# Patient Record
Sex: Female | Born: 1960 | Race: White | Hispanic: No | Smoking: Never smoker
Health system: Southern US, Community
[De-identification: ages and names within clinical notes are randomized; demographics above are authoritative.]

## PROBLEM LIST (undated history)

## (undated) DIAGNOSIS — C801 Malignant (primary) neoplasm, unspecified: Secondary | ICD-10-CM

## (undated) DIAGNOSIS — K5790 Diverticulosis of intestine, part unspecified, without perforation or abscess without bleeding: Secondary | ICD-10-CM

## (undated) DIAGNOSIS — K219 Gastro-esophageal reflux disease without esophagitis: Secondary | ICD-10-CM

## (undated) DIAGNOSIS — E7800 Pure hypercholesterolemia, unspecified: Secondary | ICD-10-CM

## (undated) DIAGNOSIS — I10 Essential (primary) hypertension: Secondary | ICD-10-CM

## (undated) DIAGNOSIS — R011 Cardiac murmur, unspecified: Secondary | ICD-10-CM

## (undated) DIAGNOSIS — T7840XA Allergy, unspecified, initial encounter: Secondary | ICD-10-CM

## (undated) HISTORY — DX: Cardiac murmur, unspecified: R01.1

## (undated) HISTORY — DX: Allergy, unspecified, initial encounter: T78.40XA

---

## 1989-06-21 HISTORY — PX: TUBAL LIGATION: SHX77

## 2004-07-20 ENCOUNTER — Ambulatory Visit: Payer: Self-pay | Admitting: Obstetrics and Gynecology

## 2005-10-25 ENCOUNTER — Ambulatory Visit: Payer: Self-pay | Admitting: Obstetrics and Gynecology

## 2006-11-17 ENCOUNTER — Ambulatory Visit: Payer: Self-pay | Admitting: Obstetrics and Gynecology

## 2007-12-07 ENCOUNTER — Ambulatory Visit: Payer: Self-pay | Admitting: Obstetrics and Gynecology

## 2008-04-14 ENCOUNTER — Ambulatory Visit: Payer: Self-pay | Admitting: Family Medicine

## 2008-11-12 ENCOUNTER — Ambulatory Visit: Payer: Self-pay | Admitting: Internal Medicine

## 2008-12-09 ENCOUNTER — Ambulatory Visit: Payer: Self-pay | Admitting: Obstetrics and Gynecology

## 2009-03-23 ENCOUNTER — Ambulatory Visit: Payer: Self-pay | Admitting: Family Medicine

## 2009-06-12 ENCOUNTER — Ambulatory Visit: Payer: Self-pay | Admitting: Internal Medicine

## 2009-06-21 HISTORY — PX: THROAT SURGERY: SHX803

## 2009-06-29 ENCOUNTER — Emergency Department: Payer: Self-pay | Admitting: Emergency Medicine

## 2009-07-22 HISTORY — PX: ESOPHAGEAL DILATION: SHX303

## 2009-07-28 ENCOUNTER — Ambulatory Visit: Payer: Self-pay | Admitting: Gastroenterology

## 2009-12-11 ENCOUNTER — Ambulatory Visit: Payer: Self-pay | Admitting: Obstetrics and Gynecology

## 2009-12-29 ENCOUNTER — Ambulatory Visit: Payer: Self-pay | Admitting: Obstetrics and Gynecology

## 2010-02-04 ENCOUNTER — Ambulatory Visit: Payer: Self-pay | Admitting: Family Medicine

## 2010-04-12 ENCOUNTER — Ambulatory Visit: Payer: Self-pay | Admitting: Internal Medicine

## 2010-05-18 ENCOUNTER — Ambulatory Visit: Payer: Self-pay | Admitting: Gastroenterology

## 2010-05-27 ENCOUNTER — Ambulatory Visit: Payer: Self-pay | Admitting: Gastroenterology

## 2010-06-21 HISTORY — PX: CHOLECYSTECTOMY: SHX55

## 2010-08-31 ENCOUNTER — Ambulatory Visit: Payer: Self-pay | Admitting: Internal Medicine

## 2010-09-03 ENCOUNTER — Ambulatory Visit: Payer: Self-pay | Admitting: Internal Medicine

## 2010-09-08 ENCOUNTER — Ambulatory Visit: Payer: Self-pay | Admitting: Internal Medicine

## 2010-09-21 ENCOUNTER — Ambulatory Visit: Payer: Self-pay | Admitting: Internal Medicine

## 2010-12-16 ENCOUNTER — Ambulatory Visit: Payer: Self-pay | Admitting: Obstetrics and Gynecology

## 2010-12-28 ENCOUNTER — Ambulatory Visit: Payer: Self-pay | Admitting: Surgery

## 2010-12-29 LAB — PATHOLOGY REPORT

## 2011-03-21 ENCOUNTER — Ambulatory Visit: Payer: Self-pay

## 2011-12-17 ENCOUNTER — Ambulatory Visit: Payer: Self-pay | Admitting: Obstetrics and Gynecology

## 2012-12-27 ENCOUNTER — Ambulatory Visit: Payer: Self-pay | Admitting: Obstetrics and Gynecology

## 2013-03-08 ENCOUNTER — Ambulatory Visit: Payer: Self-pay | Admitting: Medical

## 2013-03-12 ENCOUNTER — Ambulatory Visit: Payer: Self-pay | Admitting: Medical

## 2013-12-06 LAB — TSH: TSH: 2.98 u[IU]/mL (ref <=5.90)

## 2013-12-06 LAB — LIPID PANEL
Cholesterol: 198 mg/dL (ref 0–200)
HDL: 68 mg/dL (ref 35–70)
LDL Cholesterol: 89 mg/dL
Triglycerides: 203 mg/dL — AB (ref 40–160)

## 2013-12-06 LAB — BASIC METABOLIC PANEL
BUN: 13 mg/dL (ref 4–21)
Creatinine: 0.8 mg/dL (ref <=1.1)

## 2014-01-02 ENCOUNTER — Ambulatory Visit: Payer: Self-pay | Admitting: Obstetrics and Gynecology

## 2014-01-18 ENCOUNTER — Ambulatory Visit: Payer: Self-pay | Admitting: Medical

## 2014-08-25 ENCOUNTER — Ambulatory Visit: Payer: Self-pay | Admitting: Registered Nurse

## 2014-11-28 ENCOUNTER — Other Ambulatory Visit: Payer: Self-pay

## 2014-11-28 ENCOUNTER — Encounter: Payer: Self-pay | Admitting: Internal Medicine

## 2014-11-28 DIAGNOSIS — K5730 Diverticulosis of large intestine without perforation or abscess without bleeding: Secondary | ICD-10-CM | POA: Insufficient documentation

## 2014-11-28 DIAGNOSIS — I7300 Raynaud's syndrome without gangrene: Secondary | ICD-10-CM | POA: Insufficient documentation

## 2014-11-28 DIAGNOSIS — J3089 Other allergic rhinitis: Secondary | ICD-10-CM | POA: Insufficient documentation

## 2014-11-28 DIAGNOSIS — M19049 Primary osteoarthritis, unspecified hand: Secondary | ICD-10-CM | POA: Insufficient documentation

## 2014-11-28 DIAGNOSIS — G5600 Carpal tunnel syndrome, unspecified upper limb: Secondary | ICD-10-CM | POA: Insufficient documentation

## 2014-11-28 DIAGNOSIS — I10 Essential (primary) hypertension: Secondary | ICD-10-CM | POA: Insufficient documentation

## 2014-11-28 DIAGNOSIS — I8390 Asymptomatic varicose veins of unspecified lower extremity: Secondary | ICD-10-CM

## 2014-11-28 DIAGNOSIS — I83813 Varicose veins of bilateral lower extremities with pain: Secondary | ICD-10-CM | POA: Insufficient documentation

## 2014-11-28 DIAGNOSIS — E785 Hyperlipidemia, unspecified: Secondary | ICD-10-CM | POA: Insufficient documentation

## 2014-11-28 DIAGNOSIS — K635 Polyp of colon: Secondary | ICD-10-CM | POA: Insufficient documentation

## 2014-11-29 ENCOUNTER — Encounter: Payer: Self-pay | Admitting: Internal Medicine

## 2014-11-29 ENCOUNTER — Other Ambulatory Visit: Payer: Self-pay

## 2014-11-29 ENCOUNTER — Ambulatory Visit (INDEPENDENT_AMBULATORY_CARE_PROVIDER_SITE_OTHER): Payer: BC Managed Care – PPO | Admitting: Internal Medicine

## 2014-11-29 VITALS — BP 96/60 | HR 64 | Ht 65.0 in | Wt 170.4 lb

## 2014-11-29 DIAGNOSIS — E785 Hyperlipidemia, unspecified: Secondary | ICD-10-CM

## 2014-11-29 DIAGNOSIS — I10 Essential (primary) hypertension: Secondary | ICD-10-CM

## 2014-11-29 DIAGNOSIS — M722 Plantar fascial fibromatosis: Secondary | ICD-10-CM

## 2014-11-29 DIAGNOSIS — E559 Vitamin D deficiency, unspecified: Secondary | ICD-10-CM | POA: Diagnosis not present

## 2014-11-29 DIAGNOSIS — N289 Disorder of kidney and ureter, unspecified: Secondary | ICD-10-CM | POA: Diagnosis not present

## 2014-11-29 HISTORY — DX: Plantar fascial fibromatosis: M72.2

## 2014-11-29 NOTE — Progress Notes (Signed)
Date:  11/29/2014   Name:  Holly Villegas   DOB:  [birth year]   MRN:  [id number]   Chief Complaint: Medication Refill   History of Present Illness:  This is a 54 y.o. female who is presenting for lab monitoring due to medication prescribed by podiatry. Leg Pain  There was no injury mechanism (chronic plantar fasciitis). The pain is present in the left leg and right leg. The quality of the pain is described as shooting and stabbing. The pain is at a severity of 3/10. The pain is moderate. The pain has been improving since onset. She has tried NSAIDs (Podiatry does not want to prescribe etodolac ongoing without lab monitoring.  Her appt is next week.) for the symptoms. The treatment provided moderate relief.  Hypertension This is a chronic problem. The current episode started more than 1 year ago. The problem is unchanged. The problem is controlled. Pertinent negatives include no chest pain, headaches or shortness of breath. There are no known risk factors for coronary artery disease. Past treatments include ACE inhibitors and diuretics. There are no compliance problems.  There is no history of chronic renal disease.  Hyperlipidemia This is a chronic problem. The problem is controlled. Recent lipid tests were reviewed and are normal. She has no history of chronic renal disease, diabetes or hypothyroidism. There are no known factors aggravating her hyperlipidemia. Associated symptoms include leg pain. Pertinent negatives include no chest pain, focal weakness, myalgias or shortness of breath. Current antihyperlipidemic treatment includes statins. The current treatment provides significant improvement of lipids. There are no compliance problems.    Review of Systems:  Review of Systems  Constitutional: Negative for fever, appetite change and unexpected weight change.  Respiratory: Negative for cough and shortness of breath.   Cardiovascular: Negative for chest pain and leg swelling.   Gastrointestinal: Negative for nausea, abdominal pain and blood in stool.  Musculoskeletal: Positive for arthralgias (bilateral feet). Negative for myalgias.  Neurological: Negative for dizziness, focal weakness and headaches.  Hematological: Does not bruise/bleed easily.  Psychiatric/Behavioral: Negative for dysphoric mood.    Patient Active Problem List   Diagnosis Date Noted  . Plantar fasciitis, bilateral 11/29/2014  . Vitamin D deficiency 11/29/2014  . Dyslipidemia 11/28/2014  . Varicose veins 11/28/2014  . Environmental and seasonal allergies 11/28/2014  . Raynaud's syndrome without gangrene 11/28/2014  . Carpal tunnel syndrome 11/28/2014  . Benign colonic polyp 11/28/2014  . Diverticulosis of colon without diverticulitis 11/28/2014  . Essential hypertension 11/28/2014  . Impaired renal function 11/28/2014    Prior to Admission medications   Medication Sig Start Date End Date Taking? Authorizing Provider  atorvastatin (LIPITOR) 20 MG tablet Take 1 tablet by mouth daily. 04/12/14   Historical Provider, MD  azelastine (ASTELIN) 0.1 % nasal spray Place 1 spray into the nose daily. 08/12/14   Historical Provider, MD  esomeprazole (NEXIUM) 40 MG capsule Take 1 tablet by mouth daily. 06/27/13   Historical Provider, MD  etodolac (LODINE XL) 400 MG 24 hr tablet Take 1 tablet by mouth daily.    Historical Provider, MD  fluticasone (FLONASE) 50 MCG/ACT nasal spray Place 2 sprays into the nose daily. 12/06/13   Historical Provider, MD  lisinopril-hydrochlorothiazide (PRINZIDE,ZESTORETIC) 20-25 MG per tablet Take 1 tablet by mouth daily. 12/31/13   Historical Provider, MD  Norethindrone-Ethinyl Estradiol-Fe Biphas (LO LOESTRIN FE) 1 MG-10 MCG / 10 MCG tablet Take 1 tablet by mouth daily.    Historical Provider, MD  Vitamin D, Ergocalciferol, (DRISDOL) 50000 UNITS  CAPS capsule Take by mouth.    Historical Provider, MD    Allergies  Allergen Reactions  . Codeine Sulfate Nausea And Vomiting   . Shellfish-Derived Products   . Sulfa Antibiotics Nausea And Vomiting    Past Surgical History  Procedure Laterality Date  . Throat surgery  2011    polyp removed from vocal cord  . Esophageal dilation  07/2009  . Cholecystectomy  2012  . Tubal ligation  1991    History  Substance Use Topics  . Smoking status: Never Smoker   . Smokeless tobacco: Not on file  . Alcohol Use: 0.0 oz/week    0 Standard drinks or equivalent per week     Comment: social   Lab Results  Component Value Date   CREATININE 0.8 12/06/2013     Medication list has been reviewed and updated.  Physical Examination:  Physical Exam  Constitutional: She appears well-developed and well-nourished.  Eyes: Conjunctivae are normal.  Neck: Normal range of motion. Neck supple. No thyromegaly present.  Cardiovascular: Normal rate, regular rhythm and normal heart sounds.   Pulmonary/Chest: Breath sounds normal. She has no wheezes.  Abdominal: Soft. Bowel sounds are normal. She exhibits no mass. There is no tenderness. There is no guarding.  Musculoskeletal: She exhibits no edema.  Lymphadenopathy:    She has no cervical adenopathy.  Skin: Skin is warm.  Psychiatric: She has a normal mood and affect. Her behavior is normal. Thought content normal.  Nursing note and vitals reviewed.   BP 96/60 mmHg  Pulse 64  Ht 5\' 5"  (1.651 m)  Wt 170 lb 6.4 oz (77.293 kg)  BMI 28.36 kg/m2  Assessment and Plan:  1. Plantar fasciitis, bilateral Can continue current medication  2. Dyslipidemia On statin therapy - Lipid panel  3. Impaired renal function Noted in the past - will recheck labs before continuing nsaids - Comprehensive metabolic panel  4. Essential hypertension The current medical regimen is effective;  continue present plan and medications.  - CBC with Differential/Platelet  5. Vitamin D deficiency Hx of very low reading - will advise on supplements when resulted - Vitamin D 1,25  dihydroxy   Halina Maidens, MD [facility]  11/29/2014

## 2014-11-30 LAB — COMPREHENSIVE METABOLIC PANEL
ALT: 13 IU/L (ref 0–32)
AST: 16 IU/L (ref 0–40)
Albumin/Globulin Ratio: 1.9 (ref 1.1–2.5)
Albumin: 4.5 g/dL (ref 3.5–5.5)
Alkaline Phosphatase: 73 IU/L (ref 39–117)
BUN/Creatinine Ratio: 23 (ref 9–23)
BUN: 18 mg/dL (ref 6–24)
Bilirubin Total: 0.3 mg/dL (ref 0.0–1.2)
CHLORIDE: 98 mmol/L (ref 97–108)
CO2: 24 mmol/L (ref 18–29)
Calcium: 9.5 mg/dL (ref 8.7–10.2)
Creatinine, Ser: 0.8 mg/dL (ref 0.57–1.00)
GFR, EST AFRICAN AMERICAN: 95 mL/min/{1.73_m2} (ref >59)
GFR, EST NON AFRICAN AMERICAN: 83 mL/min/{1.73_m2} (ref >59)
GLOBULIN, TOTAL: 2.4 g/dL (ref 1.5–4.5)
Glucose: 97 mg/dL (ref 65–99)
POTASSIUM: 4.3 mmol/L (ref 3.5–5.2)
Sodium: 139 mmol/L (ref 134–144)
TOTAL PROTEIN: 6.9 g/dL (ref 6.0–8.5)

## 2014-11-30 LAB — LIPID PANEL
CHOL/HDL RATIO: 2.3 ratio (ref 0.0–4.4)
CHOLESTEROL TOTAL: 183 mg/dL (ref 100–199)
HDL: 79 mg/dL (ref >39)
LDL Calculated: 81 mg/dL (ref 0–99)
TRIGLYCERIDES: 114 mg/dL (ref 0–149)
VLDL Cholesterol Cal: 23 mg/dL (ref 5–40)

## 2014-11-30 LAB — CBC WITH DIFFERENTIAL/PLATELET
BASOS ABS: 0.1 10*3/uL (ref 0.0–0.2)
Basos: 1 %
EOS (ABSOLUTE): 0.1 10*3/uL (ref 0.0–0.4)
EOS: 1 %
Hematocrit: 41.1 % (ref 34.0–46.6)
Hemoglobin: 13.7 g/dL (ref 11.1–15.9)
IMMATURE GRANULOCYTES: 0 %
Immature Grans (Abs): 0 10*3/uL (ref 0.0–0.1)
LYMPHS: 39 %
Lymphocytes Absolute: 2.4 10*3/uL (ref 0.7–3.1)
MCH: 29.5 pg (ref 26.6–33.0)
MCHC: 33.3 g/dL (ref 31.5–35.7)
MCV: 88 fL (ref 79–97)
Monocytes Absolute: 0.5 10*3/uL (ref 0.1–0.9)
Monocytes: 8 %
Neutrophils Absolute: 3 10*3/uL (ref 1.4–7.0)
Neutrophils: 51 %
Platelets: 311 10*3/uL (ref 150–379)
RBC: 4.65 x10E6/uL (ref 3.77–5.28)
RDW: 13.2 % (ref 12.3–15.4)
WBC: 6 10*3/uL (ref 3.4–10.8)

## 2014-12-06 LAB — VITAMIN D 1,25 DIHYDROXY
Vitamin D 1, 25 (OH)2 Total: 42 pg/mL
Vitamin D2 1, 25 (OH)2: <10 pg/mL
Vitamin D3 1, 25 (OH)2: 42 pg/mL

## 2014-12-11 ENCOUNTER — Other Ambulatory Visit: Payer: Self-pay | Admitting: Obstetrics and Gynecology

## 2014-12-11 DIAGNOSIS — Z1231 Encounter for screening mammogram for malignant neoplasm of breast: Secondary | ICD-10-CM

## 2014-12-16 ENCOUNTER — Encounter: Payer: BC Managed Care – PPO | Admitting: Internal Medicine

## 2015-01-14 ENCOUNTER — Ambulatory Visit: Payer: Self-pay

## 2015-01-16 ENCOUNTER — Ambulatory Visit
Admission: RE | Admit: 2015-01-16 | Discharge: 2015-01-16 | Disposition: A | Discharge status: Home / Self Care | Payer: BC Managed Care – PPO | Source: Ambulatory Visit | Attending: Obstetrics and Gynecology | Admitting: Obstetrics and Gynecology

## 2015-01-16 DIAGNOSIS — Z1231 Encounter for screening mammogram for malignant neoplasm of breast: Secondary | ICD-10-CM | POA: Diagnosis not present

## 2015-01-31 ENCOUNTER — Other Ambulatory Visit: Payer: Self-pay | Admitting: Internal Medicine

## 2015-03-14 ENCOUNTER — Ambulatory Visit (INDEPENDENT_AMBULATORY_CARE_PROVIDER_SITE_OTHER): Payer: BC Managed Care – PPO | Admitting: Internal Medicine

## 2015-03-14 ENCOUNTER — Encounter: Payer: Self-pay | Admitting: Internal Medicine

## 2015-03-14 VITALS — BP 104/62 | HR 60 | Ht 65.0 in | Wt 183.2 lb

## 2015-03-14 DIAGNOSIS — J0100 Acute maxillary sinusitis, unspecified: Secondary | ICD-10-CM | POA: Diagnosis not present

## 2015-03-14 MED ORDER — CETIRIZINE HCL 10 MG PO TABS: 10.0000 mg | ORAL_TABLET | Freq: Every day | ORAL | Status: DC

## 2015-03-14 MED ORDER — AMOXICILLIN-POT CLAVULANATE 875-125 MG PO TABS: 1.0000 | ORAL_TABLET | Freq: Two times a day (BID) | ORAL | Status: DC

## 2015-03-14 NOTE — Progress Notes (Signed)
Date:  03/14/2015   Name:  Holly Villegas   DOB:  Feb 03, 1961   MRN:  [id number]   Chief Complaint: Sinusitis  Patient has onset of sinus pressure and drainage about 1 week ago. She frequently gets sinus infections and this feels similar. She's been using nasal spray and Mucinex. 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 She complaining of sinus pressure and ear pressure sore throat and postnasal drip and nonproductive cough. Nasal discharge is thick and green. Her sleep is disturbed by cough and postnasal drainage.  Review of Systems:  Review of Systems  Constitutional: Positive for fatigue. Negative for fever.  HENT: Positive for congestion, postnasal drip, sinus pressure and sore throat.   Respiratory: Positive for cough. Negative for chest tightness and wheezing.   Cardiovascular: Negative for chest pain and palpitations.  Neurological: Positive for headaches. Negative for dizziness.    Patient Active Problem List   Diagnosis Date Noted  . Plantar fasciitis, bilateral 11/29/2014  . Vitamin D deficiency 11/29/2014  . Dyslipidemia 11/28/2014  . Varicose veins 11/28/2014  . Environmental and seasonal allergies 11/28/2014  . Raynaud's syndrome without gangrene 11/28/2014  . Carpal tunnel syndrome 11/28/2014  . Benign colonic polyp 11/28/2014  . Diverticulosis of colon without diverticulitis 11/28/2014  . Essential hypertension 11/28/2014  . Impaired renal function 11/28/2014    Prior to Admission medications   Medication Sig Start Date End Date Taking? Authorizing Provider  acetaminophen (TYLENOL) 500 MG tablet Take 1 tablet by mouth once as needed.   Yes Historical Provider, MD  atorvastatin (LIPITOR) 20 MG tablet Take 1 tablet by mouth daily. 04/12/14  Yes Historical Provider, MD  azelastine (ASTELIN) 0.1 % nasal spray Place 1 spray into the nose daily. 08/12/14  Yes Historical Provider, MD  esomeprazole (NEXIUM) 40 MG capsule Take 1  tablet by mouth daily. 06/27/13  Yes Historical Provider, MD  etodolac (LODINE XL) 400 MG 24 hr tablet Take 1 tablet by mouth daily.   Yes Historical Provider, MD  fluticasone (FLONASE) 50 MCG/ACT nasal spray Place 2 sprays into the nose daily. 12/06/13  Yes Historical Provider, MD  lisinopril-hydrochlorothiazide (PRINZIDE,ZESTORETIC) 20-25 MG per tablet TAKE (1) TABLET BY MOUTH EVERY DAY 02/01/15  Yes Glean Hess, MD  Multiple Vitamin (MULTI-VITAMINS) TABS Take 1 tablet by mouth daily.   Yes Historical Provider, MD  Norethindrone-Ethinyl Estradiol-Fe Biphas (LO LOESTRIN FE) 1 MG-10 MCG / 10 MCG tablet Take 1 tablet by mouth daily.    Historical Provider, MD    Allergies  Allergen Reactions  . Codeine Sulfate Nausea And Vomiting  . Shellfish-Derived Products   . Sulfa Antibiotics Nausea And Vomiting    Past Surgical History  Procedure Laterality Date  . Throat surgery  2011    polyp removed from vocal cord  . Esophageal dilation  07/2009  . Cholecystectomy  2012  . Tubal ligation  1991    Social History  Substance Use Topics  . Smoking status: Never Smoker   . Smokeless tobacco: None  . Alcohol Use: 0.0 oz/week    0 Standard drinks or equivalent per week     Comment: social     Medication list has been reviewed and updated.  Physical Examination:  Physical Exam  Constitutional: She is oriented to person, place, and time. Vital signs are normal. She appears well-developed and well-nourished. She has a sickly appearance.  HENT:  Right Ear: External ear normal.  Left Ear: External ear and ear canal  normal. Tympanic membrane is retracted. Tympanic membrane is not erythematous.  Nose: Right sinus exhibits maxillary sinus tenderness and frontal sinus tenderness. Left sinus exhibits maxillary sinus tenderness and frontal sinus tenderness.  Mouth/Throat: Uvula is midline and mucous membranes are normal. No oral lesions. Posterior oropharyngeal erythema present. No oropharyngeal  exudate.  Cardiovascular: Normal rate, regular rhythm and normal heart sounds.   Pulmonary/Chest: Effort normal. She has decreased breath sounds in the right upper field and the left upper field. She has no wheezes. She has no rales.  Lymphadenopathy:    She has no cervical adenopathy.  Neurological: She is alert and oriented to person, place, and time.    BP 104/62 mmHg  Pulse 60  Ht 5\' 5"  (1.651 m)  Wt 183 lb 3.2 oz (83.099 kg)  BMI 30.49 kg/m2  Assessment and Plan: 1. Acute maxillary sinusitis, recurrence not specified Continue nasal spray, fluids, and over-the-counter cough syrup resume Zyrtec 1 tab daily for 5-7 days - amoxicillin-clavulanate (AUGMENTIN) 875-125 MG per tablet; Take 1 tablet by mouth 2 (two) times daily.  Dispense: 20 tablet; Refill: 0 - cetirizine (ZYRTEC) 10 MG tablet; Take 1 tablet (10 mg total) by mouth daily.  Dispense: 30 tablet; Refill: [place], MD [facility]  03/14/2015

## 2015-05-02 ENCOUNTER — Other Ambulatory Visit: Payer: Self-pay | Admitting: Internal Medicine

## 2015-05-14 ENCOUNTER — Ambulatory Visit (INDEPENDENT_AMBULATORY_CARE_PROVIDER_SITE_OTHER): Payer: BC Managed Care – PPO | Admitting: Internal Medicine

## 2015-05-14 ENCOUNTER — Encounter: Payer: Self-pay | Admitting: Internal Medicine

## 2015-05-14 VITALS — BP 104/76 | HR 72 | Ht 65.0 in | Wt 189.6 lb

## 2015-05-14 DIAGNOSIS — N289 Disorder of kidney and ureter, unspecified: Secondary | ICD-10-CM

## 2015-05-14 DIAGNOSIS — E559 Vitamin D deficiency, unspecified: Secondary | ICD-10-CM | POA: Diagnosis not present

## 2015-05-14 DIAGNOSIS — E785 Hyperlipidemia, unspecified: Secondary | ICD-10-CM

## 2015-05-14 DIAGNOSIS — I10 Essential (primary) hypertension: Secondary | ICD-10-CM

## 2015-05-14 NOTE — Progress Notes (Signed)
Date:  05/14/2015   Name:  Holly Villegas   DOB:  May 17, 1961   MRN:  [id number]   Chief Complaint: Hyperlipidemia and Hypertension Hyperlipidemia This is a chronic (has not has labs in 18 months) problem. The current episode started more than 1 year ago. The problem is controlled. Recent lipid tests were reviewed and are normal. Pertinent negatives include no chest pain, focal weakness, leg pain, myalgias or shortness of breath. Current antihyperlipidemic treatment includes statins. The current treatment provides significant improvement of lipids. There are no compliance problems.   Hypertension This is a chronic problem. The current episode started more than 1 year ago. The problem is unchanged. The problem is controlled. Pertinent negatives include no chest pain, headaches, palpitations or shortness of breath. The current treatment provides significant improvement. There are no compliance problems.   Gastroesophageal Reflux She complains of heartburn. She reports no abdominal pain, no chest pain, no hoarse voice, no nausea, no sore throat or no wheezing. This is a chronic problem. The problem occurs rarely. The problem has been unchanged. The heartburn is located in the substernum. The heartburn does not wake her from sleep. The heartburn does not limit her activity. The heartburn doesn't change with position. Pertinent negatives include no fatigue. She has tried a PPI for the symptoms. The treatment provided significant relief.   low vitamin D - patient history of very low vitamin D levels. She took high-dose supplements weekly for a number of months. Recheck level showed that these were normal but she did not continue a daily maintenance dose.   Review of Systems  Constitutional: Negative for fever, chills and fatigue.  HENT: Negative for ear pain, hoarse voice, sore throat, tinnitus and trouble swallowing.   Eyes: Negative for visual disturbance.  Respiratory: Negative for chest  tightness, shortness of breath and wheezing.   Cardiovascular: Negative for chest pain and palpitations.  Gastrointestinal: Positive for heartburn. Negative for nausea and abdominal pain.  Genitourinary: Negative for dysuria.  Musculoskeletal: Negative for myalgias.  Skin: Negative for rash.  Allergic/Immunologic: Negative for food allergies.  Neurological: Negative for dizziness, focal weakness, numbness and headaches.  Psychiatric/Behavioral: Negative for sleep disturbance and dysphoric mood.    Patient Active Problem List   Diagnosis Date Noted  . Plantar fasciitis, bilateral 11/29/2014  . Vitamin D deficiency 11/29/2014  . Dupuytren's contracture of foot 11/29/2014  . Dyslipidemia 11/28/2014  . Varicose veins 11/28/2014  . Environmental and seasonal allergies 11/28/2014  . Raynaud's syndrome without gangrene 11/28/2014  . Carpal tunnel syndrome 11/28/2014  . Benign colonic polyp 11/28/2014  . Diverticulosis of colon without diverticulitis 11/28/2014  . Essential hypertension 11/28/2014  . Impaired renal function 11/28/2014  . Paroxysmal digital cyanosis 11/28/2014  . Other allergic rhinitis 11/28/2014  . Colon, diverticulosis 11/28/2014  . Asymptomatic varicose veins 11/28/2014    Prior to Admission medications   Medication Sig Start Date End Date Taking? Authorizing Provider  acetaminophen (TYLENOL) 500 MG tablet Take 1 tablet by mouth once as needed.   Yes Historical Provider, MD  atorvastatin (LIPITOR) 20 MG tablet TAKE (1) TABLET BY MOUTH EVERY DAY 05/02/15  Yes Glean Hess, MD  cetirizine (ZYRTEC) 10 MG tablet Take 1 tablet (10 mg total) by mouth daily. 03/14/15  Yes Glean Hess, MD  esomeprazole (NEXIUM) 40 MG capsule Take 1 tablet by mouth daily. 06/27/13  Yes Historical Provider, MD  fluticasone (FLONASE) 50 MCG/ACT nasal spray Place 2 sprays into the nose daily. 12/06/13  Yes Historical Provider,  MD  lisinopril-hydrochlorothiazide (PRINZIDE,ZESTORETIC) 20-25  MG tablet TAKE ONE (1) TABLET BY MOUTH ONCE DAILY 05/02/15  Yes Glean Hess, MD  Multiple Vitamin (MULTI-VITAMINS) TABS Take 1 tablet by mouth daily.   Yes Historical Provider, MD  azelastine (ASTELIN) 0.1 % nasal spray Place 1 spray into the nose daily. 08/12/14   Historical Provider, MD  Norethindrone-Ethinyl Estradiol-Fe Biphas (LO LOESTRIN FE) 1 MG-10 MCG / 10 MCG tablet Take 1 tablet by mouth daily.    Historical Provider, MD    Allergies  Allergen Reactions  . Codeine Sulfate Nausea And Vomiting  . Shellfish-Derived Products   . Sulfa Antibiotics Nausea And Vomiting    Past Surgical History  Procedure Laterality Date  . Throat surgery  2011    polyp removed from vocal cord  . Esophageal dilation  07/2009  . Cholecystectomy  2012  . Tubal ligation  1991    Social History  Substance Use Topics  . Smoking status: Never Smoker   . Smokeless tobacco: None  . Alcohol Use: 0.0 oz/week    0 Standard drinks or equivalent per week     Comment: social    Medication list has been reviewed and updated.   Physical Exam  Constitutional: She is oriented to person, place, and time. She appears well-developed and well-nourished. No distress.  HENT:  Head: Normocephalic and atraumatic.  Eyes: Conjunctivae are normal. Right eye exhibits no discharge. Left eye exhibits no discharge. No scleral icterus.  Cardiovascular: Normal rate, regular rhythm and normal heart sounds.   Pulmonary/Chest: Effort normal and breath sounds normal. No respiratory distress.  Abdominal: Soft. Normal appearance and bowel sounds are normal. There is no hepatosplenomegaly. There is no tenderness.  Musculoskeletal: Normal range of motion. She exhibits no edema.  Neurological: She is alert and oriented to person, place, and time.  Skin: Skin is warm and dry. No rash noted.  Psychiatric: She has a normal mood and affect. Her behavior is normal. Thought content normal.    BP 104/76 mmHg  Pulse 72  Ht 5\' 5"   (Q000111Q m)  Wt 189 lb 9.6 oz (86.002 kg)  BMI 31.55 kg/m2  Assessment and Plan: 1. Essential hypertension Well-controlled - CBC with Differential/Platelet - Comprehensive metabolic panel  2. Dyslipidemia Continue statin - Lipid panel - TSH  3. Impaired renal function Will monitor regularly - Comprehensive metabolic panel  4. Vitamin D deficiency Check level and advise on resumption of supplements - Vitamin D 1,25 dihydroxy   Halina Maidens, MD [facility]  05/14/2015

## 2015-05-15 LAB — CBC WITH DIFFERENTIAL/PLATELET
BASOS ABS: 0.1 10*3/uL (ref 0.0–0.2)
Basos: 1 %
EOS (ABSOLUTE): 0.2 10*3/uL (ref 0.0–0.4)
Eos: 3 %
HEMOGLOBIN: 13 g/dL (ref 11.1–15.9)
Hematocrit: 38.3 % (ref 34.0–46.6)
IMMATURE GRANS (ABS): 0 10*3/uL (ref 0.0–0.1)
IMMATURE GRANULOCYTES: 0 %
Lymphocytes Absolute: 2.7 10*3/uL (ref 0.7–3.1)
Lymphs: 33 %
MCH: 30.4 pg (ref 26.6–33.0)
MCHC: 33.9 g/dL (ref 31.5–35.7)
MCV: 90 fL (ref 79–97)
MONOCYTES: 7 %
Monocytes Absolute: 0.6 10*3/uL (ref 0.1–0.9)
NEUTROS PCT: 56 %
Neutrophils Absolute: 4.7 10*3/uL (ref 1.4–7.0)
PLATELETS: 282 10*3/uL (ref 150–379)
RBC: 4.28 x10E6/uL (ref 3.77–5.28)
RDW: 13.4 % (ref 12.3–15.4)
WBC: 8.3 10*3/uL (ref 3.4–10.8)

## 2015-05-15 LAB — COMPREHENSIVE METABOLIC PANEL
ALBUMIN: 4 g/dL (ref 3.5–5.5)
ALT: 12 IU/L (ref 0–32)
AST: 17 IU/L (ref 0–40)
Albumin/Globulin Ratio: 1.5 (ref 1.1–2.5)
Alkaline Phosphatase: 74 IU/L (ref 39–117)
BUN/Creatinine Ratio: 16 (ref 9–23)
BUN: 14 mg/dL (ref 6–24)
Bilirubin Total: 0.2 mg/dL (ref 0.0–1.2)
CO2: 22 mmol/L (ref 18–29)
CREATININE: 0.9 mg/dL (ref 0.57–1.00)
Calcium: 8.7 mg/dL (ref 8.7–10.2)
Chloride: 100 mmol/L (ref 97–106)
GFR calc non Af Amer: 72 mL/min/{1.73_m2} (ref >59)
GFR, EST AFRICAN AMERICAN: 83 mL/min/{1.73_m2} (ref >59)
GLUCOSE: 86 mg/dL (ref 65–99)
Globulin, Total: 2.6 g/dL (ref 1.5–4.5)
Potassium: 4.6 mmol/L (ref 3.5–5.2)
Sodium: 141 mmol/L (ref 136–144)
Total Protein: 6.6 g/dL (ref 6.0–8.5)

## 2015-05-15 LAB — LIPID PANEL
Chol/HDL Ratio: 2.2 ratio units (ref 0.0–4.4)
Cholesterol, Total: 181 mg/dL (ref 100–199)
HDL: 84 mg/dL (ref >39)
LDL CALC: 72 mg/dL (ref 0–99)
Triglycerides: 125 mg/dL (ref 0–149)
VLDL CHOLESTEROL CAL: 25 mg/dL (ref 5–40)

## 2015-05-15 LAB — TSH: TSH: 3.04 u[IU]/mL (ref 0.450–4.500)

## 2015-05-22 LAB — VITAMIN D 1,25 DIHYDROXY
VITAMIN D 1, 25 (OH) TOTAL: 46 pg/mL
Vitamin D2 1, 25 (OH)2: <10 pg/mL
Vitamin D3 1, 25 (OH)2: 41 pg/mL

## 2015-05-24 ENCOUNTER — Ambulatory Visit
Admission: EM | Admit: 2015-05-24 | Discharge: 2015-05-24 | Disposition: A | Discharge status: Home / Self Care | Payer: BC Managed Care – PPO | Attending: Family Medicine | Admitting: Family Medicine

## 2015-05-24 DIAGNOSIS — J0101 Acute recurrent maxillary sinusitis: Secondary | ICD-10-CM | POA: Diagnosis not present

## 2015-05-24 MED ORDER — AMOXICILLIN-POT CLAVULANATE 875-125 MG PO TABS: 1.0000 | ORAL_TABLET | Freq: Two times a day (BID) | ORAL | Status: DC

## 2015-05-24 MED ORDER — BUDESONIDE 32 MCG/ACT NA SUSP: 2.0000 | Freq: Every day | NASAL | Status: DC

## 2015-05-24 NOTE — ED Provider Notes (Signed)
CSN: [id number]     Arrival date & time 05/24/15  [id number] History   First MD Initiated Contact with Patient 05/24/15 [phone]     Chief Complaint  Patient presents with  . Sinus Congestion    HPI  Holly Villegas is a pleasant 54 y.o. female who presents with acute sinus pain and pressure. She states one week ago she developed severe maxillary sinus pain. She has a history of recurrent sinusitis gets infection several times per year. She's not been seen by anterior recently. She has been using an Nettie pot twice daily, Mucinex at least once a day, and NyQuil at night. She complains of sinus congestion and rhinorrhea. She has noticed erythema on her face or in her maxillary sinus area. She is a Radio producer. She also tried ibuprofen which did not help. Pain is 9/10 at worst today. Rest seems to make the pain better. Nothing aggravates the pain.  She denies fever.  Last treatment for sinusitis was in September.  No past medical history on file. Past Surgical History  Procedure Laterality Date  . Throat surgery  2011    polyp removed from vocal cord  . Esophageal dilation  07/2009  . Cholecystectomy  2012  . Tubal ligation  1991   Family History  Problem Relation Age of Onset  . Hypertension Mother   . Migraines Mother   . Peptic Ulcer Disease Father   . Breast cancer Maternal Aunt 46   Social History  Substance Use Topics  . Smoking status: Never Smoker   . Smokeless tobacco: None  . Alcohol Use: 0.0 oz/week    0 Standard drinks or equivalent per week     Comment: social   OB History    No data available     Review of Systems  Constitutional: Positive for diaphoresis, activity change, appetite change and fatigue. Negative for fever and chills.  HENT: Positive for congestion, postnasal drip, rhinorrhea, sinus pressure and sneezing. Negative for ear discharge, ear pain, facial swelling, mouth sores, sore throat and tinnitus.   Eyes: Negative.   Respiratory: Negative.    Cardiovascular: Negative.   Gastrointestinal: Negative.   Endocrine: Negative.   Genitourinary: Negative.   Musculoskeletal: Negative.   Skin: Negative.   Allergic/Immunologic: Negative.   Neurological: Negative.   Hematological: Negative.   Psychiatric/Behavioral: Negative.     Allergies  Codeine sulfate; Shellfish-derived products; and Sulfa antibiotics  Home Medications   Prior to Admission medications   Medication Sig Start Date End Date Taking? Authorizing Provider  acetaminophen (TYLENOL) 500 MG tablet Take 1 tablet by mouth once as needed.   Yes Historical Provider, MD  atorvastatin (LIPITOR) 20 MG tablet TAKE (1) TABLET BY MOUTH EVERY DAY 05/02/15  Yes Glean Hess, MD  cetirizine (ZYRTEC) 10 MG tablet Take 1 tablet (10 mg total) by mouth daily. 03/14/15  Yes Glean Hess, MD  esomeprazole (NEXIUM) 40 MG capsule Take 1 tablet by mouth daily. 06/27/13  Yes Historical Provider, MD  lisinopril-hydrochlorothiazide (PRINZIDE,ZESTORETIC) 20-25 MG tablet TAKE ONE (1) TABLET BY MOUTH ONCE DAILY 05/02/15  Yes Glean Hess, MD  Multiple Vitamin (MULTI-VITAMINS) TABS Take 1 tablet by mouth daily.   Yes Historical Provider, MD  Norethindrone-Ethinyl Estradiol-Fe Biphas (LO LOESTRIN FE) 1 MG-10 MCG / 10 MCG tablet Take 1 tablet by mouth daily.   Yes Historical Provider, MD  amoxicillin-clavulanate (AUGMENTIN) 875-125 MG tablet Take 1 tablet by mouth 2 (two) times daily. 05/24/15   Andria Meuse, NP  budesonide (RHINOCORT AQUA) 32 MCG/ACT nasal spray Place 2 sprays into both nostrils daily. 05/24/15   Andria Meuse, NP   Meds Ordered and Administered this Visit  Medications - No data to display  BP 124/82 mmHg  Pulse 63  Temp(Src) 98 F (36.7 C) (Tympanic)  Resp 17  Ht 5\' 5"  (1.651 m)  Wt 185 lb (83.915 kg)  BMI 30.79 kg/m2  SpO2 100% No data found.   Physical Exam  HENT:  Head: Normocephalic.  Right Ear: Hearing and ear canal normal. Tympanic membrane is  injected, erythematous and bulging.  Left Ear: Hearing and ear canal normal. Tympanic membrane is injected, erythematous and bulging.  Nose: Mucosal edema and rhinorrhea present. Right sinus exhibits maxillary sinus tenderness and frontal sinus tenderness. Left sinus exhibits maxillary sinus tenderness and frontal sinus tenderness.  Mouth/Throat: Uvula is midline, oropharynx is clear and moist and mucous membranes are normal.  Nursing note and vitals reviewed.   ED Course  Procedures n/a  MDM   1. Acute recurrent maxillary sinusitis    Plan: Diagnosis reviewed with patient.  She is advised to follow-up with her PCP in 1 week given recurrent sinusitis and may need referral to otolaryngologist. Rx as per orders;  benefits, risks, potential side effects reviewed  Recommend supportive treatment with Use ibuprofen 600-800mg  with food 3 times daily Use rhinocort spray, mucinex twice daily Rest, plenty fluids      Andria Meuse, NP 05/24/15 [phone]

## 2015-05-24 NOTE — Discharge Instructions (Signed)
Use ibuprofen 600-800mg  with food 3 times daily Use rhinocort spray, mucinex twice daily Rest, plenty fluids  Sinusitis, Adult Sinusitis is redness, soreness, and inflammation of the paranasal sinuses. Paranasal sinuses are air pockets within the bones of your face. They are located beneath your eyes, in the middle of your forehead, and above your eyes. In healthy paranasal sinuses, mucus is able to drain out, and air is able to circulate through them by way of your nose. However, when your paranasal sinuses are inflamed, mucus and air can become trapped. This can allow bacteria and other germs to grow and cause infection. Sinusitis can develop quickly and last only a short time (acute) or continue over a long period (chronic). Sinusitis that lasts for more than 12 weeks is considered chronic. CAUSES Causes of sinusitis include:  Allergies.  Structural abnormalities, such as displacement of the cartilage that separates your nostrils (deviated septum), which can decrease the air flow through your nose and sinuses and affect sinus drainage.  Functional abnormalities, such as when the small hairs (cilia) that line your sinuses and help remove mucus do not work properly or are not present. SIGNS AND SYMPTOMS Symptoms of acute and chronic sinusitis are the same. The primary symptoms are pain and pressure around the affected sinuses. Other symptoms include:  Upper toothache.  Earache.  Headache.  Bad breath.  Decreased sense of smell and taste.  A cough, which worsens when you are lying flat.  Fatigue.  Fever.  Thick drainage from your nose, which often is green and may contain pus (purulent).  Swelling and warmth over the affected sinuses. DIAGNOSIS Your health care provider will perform a physical exam. During your exam, your health care provider may perform any of the following to help determine if you have acute sinusitis or chronic sinusitis:  Look in your nose for signs of  abnormal growths in your nostrils (nasal polyps).  Tap over the affected sinus to check for signs of infection.  View the inside of your sinuses using an imaging device that has a light attached (endoscope). If your health care provider suspects that you have chronic sinusitis, one or more of the following tests may be recommended:  Allergy tests.  Nasal culture. A sample of mucus is taken from your nose, sent to a lab, and screened for bacteria.  Nasal cytology. A sample of mucus is taken from your nose and examined by your health care provider to determine if your sinusitis is related to an allergy. TREATMENT Most cases of acute sinusitis are related to a viral infection and will resolve on their own within 10 days. Sometimes, medicines are prescribed to help relieve symptoms of both acute and chronic sinusitis. These may include pain medicines, decongestants, nasal steroid sprays, or saline sprays. However, for sinusitis related to a bacterial infection, your health care provider will prescribe antibiotic medicines. These are medicines that will help kill the bacteria causing the infection. Rarely, sinusitis is caused by a fungal infection. In these cases, your health care provider will prescribe antifungal medicine. For some cases of chronic sinusitis, surgery is needed. Generally, these are cases in which sinusitis recurs more than 3 times per year, despite other treatments. HOME CARE INSTRUCTIONS  Drink plenty of water. Water helps thin the mucus so your sinuses can drain more easily.  Use a humidifier.  Inhale steam 3-4 times a day (for example, sit in the bathroom with the shower running).  Apply a warm, moist washcloth to your face 3-4  times a day, or as directed by your health care provider.  Use saline nasal sprays to help moisten and clean your sinuses.  Take medicines only as directed by your health care provider.  If you were prescribed either an antibiotic or antifungal  medicine, finish it all even if you start to feel better. SEEK IMMEDIATE MEDICAL CARE IF:  You have increasing pain or severe headaches.  You have nausea, vomiting, or drowsiness.  You have swelling around your face.  You have vision problems.  You have a stiff neck.  You have difficulty breathing.   This information is not intended to replace advice given to you by your health care provider. Make sure you discuss any questions you have with your health care provider.   Document Released: 06/07/2005 Document Revised: 06/28/2014 Document Reviewed: 06/22/2011 Elsevier Interactive Patient Education [employer].

## 2015-05-24 NOTE — ED Notes (Signed)
Patient complains of sinus pain and pressure. Patient states her symptoms started on Saturday(1 week ago) and she has been trying to fight it all week with Muicnex DM and Neti Pot twice daily. Patient states that she has been fatigued, cough from drainage, post nasal drip.

## 2015-06-09 ENCOUNTER — Encounter: Payer: Self-pay | Admitting: Internal Medicine

## 2015-06-09 ENCOUNTER — Other Ambulatory Visit: Payer: Self-pay | Admitting: Internal Medicine

## 2015-06-09 ENCOUNTER — Ambulatory Visit (INDEPENDENT_AMBULATORY_CARE_PROVIDER_SITE_OTHER): Payer: BC Managed Care – PPO | Admitting: Internal Medicine

## 2015-06-09 VITALS — BP 120/80 | HR 60 | Temp 97.8°F | Ht 65.0 in | Wt 190.0 lb

## 2015-06-09 DIAGNOSIS — J0101 Acute recurrent maxillary sinusitis: Secondary | ICD-10-CM | POA: Diagnosis not present

## 2015-06-09 DIAGNOSIS — M778 Other enthesopathies, not elsewhere classified: Secondary | ICD-10-CM | POA: Insufficient documentation

## 2015-06-09 HISTORY — DX: Other enthesopathies, not elsewhere classified: M77.8

## 2015-06-09 MED ORDER — METHYLPREDNISOLONE 4 MG PO TBPK
ORAL_TABLET | ORAL | Status: DC
Start: 2015-06-09 — End: 2015-07-07

## 2015-06-09 MED ORDER — LEVOFLOXACIN 500 MG PO TABS: 500.0000 mg | ORAL_TABLET | Freq: Every day | ORAL | Status: DC

## 2015-06-09 NOTE — Progress Notes (Signed)
Date:  06/09/2015   Name:  Holly Villegas   DOB:  24-Sep-1960   MRN:  [id number]   Chief Complaint: Sinusitis Sinusitis This is a recurrent problem. The current episode started 1 to 4 weeks ago. The problem has been waxing and waning since onset. There has been no fever. Associated symptoms include congestion, coughing and sinus pressure. Pertinent negatives include no chills, diaphoresis, ear pain, shortness of breath or sore throat.   She received Augmentin for 10 days which she finished about 5 days ago. Her ears feel better but she still has lots of sinus drainage cough and sputum production. Wrist discomfort - for the past several weeks she's noted discomfort in the snuffbox region of her left wrist. She does play piano has been doing more with a Christmas cantata.  Review of Systems  Constitutional: Positive for fatigue. Negative for fever, chills and diaphoresis.  HENT: Positive for congestion, rhinorrhea and sinus pressure. Negative for ear pain and sore throat.   Respiratory: Positive for cough. Negative for chest tightness, shortness of breath and wheezing.   Cardiovascular: Negative for chest pain and palpitations.    Patient Active Problem List   Diagnosis Date Noted  . Plantar fasciitis, bilateral 11/29/2014  . Vitamin D deficiency 11/29/2014  . Dupuytren's contracture of foot 11/29/2014  . Dyslipidemia 11/28/2014  . Varicose veins 11/28/2014  . Environmental and seasonal allergies 11/28/2014  . Raynaud's syndrome without gangrene 11/28/2014  . Carpal tunnel syndrome 11/28/2014  . Benign colonic polyp 11/28/2014  . Diverticulosis of colon without diverticulitis 11/28/2014  . Essential hypertension 11/28/2014  . Impaired renal function 11/28/2014  . Paroxysmal digital cyanosis 11/28/2014  . Other allergic rhinitis 11/28/2014  . Colon, diverticulosis 11/28/2014  . Asymptomatic varicose veins 11/28/2014    Prior to Admission medications   Medication Sig  Start Date End Date Taking? Authorizing Provider  acetaminophen (TYLENOL) 500 MG tablet Take 1 tablet by mouth once as needed.   Yes Historical Provider, MD  atorvastatin (LIPITOR) 20 MG tablet TAKE (1) TABLET BY MOUTH EVERY DAY 05/02/15  Yes Glean Hess, MD  cetirizine (ZYRTEC) 10 MG tablet Take 1 tablet (10 mg total) by mouth daily. 03/14/15  Yes Glean Hess, MD  esomeprazole (NEXIUM) 40 MG capsule Take 1 tablet by mouth daily. 06/27/13  Yes Historical Provider, MD  etodolac (LODINE) 500 MG tablet Take 1 tablet by mouth 2 (two) times daily as needed. 04/21/15  Yes Historical Provider, MD  lisinopril-hydrochlorothiazide (PRINZIDE,ZESTORETIC) 20-25 MG tablet TAKE ONE (1) TABLET BY MOUTH ONCE DAILY 05/02/15  Yes Glean Hess, MD  Multiple Vitamin (MULTI-VITAMINS) TABS Take 1 tablet by mouth daily.   Yes Historical Provider, MD  budesonide (RHINOCORT AQUA) 32 MCG/ACT nasal spray Place 2 sprays into both nostrils daily. Patient not taking: Reported on 06/09/2015 05/24/15   Andria Meuse, NP  Norethindrone-Ethinyl Estradiol-Fe Biphas (LO LOESTRIN FE) 1 MG-10 MCG / 10 MCG tablet Take 1 tablet by mouth daily. Reported on 06/09/2015    Historical Provider, MD    Allergies  Allergen Reactions  . Codeine Sulfate Nausea And Vomiting  . Shellfish-Derived Products   . Sulfa Antibiotics Nausea And Vomiting    Past Surgical History  Procedure Laterality Date  . Throat surgery  2011    polyp removed from vocal cord  . Esophageal dilation  07/2009  . Cholecystectomy  2012  . Tubal ligation  1991    Social History  Substance Use Topics  . Smoking status:  Never Smoker   . Smokeless tobacco: None  . Alcohol Use: 0.0 oz/week    0 Standard drinks or equivalent per week     Comment: social     Medication list has been reviewed and updated.   Physical Exam  Constitutional: She is oriented to person, place, and time. She appears well-developed and well-nourished.  HENT:  Right Ear:  External ear and ear canal normal. Tympanic membrane is retracted. Tympanic membrane is not erythematous.  Left Ear: External ear and ear canal normal. Tympanic membrane is retracted. Tympanic membrane is not erythematous.  Nose: Right sinus exhibits maxillary sinus tenderness and frontal sinus tenderness. Left sinus exhibits maxillary sinus tenderness and frontal sinus tenderness.  Mouth/Throat: Uvula is midline and mucous membranes are normal. No oral lesions. No oropharyngeal exudate or posterior oropharyngeal erythema.  Neck: Normal range of motion. Neck supple. No thyromegaly present.  Cardiovascular: Normal rate, regular rhythm and normal heart sounds.   Pulmonary/Chest: Breath sounds normal. She has no wheezes. She has no rales.  Musculoskeletal:  Tender over left snuffbox  Lymphadenopathy:    She has no cervical adenopathy.  Neurological: She is alert and oriented to person, place, and time.  Nursing note and vitals reviewed.   BP 120/80 mmHg  Pulse 60  Temp(Src) 97.8 F (36.6 C)  Ht 5\' 5"  (1.651 m)  Wt 190 lb (86.183 kg)  BMI 31.62 kg/m2  SpO2 99%  Assessment and Plan: 1. Acute recurrent maxillary sinusitis Continue Mucinex and nasal flushes - levofloxacin (LEVAQUIN) 500 MG tablet; Take 1 tablet (500 mg total) by mouth daily.  Dispense: 10 tablet; Refill: 0 - methylPREDNISolone (MEDROL DOSEPAK) 4 MG TBPK tablet; Take 6 pills on day 1 the 5 pills day 2 then 4 pills day 3 then 3 pills day 4 then 2 pills day 5 then one pills day 6 then stop  Dispense: 21 tablet; Refill: 0  2. Left wrist tendonitis Should respond to prednisone taper Otherwise, topical rubs or oral nsaids as needed  Halina Maidens, MD [facility]  06/09/2015

## 2015-06-17 ENCOUNTER — Other Ambulatory Visit: Payer: Self-pay | Admitting: Internal Medicine

## 2015-07-01 ENCOUNTER — Ambulatory Visit
Admission: EM | Admit: 2015-07-01 | Discharge: 2015-07-01 | Disposition: A | Discharge status: Home / Self Care | Payer: BC Managed Care – PPO | Attending: Family Medicine | Admitting: Family Medicine

## 2015-07-01 DIAGNOSIS — J069 Acute upper respiratory infection, unspecified: Secondary | ICD-10-CM

## 2015-07-01 DIAGNOSIS — J029 Acute pharyngitis, unspecified: Secondary | ICD-10-CM

## 2015-07-01 LAB — RAPID STREP SCREEN (MED CTR MEBANE ONLY): Streptococcus, Group A Screen (Direct): NEGATIVE

## 2015-07-01 MED ORDER — AZITHROMYCIN 250 MG PO TABS: ORAL_TABLET | ORAL | Status: DC

## 2015-07-01 MED ORDER — FEXOFENADINE-PSEUDOEPHED ER 180-240 MG PO TB24: 1.0000 | ORAL_TABLET | Freq: Every day | ORAL | Status: DC

## 2015-07-01 MED ORDER — FLUTICASONE PROPIONATE 50 MCG/ACT NA SUSP: 2.0000 | Freq: Every day | NASAL | Status: DC

## 2015-07-01 NOTE — ED Provider Notes (Signed)
CSN: [id number]     Arrival date & time 07/01/15  [id number] History   First MD Initiated Contact with Patient 07/01/15 [phone]    Nurses notes were reviewed. Chief Complaint  Patient presents with  . Sore Throat   Patient reports sore throat that started on Sunday. So sore she can barely swallow or eat anything. She had a bad sinus infection in December requiring 2 rounds of antibiotics and prednisone. She states that after the second on anabiotic some prednisone she felt better over the Christmas holidays but then this recurred on Sunday. She is a Pharmacist, hospital. History of hypertension migraines but she does not smoke     (Consider location/radiation/quality/duration/timing/severity/associated sxs/prior Treatment) Patient is a 55 y.o. female presenting with pharyngitis. The history is provided by the patient. No language interpreter was used.  Sore Throat This is a new problem. The current episode started more than 2 days ago. The problem occurs constantly. The problem has been gradually worsening. Pertinent negatives include no chest pain, no abdominal pain, no headaches and no shortness of breath. Nothing aggravates the symptoms. Nothing relieves the symptoms. She has tried nothing for the symptoms.    History reviewed. No pertinent past medical history. Past Surgical History  Procedure Laterality Date  . Throat surgery  2011    polyp removed from vocal cord  . Esophageal dilation  07/2009  . Cholecystectomy  2012  . Tubal ligation  1991   Family History  Problem Relation Age of Onset  . Hypertension Mother   . Migraines Mother   . Peptic Ulcer Disease Father   . Breast cancer Maternal Aunt 42   Social History  Substance Use Topics  . Smoking status: Never Smoker   . Smokeless tobacco: None  . Alcohol Use: 0.0 oz/week    0 Standard drinks or equivalent per week     Comment: social   OB History    No data available     Review of Systems  Constitutional: Negative for fever.  HENT:  Positive for ear pain, sinus pressure, trouble swallowing and voice change.   Respiratory: Negative for shortness of breath.   Cardiovascular: Negative for chest pain.  Gastrointestinal: Negative for abdominal pain.  Neurological: Negative for headaches.  All other systems reviewed and are negative.   Allergies  Codeine sulfate; Shellfish-derived products; and Sulfa antibiotics  Home Medications   Prior to Admission medications   Medication Sig Start Date End Date Taking? Authorizing Provider  acetaminophen (TYLENOL) 500 MG tablet Take 1 tablet by mouth once as needed.   Yes Historical Provider, MD  atorvastatin (LIPITOR) 20 MG tablet TAKE (1) TABLET BY MOUTH EVERY DAY 05/02/15  Yes Glean Hess, MD  budesonide (RHINOCORT AQUA) 32 MCG/ACT nasal spray Place 2 sprays into both nostrils daily. 05/24/15  Yes Andria Meuse, NP  cetirizine (ZYRTEC) 10 MG tablet TAKE (1) TABLET BY MOUTH EVERY DAY 06/17/15  Yes Glean Hess, MD  esomeprazole (NEXIUM) 40 MG capsule Take 1 tablet by mouth daily. 06/27/13  Yes Historical Provider, MD  etodolac (LODINE) 500 MG tablet Take 1 tablet by mouth 2 (two) times daily as needed. 04/21/15  Yes Historical Provider, MD  lisinopril-hydrochlorothiazide (PRINZIDE,ZESTORETIC) 20-25 MG tablet TAKE (1) TABLET BY MOUTH EVERY DAY 06/17/15  Yes Glean Hess, MD  Multiple Vitamin (MULTI-VITAMINS) TABS Take 1 tablet by mouth daily.   Yes Historical Provider, MD  norgestrel-ethinyl estradiol (LO/OVRAL,CRYSELLE) 0.3-30 MG-MCG tablet Take 1 tablet by mouth daily.   Yes Historical  Provider, MD  azithromycin (ZITHROMAX Z-PAK) 250 MG tablet Take 2 tablets first day and then 1 po a day for 4 days 07/01/15   Frederich Cha, MD  fexofenadine-pseudoephedrine (ALLEGRA-D ALLERGY & CONGESTION) 180-240 MG 24 hr tablet Take 1 tablet by mouth daily. 07/01/15   Frederich Cha, MD  fluticasone (FLONASE) 50 MCG/ACT nasal spray Place 2 sprays into both nostrils daily. 07/01/15   Frederich Cha,  MD  levofloxacin (LEVAQUIN) 500 MG tablet Take 1 tablet (500 mg total) by mouth daily. 06/09/15   Glean Hess, MD  methylPREDNISolone (MEDROL DOSEPAK) 4 MG TBPK tablet Take 6 pills on day 1 the 5 pills day 2 then 4 pills day 3 then 3 pills day 4 then 2 pills day 5 then one pills day 6 then stop 06/09/15   Glean Hess, MD  Norethindrone-Ethinyl Estradiol-Fe Biphas (LO LOESTRIN FE) 1 MG-10 MCG / 10 MCG tablet Take 1 tablet by mouth daily. Reported on 06/09/2015    Historical Provider, MD   Meds Ordered and Administered this Visit  Medications - No data to display  BP 94/46 mmHg  Pulse 78  Temp(Src) 98.1 F (36.7 C) (Tympanic)  Resp 16  Ht 5\' 5"  (1.651 m)  Wt 190 lb (86.183 kg)  BMI 31.62 kg/m2  SpO2 99% No data found.   Physical Exam  Constitutional: She is oriented to person, place, and time. She appears well-developed and well-nourished.  HENT:  Head: Normocephalic and atraumatic.  Nose: Mucosal edema and rhinorrhea present. Right sinus exhibits maxillary sinus tenderness. Left sinus exhibits maxillary sinus tenderness.  Mouth/Throat: Uvula is midline. Dental abscesses present. Posterior oropharyngeal edema and posterior oropharyngeal erythema present.    Exudates present left tonsil  Eyes: Conjunctivae are normal. Pupils are equal, round, and reactive to light.  Neck: Normal range of motion.  Cardiovascular: Normal rate.   Musculoskeletal: Normal range of motion.  Lymphadenopathy:    She has cervical adenopathy.  Neurological: She is alert and oriented to person, place, and time.  Skin: Skin is warm and dry.  Psychiatric: She has a normal mood and affect.  Vitals reviewed.   ED Course  Procedures (including critical care time)  Labs Review Labs Reviewed  RAPID STREP SCREEN (NOT AT [facility])  CULTURE, GROUP A STREP [facility])    Imaging Review No results found.   Visual Acuity Review  Right Eye Distance:   Left Eye Distance:   Bilateral Distance:    Right  Eye Near:   Left Eye Near:    Bilateral Near:     Results for orders placed or performed during the hospital encounter of 07/01/15  Rapid strep screen  Result Value Ref Range   Streptococcus, Group A Screen (Direct) NEGATIVE NEGATIVE      MDM   1. Acute pharyngitis, unspecified etiology   2. URI, acute    Patient with pharyngitis lesions on left tonsillar bed. We'll place on Zithromax for follow-up. Allegra-D and Flonase nasal spray.    Frederich Cha, MD 07/01/15 [id number]

## 2015-07-01 NOTE — Discharge Instructions (Signed)
Strep Throat Strep throat is an infection of the throat. It is caused by germs. Strep throat spreads from person to person because of coughing, sneezing, or close contact. HOME CARE Medicines  Take over-the-counter and prescription medicines only as told by your doctor.  Take your antibiotic medicine as told by your doctor. Do not stop taking the medicine even if you feel better.  Have family members who also have a sore throat or fever go to a doctor. Eating and Drinking  Do not share food, drinking cups, or personal items.  Try eating soft foods until your sore throat feels better.  Drink enough fluid to keep your pee (urine) clear or pale yellow. General Instructions  Rinse your mouth (gargle) with a salt-water mixture 3-4 times per day or as needed. To make a salt-water mixture, stir -1 tsp of salt into 1 cup of warm water.  Make sure that all people in your house wash their hands well.  Rest.  Stay home from school or work until you have been taking antibiotics for 24 hours.  Keep all follow-up visits as told by your doctor. This is important. GET HELP IF:  Your neck keeps getting bigger.  You get a rash, cough, or earache.  You cough up thick liquid that is green, yellow-brown, or bloody.  You have pain that does not get better with medicine.  Your problems get worse instead of getting better.  You have a fever. GET HELP RIGHT AWAY IF:  You throw up (vomit).  You get a very bad headache.  You neck hurts or it feels stiff.  You have chest pain or you are short of breath.  You have drooling, very bad throat pain, or changes in your voice.  Your neck is swollen or the skin gets red and tender.  Your mouth is dry or you are peeing less than normal.  You keep feeling more tired or it is hard to wake up.  Your joints are red or they hurt.   This information is not intended to replace advice given to you by your health care provider. Make sure you  discuss any questions you have with your health care provider.   Document Released: 11/24/2007 Document Revised: 02/26/2015 Document Reviewed: 09/30/2014 Elsevier Interactive Patient Education 2016 [employer]  Pharyngitis Pharyngitis is a sore throat (pharynx). There is redness, pain, and swelling of your throat. HOME CARE   Drink enough fluids to keep your pee (urine) clear or pale yellow.  Only take medicine as told by your doctor.  You may get sick again if you do not take medicine as told. Finish your medicines, even if you start to feel better.  Do not take aspirin.  Rest.  Rinse your mouth (gargle) with salt water ( tsp of salt per 1 qt of water) every 1-2 hours. This will help the pain.  If you are not at risk for choking, you can suck on hard candy or sore throat lozenges. GET HELP IF:  You have large, tender lumps on your neck.  You have a rash.  You cough up green, yellow-brown, or bloody spit. GET HELP RIGHT AWAY IF:   You have a stiff neck.  You drool or cannot swallow liquids.  You throw up (vomit) or are not able to keep medicine or liquids down.  You have very bad pain that does not go away with medicine.  You have problems breathing (not from a stuffy nose). MAKE SURE YOU:   Understand  these instructions.  Will watch your condition.  Will get help right away if you are not doing well or get worse.   This information is not intended to replace advice given to you by your health care provider. Make sure you discuss any questions you have with your health care provider.   Document Released: 11/24/2007 Document Revised: 03/28/2013 Document Reviewed: 02/12/2013 Elsevier Interactive Patient Education 2016 [employer]  Sore Throat A sore throat is a painful, burning, sore, or scratchy feeling of the throat. There may be pain or tenderness when swallowing or talking. You may have other symptoms with a sore throat. These include coughing, sneezing,  fever, or a swollen neck. A sore throat is often the first sign of another sickness. These sicknesses may include a cold, flu, strep throat, or an infection called mono. Most sore throats go away without medical treatment.  HOME CARE   Only take medicine as told by your doctor.  Drink enough fluids to keep your pee (urine) clear or pale yellow.  Rest as needed.  Try using throat sprays, lozenges, or suck on hard candy (if older than 4 years or as told).  Sip warm liquids, such as broth, herbal tea, or warm water with honey. Try sucking on frozen ice pops or drinking cold liquids.  Rinse the mouth (gargle) with salt water. Mix 1 teaspoon salt with 8 ounces of water.  Do not smoke. Avoid being around others when they are smoking.  Put a humidifier in your bedroom at night to moisten the air. You can also turn on a hot shower and sit in the bathroom for 5-10 minutes. Be sure the bathroom door is closed. GET HELP RIGHT AWAY IF:   You have trouble breathing.  You cannot swallow fluids, soft foods, or your spit (saliva).  You have more puffiness (swelling) in the throat.  Your sore throat does not get better in 7 days.  You feel sick to your stomach (nauseous) and throw up (vomit).  You have a fever or lasting symptoms for more than 2-3 days.  You have a fever and your symptoms suddenly get worse. MAKE SURE YOU:   Understand these instructions.  Will watch your condition.  Will get help right away if you are not doing well or get worse.   This information is not intended to replace advice given to you by your health care provider. Make sure you discuss any questions you have with your health care provider.   Document Released: 03/16/2008 Document Revised: 03/01/2012 Document Reviewed: 02/13/2012 Elsevier Interactive Patient Education [employer].

## 2015-07-01 NOTE — ED Notes (Signed)
Started Sunday night with sore throat. Bilateral ear pain, left worse than right. Not drinking well.

## 2015-07-02 ENCOUNTER — Other Ambulatory Visit: Payer: Self-pay | Admitting: Internal Medicine

## 2015-07-03 LAB — CULTURE, GROUP A STREP (THRC)

## 2015-07-07 ENCOUNTER — Ambulatory Visit (INDEPENDENT_AMBULATORY_CARE_PROVIDER_SITE_OTHER): Payer: BC Managed Care – PPO | Admitting: Internal Medicine

## 2015-07-07 ENCOUNTER — Encounter: Payer: Self-pay | Admitting: Internal Medicine

## 2015-07-07 VITALS — BP 100/60 | HR 62 | Ht 65.0 in | Wt 188.6 lb

## 2015-07-07 DIAGNOSIS — R002 Palpitations: Secondary | ICD-10-CM

## 2015-07-07 DIAGNOSIS — I10 Essential (primary) hypertension: Secondary | ICD-10-CM

## 2015-07-07 NOTE — Progress Notes (Signed)
Date:  07/07/2015   Name:  Holly Villegas   DOB:  05/16/1961   MRN:  [id number]   Chief Complaint: Follow-up and Hypertension Hypertension This is a chronic problem. The current episode started more than 1 year ago. The problem has been waxing and waning since onset. Associated symptoms include palpitations. Pertinent negatives include no chest pain, headaches or shortness of breath.   she has been taking her blood pressure at home for some time normally runs around [id number] systolic. A week ago she was in urgent care with severe bacterial throat infection and her blood pressure was 90 systolic. His finished her antibiotics and feels recovered from that infection but blood pressures are still running in the 90-100 range. In addition her monitor has detected an irregular heartbeat on several occasions. She is not aware of an irregular heartbeat but has felt some irregularity when taking her pulse. The rate has not been over 100. In addition to a Z-Pak, she was also given Allegra-D which she has been taking along with zyrtec.    Review of Systems  Constitutional: Positive for fatigue. Negative for fever, chills and diaphoresis.  HENT: Positive for congestion. Negative for hearing loss, sore throat and tinnitus.   Eyes: Negative for visual disturbance.  Respiratory: Positive for chest tightness. Negative for shortness of breath and wheezing.   Cardiovascular: Positive for palpitations. Negative for chest pain and leg swelling.  Gastrointestinal: Negative for nausea, abdominal pain and diarrhea.  Neurological: Negative for dizziness, syncope, light-headedness and headaches.    Patient Active Problem List   Diagnosis Date Noted  . Heart palpitations 07/07/2015  . Left wrist tendonitis 06/09/2015  . Plantar fasciitis, bilateral 11/29/2014  . Vitamin D deficiency 11/29/2014  . Dupuytren's contracture of foot 11/29/2014  . Dyslipidemia 11/28/2014  . Varicose veins 11/28/2014  .  Environmental and seasonal allergies 11/28/2014  . Raynaud's syndrome without gangrene 11/28/2014  . Carpal tunnel syndrome 11/28/2014  . Benign colonic polyp 11/28/2014  . Diverticulosis of colon without diverticulitis 11/28/2014  . Essential hypertension 11/28/2014  . Impaired renal function 11/28/2014  . Paroxysmal digital cyanosis 11/28/2014  . Other allergic rhinitis 11/28/2014  . Colon, diverticulosis 11/28/2014  . Asymptomatic varicose veins 11/28/2014    Prior to Admission medications   Medication Sig Start Date End Date Taking? Authorizing Provider  acetaminophen (TYLENOL) 500 MG tablet Take 1 tablet by mouth once as needed.   Yes Historical Provider, MD  atorvastatin (LIPITOR) 20 MG tablet TAKE (1) TABLET BY MOUTH EVERY DAY 07/02/15  Yes Glean Hess, MD  cetirizine (ZYRTEC) 10 MG tablet TAKE (1) TABLET BY MOUTH EVERY DAY 06/17/15  Yes Glean Hess, MD  esomeprazole (NEXIUM) 40 MG capsule Take 1 tablet by mouth daily. 06/27/13  Yes Historical Provider, MD  etodolac (LODINE) 500 MG tablet Take 1 tablet by mouth 2 (two) times daily as needed. 04/21/15  Yes Historical Provider, MD  fexofenadine-pseudoephedrine (ALLEGRA-D ALLERGY & CONGESTION) 180-240 MG 24 hr tablet Take 1 tablet by mouth daily. 07/01/15  Yes Frederich Cha, MD  fluticasone (FLONASE) 50 MCG/ACT nasal spray Place 2 sprays into both nostrils daily. 07/01/15  Yes Frederich Cha, MD  lisinopril-hydrochlorothiazide (PRINZIDE,ZESTORETIC) 20-25 MG tablet TAKE (1) TABLET BY MOUTH EVERY DAY 06/17/15  Yes Glean Hess, MD  Multiple Vitamin (MULTI-VITAMINS) TABS Take 1 tablet by mouth daily.   Yes Historical Provider, MD  norgestrel-ethinyl estradiol (LO/OVRAL,CRYSELLE) 0.3-30 MG-MCG tablet Take 1 tablet by mouth daily.   Yes Historical Provider, MD  Allergies  Allergen Reactions  . Codeine Sulfate Nausea And Vomiting  . Shellfish-Derived Products   . Sulfa Antibiotics Nausea And Vomiting    Past Surgical History    Procedure Laterality Date  . Throat surgery  2011    polyp removed from vocal cord  . Esophageal dilation  07/2009  . Cholecystectomy  2012  . Tubal ligation  1991    Social History  Substance Use Topics  . Smoking status: Never Smoker   . Smokeless tobacco: None  . Alcohol Use: 0.0 oz/week    0 Standard drinks or equivalent per week     Comment: social     Medication list has been reviewed and updated.   Physical Exam  Constitutional: She is oriented to person, place, and time. She appears well-developed and well-nourished.  HENT:  Right Ear: Tympanic membrane and ear canal normal.  Left Ear: Tympanic membrane and ear canal normal.  Nose: Right sinus exhibits no maxillary sinus tenderness. Left sinus exhibits no maxillary sinus tenderness.  Mouth/Throat: Uvula is midline and oropharynx is clear and moist.  Neck: Normal range of motion. Neck supple. No thyromegaly present.  Cardiovascular: Normal rate, regular rhythm, normal heart sounds and normal pulses.   Occasional extrasystoles are present.  Pulmonary/Chest: Effort normal and breath sounds normal.  Abdominal: Soft. Normal appearance.  Musculoskeletal: She exhibits no edema or tenderness.  Neurological: She is alert and oriented to person, place, and time.  Skin: Skin is warm and dry.  Psychiatric: She has a normal mood and affect.    BP 100/60 mmHg  Pulse 62  Ht 5\' 5"  (1.651 m)  Wt 188 lb 9.6 oz (85.548 kg)  BMI 31.38 kg/m2  Assessment and Plan: 1. Essential hypertension Blood pressure running low so we will discontinue lisinopril Monitor blood pressures at home and call if systolic greater than 0000000  2. Heart palpitations Due to PVCs seen on EKG Discontinue Allegra-D Patient is reassured but can follow-up if symptoms worsen - EKG 12-Lead   Halina Maidens, MD [facility]  07/07/2015

## 2015-07-07 NOTE — Patient Instructions (Signed)
Premature Ventricular Contraction A premature ventricular contraction is an irregularity in the normal heart rhythm. These contractions are extra heartbeats that occur too early in the normal sequence. In most cases, these contractions are harmless and do not require treatment. CAUSES Premature ventricular contractions may occur without a known cause. In healthy people, the extra contractions may be caused by:  Smoking.  Drinking alcohol.  Caffeine.  Certain medicines.  Some illegal drugs.  Stress. Sometimes, changes in chemicals in the blood (electrolytes) can also cause premature ventricular contractions. They can also occur in people with heart diseases that cause a decrease in blood flow to the heart. SIGNS AND SYMPTOMS Premature ventricular contractions often do not cause any symptoms. In some cases, you may have a feeling of your heart beating fast or skipping a beat (palpitations). DIAGNOSIS Your health care provider will take your medical history and do a physical exam. During the exam, the health care provider will check for irregular heartbeats. Various tests may be done to help diagnose premature ventricular contractions. These tests may include:  An ECG (electrocardiogram) to monitor the electrical activity of your heart.  Holter monitor testing. A Holter monitor is a portable device that can monitor the electrical activity of your heart over longer periods of time.  Stress tests to see how exercise affects your heart rhythm.  Echocardiogram. This test uses sound waves (ultrasound) to produce an image of your heart.  Electrophysiology study. This is used to evaluate the electrical conduction system of your heart. TREATMENT Usually, no treatment is needed. You may be advised to avoid things that can trigger the premature contractions, such as caffeine or alcohol. Medicines are sometimes given if symptoms are severe or if the extra heartbeats are very frequent. Treatment may  also be needed for an underlying cause of the contractions if one is found. HOME CARE INSTRUCTIONS  Take medicines only as directed by your health care provider.  Make any lifestyle changes recommended by your health care provider. These may include:  Quitting smoking.  Avoiding or limiting caffeine or alcohol.  Exercising. Talk to your health care provider about what type of exercise is safe for you.  Trying to reduce stress.  Keep all follow-up visits with your health care provider. This is important. SEEK IMMEDIATE MEDICAL CARE IF:  You feel palpitations that are frequent or continual.  You have chest pain.  You have shortness of breath.  You have sweating for no reason.  You have nausea and vomiting.  You become light-headed or faint.   This information is not intended to replace advice given to you by your health care provider. Make sure you discuss any questions you have with your health care provider.   Document Released: 01/23/2004 Document Revised: 06/28/2014 Document Reviewed: 11/08/2013 Elsevier Interactive Patient Education 2016 [employer]  

## 2015-07-29 ENCOUNTER — Ambulatory Visit (INDEPENDENT_AMBULATORY_CARE_PROVIDER_SITE_OTHER): Payer: BC Managed Care – PPO | Admitting: Internal Medicine

## 2015-07-29 ENCOUNTER — Encounter: Payer: Self-pay | Admitting: Internal Medicine

## 2015-07-29 VITALS — BP 138/84 | HR 58 | Ht 65.0 in | Wt 191.4 lb

## 2015-07-29 DIAGNOSIS — R000 Tachycardia, unspecified: Secondary | ICD-10-CM | POA: Diagnosis not present

## 2015-07-29 DIAGNOSIS — I8392 Asymptomatic varicose veins of left lower extremity: Secondary | ICD-10-CM | POA: Diagnosis not present

## 2015-07-29 NOTE — Progress Notes (Signed)
Date:  07/29/2015   Name:  Holly Villegas   DOB:  15-Sep-1960   MRN:  [id number]   Chief Complaint: Palpitations Woke this am with the feeling of heart beating "funny" and having trouble catching her breath.  She went on to work but then returned home after several hours.  She a brief spell of feeling like her heart was racing. Heart rate was 119 on her FitBit. BP at home was 130/80.  After resting she felt better and now has no symptoms of fast heart beat or shortness of breath. She is on no blood pressure medication. She takes no herbal supplements, stimulants, sinus medication or cold medication. She does not consume excessive amounts of caffeine. She's been getting sufficient sleep, exercising regularly and avoiding high amounts of sodium. She is also noticed some swelling in her left ankle from her varicose veins. Skin is slightly warm and tender. She's been wearing compression stockings to the knee with some benefit.   Review of Systems  Constitutional: Negative for fever, chills, appetite change and fatigue.  Respiratory: Positive for shortness of breath. Negative for chest tightness and wheezing.   Cardiovascular: Positive for palpitations and leg swelling. Negative for chest pain.  Neurological: Negative for dizziness, tremors, syncope, numbness and headaches.    Patient Active Problem List   Diagnosis Date Noted  . Heart palpitations 07/07/2015  . Left wrist tendonitis 06/09/2015  . Plantar fasciitis, bilateral 11/29/2014  . Vitamin D deficiency 11/29/2014  . Dupuytren's contracture of foot 11/29/2014  . Dyslipidemia 11/28/2014  . Environmental and seasonal allergies 11/28/2014  . Raynaud's syndrome without gangrene 11/28/2014  . Carpal tunnel syndrome 11/28/2014  . Benign colonic polyp 11/28/2014  . Diverticulosis of colon without diverticulitis 11/28/2014  . Essential hypertension 11/28/2014  . Impaired renal function 11/28/2014  . Asymptomatic varicose veins  11/28/2014    Prior to Admission medications   Medication Sig Start Date End Date Taking? Authorizing Provider  acetaminophen (TYLENOL) 500 MG tablet Take 1 tablet by mouth once as needed.   Yes Historical Provider, MD  atorvastatin (LIPITOR) 20 MG tablet TAKE (1) TABLET BY MOUTH EVERY DAY 07/02/15  Yes Glean Hess, MD  cetirizine (ZYRTEC) 10 MG tablet TAKE (1) TABLET BY MOUTH EVERY DAY 06/17/15  Yes Glean Hess, MD  esomeprazole (NEXIUM) 40 MG capsule Take 1 tablet by mouth daily. 06/27/13  Yes Historical Provider, MD  etodolac (LODINE) 500 MG tablet Take 1 tablet by mouth 2 (two) times daily as needed. 04/21/15  Yes Historical Provider, MD  fluticasone (FLONASE) 50 MCG/ACT nasal spray Place 2 sprays into both nostrils daily. 07/01/15  Yes Frederich Cha, MD  Multiple Vitamin (MULTI-VITAMINS) TABS Take 1 tablet by mouth daily.   Yes Historical Provider, MD  norgestrel-ethinyl estradiol (LO/OVRAL,CRYSELLE) 0.3-30 MG-MCG tablet Take 1 tablet by mouth daily.   Yes Historical Provider, MD    Allergies  Allergen Reactions  . Codeine Sulfate Nausea And Vomiting  . Shellfish-Derived Products   . Sulfa Antibiotics Nausea And Vomiting    Past Surgical History  Procedure Laterality Date  . Throat surgery  2011    polyp removed from vocal cord  . Esophageal dilation  07/2009  . Cholecystectomy  2012  . Tubal ligation  1991    Social History  Substance Use Topics  . Smoking status: Never Smoker   . Smokeless tobacco: None  . Alcohol Use: 0.0 oz/week    0 Standard drinks or equivalent per week  Comment: social     Medication list has been reviewed and updated.   Physical Exam  Constitutional: She is oriented to person, place, and time. She appears well-developed. No distress.  HENT:  Head: Normocephalic and atraumatic.  Cardiovascular: Normal rate, regular rhythm and normal heart sounds.   No extrasystoles are present.  Pulmonary/Chest: Effort normal and breath sounds  normal. No accessory muscle usage. No respiratory distress.  Musculoskeletal: Normal range of motion. She exhibits edema (1+ at left ankle with some mild skin warmth).  Neurological: She is alert and oriented to person, place, and time.  Skin: Skin is warm and dry. No rash noted.  Psychiatric: Her behavior is normal. Thought content normal. Her mood appears anxious.    BP 138/84 mmHg  Pulse 58  Ht 5\' 5"  (1.651 m)  Wt 191 lb 6.4 oz (86.818 kg)  BMI 31.85 kg/m2  Assessment and Plan: 1. Tachycardia Up to 119 on her FitBit Normal now - will refer - Ambulatory referral to Cardiology  2. Asymptomatic varicose veins, left Wear compression stockings and elevate   Halina Maidens, MD [facility]  07/29/2015

## 2015-08-04 ENCOUNTER — Ambulatory Visit (INDEPENDENT_AMBULATORY_CARE_PROVIDER_SITE_OTHER): Payer: BC Managed Care – PPO

## 2015-08-04 ENCOUNTER — Ambulatory Visit
Admission: EM | Admit: 2015-08-04 | Discharge: 2015-08-04 | Disposition: A | Discharge status: Home / Self Care | Payer: BC Managed Care – PPO | Attending: Family Medicine | Admitting: Family Medicine

## 2015-08-04 ENCOUNTER — Encounter: Payer: Self-pay | Admitting: *Deleted

## 2015-08-04 DIAGNOSIS — R109 Unspecified abdominal pain: Secondary | ICD-10-CM

## 2015-08-04 HISTORY — DX: Pure hypercholesterolemia, unspecified: E78.00

## 2015-08-04 HISTORY — DX: Gastro-esophageal reflux disease without esophagitis: K21.9

## 2015-08-04 HISTORY — DX: Essential (primary) hypertension: I10

## 2015-08-04 HISTORY — DX: Diverticulosis of intestine, part unspecified, without perforation or abscess without bleeding: K57.90

## 2015-08-04 LAB — CBC WITH DIFFERENTIAL/PLATELET
Basophils Absolute: 0 10*3/uL (ref 0–0.1)
Basophils Relative: 0 %
EOS ABS: 0.2 10*3/uL (ref 0–0.7)
EOS PCT: 2 %
HCT: 34 % — ABNORMAL LOW (ref 35.0–47.0)
Hemoglobin: 11.6 g/dL — ABNORMAL LOW (ref 12.0–16.0)
LYMPHS ABS: 3.2 10*3/uL (ref 1.0–3.6)
Lymphocytes Relative: 38 %
MCH: 30 pg (ref 26.0–34.0)
MCHC: 34.3 g/dL (ref 32.0–36.0)
MCV: 87.6 fL (ref 80.0–100.0)
MONOS PCT: 8 %
Monocytes Absolute: 0.7 10*3/uL (ref 0.2–0.9)
Neutro Abs: 4.4 10*3/uL (ref 1.4–6.5)
Neutrophils Relative %: 52 %
PLATELETS: 252 10*3/uL (ref 150–440)
RBC: 3.88 MIL/uL (ref 3.80–5.20)
RDW: 13.4 % (ref 11.5–14.5)
WBC: 8.5 10*3/uL (ref 3.6–11.0)

## 2015-08-04 LAB — URINALYSIS COMPLETE WITH MICROSCOPIC (ARMC ONLY)
Glucose, UA: NEGATIVE mg/dL
Hgb urine dipstick: NEGATIVE
Ketones, ur: NEGATIVE mg/dL
Leukocytes, UA: NEGATIVE
Nitrite: NEGATIVE
Protein, ur: NEGATIVE mg/dL
RBC / HPF: NONE SEEN RBC/hpf (ref 0–5)
Specific Gravity, Urine: 1.01 (ref 1.005–1.030)
pH: 6.5 (ref 5.0–8.0)

## 2015-08-04 LAB — BASIC METABOLIC PANEL
Anion gap: 8 (ref 5–15)
BUN: 11 mg/dL (ref 6–20)
CO2: 24 mmol/L (ref 22–32)
Calcium: 8.9 mg/dL (ref 8.9–10.3)
Chloride: 105 mmol/L (ref 101–111)
Creatinine, Ser: 0.75 mg/dL (ref 0.44–1.00)
GFR calc Af Amer: >60 mL/min (ref >60)
GFR calc non Af Amer: >60 mL/min (ref >60)
Glucose, Bld: 95 mg/dL (ref 65–99)
Potassium: 3.7 mmol/L (ref 3.5–5.1)
Sodium: 137 mmol/L (ref 135–145)

## 2015-08-04 LAB — LIPASE, BLOOD: Lipase: 25 U/L (ref 11–51)

## 2015-08-04 MED ORDER — CIPROFLOXACIN HCL 250 MG PO TABS: 250.0000 mg | ORAL_TABLET | Freq: Two times a day (BID) | ORAL | Status: AC

## 2015-08-04 NOTE — ED Provider Notes (Signed)
[facility]  ____________________________________________  Time seen: Approximately 8:48 PM  I have reviewed the triage vital signs and the nursing notes.   HISTORY  Chief Complaint Abdominal Pain  HPI Holly Villegas is a 55 y.o. female presents with a complaint of intermittent left flank pain and left lower quadrant abdominal pain 2 days. Patient reports that pain is not constant. Patient states that pain is intermittent and comes without known trigger. Patient states that when pain is present is 7 out of 10 and sharp stabbing and catching pain. Patient denies movement triggers or direct palpation triggers. Denies history of similar. Patient reports that she does have a history of diverticulitis about 20 years ago but states that this feels different. Denies bowel changes. Denies urinary frequency, urinary urgency, burning with urination, pelvic pain, vaginal complaints. Denies blood in urine or stool. Reports continues to have normal bowel movements daily as well as continues to pass gas normally. States she is currently not in any pain.   Denies fevers. Denies pain radiation. Denies trigger for pain. Denies chest pain or shortness of breath. Patient reports that she has had history of chest pain and shortness of breath over the last several months that she is currently following cardiology for. Denies current chest pain or shortness of breath, denies recent chest pain or shortness of breath or recent chest pain or shortness of breath changes.  PCP Army Melia  Last menstrual: 2 weeks ago. Denies chance of pregnancy. States not sexually active.   Past Medical History  Diagnosis Date  . Hypertension   . Diverticulosis   . Hypercholesteremia   . GERD (gastroesophageal reflux disease)     Patient Active Problem List   Diagnosis Date Noted  . Heart palpitations 07/07/2015  . Left wrist tendonitis 06/09/2015  . Plantar fasciitis, bilateral 11/29/2014  . Vitamin D  deficiency 11/29/2014  . Dupuytren's contracture of foot 11/29/2014  . Dyslipidemia 11/28/2014  . Environmental and seasonal allergies 11/28/2014  . Raynaud's syndrome without gangrene 11/28/2014  . Carpal tunnel syndrome 11/28/2014  . Benign colonic polyp 11/28/2014  . Diverticulosis of colon without diverticulitis 11/28/2014  . Essential hypertension 11/28/2014  . Impaired renal function 11/28/2014  . Asymptomatic varicose veins 11/28/2014    Past Surgical History  Procedure Laterality Date  . Throat surgery  2011    polyp removed from vocal cord  . Esophageal dilation  07/2009  . Cholecystectomy  2012  . Tubal ligation  1991    Current Outpatient Rx  Name  Route  Sig  Dispense  Refill  . acetaminophen (TYLENOL) 500 MG tablet   Oral   Take 1 tablet by mouth once as needed.         Marland Kitchen atorvastatin (LIPITOR) 20 MG tablet      TAKE (1) TABLET BY MOUTH EVERY DAY   30 tablet   5   . cetirizine (ZYRTEC) 10 MG tablet      TAKE (1) TABLET BY MOUTH EVERY DAY   30 tablet   5   . esomeprazole (NEXIUM) 40 MG capsule   Oral   Take 1 tablet by mouth daily.         Marland Kitchen etodolac (LODINE) 500 MG tablet   Oral   Take 1 tablet by mouth 2 (two) times daily as needed.         . fluticasone (FLONASE) 50 MCG/ACT nasal spray   Each Nare   Place 2 sprays into both nostrils daily.   16 g  0   . Multiple Vitamin (MULTI-VITAMINS) TABS   Oral   Take 1 tablet by mouth daily.         . norgestrel-ethinyl estradiol (LO/OVRAL,CRYSELLE) 0.3-30 MG-MCG tablet   Oral   Take 1 tablet by mouth daily.           Allergies Codeine sulfate; Shellfish-derived products; and Sulfa antibiotics  Family History  Problem Relation Age of Onset  . Hypertension Mother   . Migraines Mother   . Peptic Ulcer Disease Father   . Breast cancer Maternal Aunt 57    Social History Social History  Substance Use Topics  . Smoking status: Never Smoker   . Smokeless tobacco: None  . Alcohol  Use: 0.0 oz/week    0 Standard drinks or equivalent per week     Comment: social    Review of Systems Constitutional: No fever/chills Eyes: No visual changes. ENT: No sore throat. Cardiovascular: Denies chest pain. Respiratory: Denies shortness of breath. Denies pain with deep breath.  Gastrointestinal: No abdominal pain.  No nausea, no vomiting.  No diarrhea.  No constipation.Left flank pain.  Genitourinary: Negative for dysuria. Musculoskeletal: Negative for back pain. Skin: Negative for rash. Neurological: Negative for headaches, focal weakness or numbness.  10-point ROS otherwise negative.  ____________________________________________   PHYSICAL EXAM:  VITAL SIGNS: ED Triage Vitals  Enc Vitals Group     BP 08/04/15 1822 169/82 mmHg     Pulse Rate 08/04/15 1822 62     Resp 08/04/15 1822 18     Temp 08/04/15 1822 98.1 F (36.7 C)     Temp Source 08/04/15 1822 Oral     SpO2 08/04/15 1822 100 %     Weight 08/04/15 1822 187 lb (84.823 kg)     Height 08/04/15 1822 5\' 5"  (1.651 m)     Head Cir --      Peak Flow --      Pain Score 08/04/15 1832 8     Pain Loc --      Pain Edu? --      Excl. in [place]   08/04/15 1832 08/04/15 2035 08/04/15 2053 08/04/15 2131  BP:  162/72 144/86   Pulse:  57 63   Temp:   98.6 F (37 C)   TempSrc:   Tympanic   Resp:   16   Height:      Weight:      SpO2:   100%   PainSc: 8   0-No pain 0-No pain     Constitutional: Alert and oriented. Well appearing and in no acute distress. Eyes: Conjunctivae are normal. PERRL. EOMI. Head: Atraumatic.No sinus tenderness to palpation.  Ears: no erythema, normal TMs bilaterally.   Nose: No congestion/rhinnorhea.  Mouth/Throat: Mucous membranes are moist.  Oropharynx non-erythematous. Neck: No stridor.  No cervical spine tenderness to palpation. Hematological/Lymphatic/Immunilogical: No cervical lymphadenopathy. Cardiovascular: Normal rate, regular rhythm. Grossly normal heart  sounds.  Good peripheral circulation. Respiratory: Normal respiratory effort.  No retractions. Lungs CTAB.No wheezes, rales or rhonchi. Good air movement.  Gastrointestinal: Soft and nontender. No distention. Normal Bowel sounds.  No abdominal bruits. No right CVA tenderness, minimal left CVA tenderness.  Musculoskeletal: No lower or upper extremity tenderness nor edema.  Bilateral pedal pulses equal and easily palpated.No calf tenderness bilaterally. No midline cervical, thoracic or lumbar tenderness to palpation. No flank pain with position changes or overhead stretching or twisting.  Neurologic:  Normal speech and language. No gross focal  neurologic deficits are appreciated. No gait instability. Skin:  Skin is warm, dry and intact. No rash noted. Psychiatric: Mood and affect are normal. Speech and behavior are normal.  ____________________________________________   LABS (all labs ordered are listed, but only abnormal results are displayed)  Labs Reviewed  URINALYSIS COMPLETEWITH MICROSCOPIC ([place]) - Abnormal; Notable for the following:    Bilirubin Urine 2+ (*)    Bacteria, UA FEW (*)    Squamous Epithelial / LPF 6-30 (*)    All other components within normal limits  CBC WITH DIFFERENTIAL/PLATELET - Abnormal; Notable for the following:    Hemoglobin 11.6 (*)    HCT 34.0 (*)    All other components within normal limits  URINE CULTURE  BASIC METABOLIC PANEL  LIPASE, BLOOD    RADIOLOGY  EXAM: ABDOMEN - 1 VIEW  COMPARISON: None.  FINDINGS: The bowel gas pattern is normal. No radio-opaque calculi or other significant radiographic abnormality are seen. Transitional anatomy with lumbarization of S1. Mild L5-S1 degenerative disc disease. Surgical clips in the right upper quadrant consistent with prior cholecystectomy.  IMPRESSION: 1. Normal bowel gas pattern. 2. Transitional anatomy with lumbarization of S1 and L5-S1 degenerative disc disease.   Electronically  Signed By: Jacqulynn Cadet M.D. On: 08/04/2015 20:47  I, Marylene Land, personally viewed and evaluated these images (plain radiographs) as part of my medical decision making, as well as reviewing the written report by the radiologist.  ____________________________________________   INITIAL IMPRESSION / [place] / ED COURSE  Pertinent labs & imaging results that were available during my care of the patient were reviewed by me and considered in my medical decision making (see chart for details).  Well-appearing patient. No acute distress. Presents for complaint of intermittent left flank and left lower quadrant pain 2-3 days. Denies current pain. Reports pain is coming in waves. Abdomen soft and nontender at this time. Minimal left CVA tenderness by palpation and  nonreproducible by movements per patient. Denies fall or trauma. Denies fevers or dysuria. Subjective report concerning for nephrolithiasis. Will evaluate labs as well as KUB and urinalysis.  Urinalysis few bacteria, 2+ bilirubin, 6-30 squamous epithelial cells, concern for contamination, will culture. Labs reviewed. WBC 8.5. Hemoglobin noted at 11.6. Labs otherwise unremarkable. KUB per radiologist's normal bowel gas pattern, transitional anatomy with lumbarization of S1 and L5-S1 degenerative disc disease.  2100: Patient continues to deny pain at this time. Very well-appearing patient. Reports continues to eat and drink well. Abdomen and flank reexamined, nontender at this time. Afebrile. Patient states she has not had any pain reoccurrence since waiting in exam room. Patient denies pain at this time. Patient subjective reports concerning for nephrolithiasis, though no stone on KUB, will give urine strainer. Also with few bacteria present and urinalysis with epithelial cells present concern for contamination, will culture, consider waiting for culture report will treat patient with oral 3 day course of Cipro, as sulfa  allergic. Patient states that she does not tolerate pain medications well and states that she does not want pain medication prescription.  Discussed in detail with patient patient may need further evaluation including imaging such as CT scan of abdomen. As patient stable at this time and denies complaints, recommend close PCP follow-up and patient states she does not want to go to ER at this time.  Patient states that she will follow-up with her PCP Dr. Army Melia tomorrow. Directed patient that if pain returns or has any changes in abdominal pain, abdominal pain radiation, chest pain,  shortness of breath, fever, or worsening concerns then to proceed directly to the emergency room for further evaluation and treatment.  Discussed follow up with Primary care physician this week. Discussed follow up and return parameters including no resolution or any worsening concerns. Patient verbalized understanding and agreed to plan.   ____________________________________________   FINAL CLINICAL IMPRESSION(S) / ED DIAGNOSES  Final diagnoses:  Left flank pain  Left sided abdominal pain      Note: This dictation was prepared with Dragon dictation along with smaller phrase technology. Any transcriptional errors that result from this process are unintentional.    Marylene Land, NP 08/04/15 2233

## 2015-08-04 NOTE — Discharge Instructions (Signed)
Rest. Drink plenty of fluids. Use urine strainer. Take medication as prescribed.   Follow up with your primary care physician tomorrow. Return to Urgent care or go to ER for abdominal pain, fever, vomiting, chest pain, shortness of breath, new or worsening concerns.    Flank Pain Flank pain refers to pain that is located on the side of the body between the upper abdomen and the back. The pain may occur over a short period of time (acute) or may be long-term or reoccurring (chronic). It may be mild or severe. Flank pain can be caused by many things. CAUSES  Some of the more common causes of flank pain include:  Muscle strains.   Muscle spasms.   A disease of your spine (vertebral disk disease).   A lung infection (pneumonia).   Fluid around your lungs (pulmonary edema).   A kidney infection.   Kidney stones.   A very painful skin rash caused by the chickenpox virus (shingles).   Gallbladder disease.  [place] care will depend on the cause of your pain. In general,  Rest as directed by your caregiver.  Drink enough fluids to keep your urine clear or pale yellow.  Only take over-the-counter or prescription medicines as directed by your caregiver. Some medicines may help relieve the pain.  Tell your caregiver about any changes in your pain.  Follow up with your caregiver as directed. SEEK IMMEDIATE MEDICAL CARE IF:   Your pain is not controlled with medicine.   You have new or worsening symptoms.  Your pain increases.   You have abdominal pain.   You have shortness of breath.   You have persistent nausea or vomiting.   You have swelling in your abdomen.   You feel faint or pass out.   You have blood in your urine.  You have a fever or persistent symptoms for more than 2-3 days.  You have a fever and your symptoms suddenly get worse. MAKE SURE YOU:   Understand these instructions.  Will watch your condition.  Will get  help right away if you are not doing well or get worse.   This information is not intended to replace advice given to you by your health care provider. Make sure you discuss any questions you have with your health care provider.   Document Released: 07/29/2005 Document Revised: 03/01/2012 Document Reviewed: 01/20/2012 Elsevier Interactive Patient Education [employer].

## 2015-08-04 NOTE — ED Notes (Signed)
Patient started having back pain this past Saturday that localized LLQ Sunday and today. Patient has a history of diverticulitis. Patient is still having BM's, but with less regularity.

## 2015-08-06 LAB — URINE CULTURE

## 2015-08-07 DIAGNOSIS — I471 Supraventricular tachycardia: Secondary | ICD-10-CM | POA: Insufficient documentation

## 2015-09-15 DIAGNOSIS — I350 Nonrheumatic aortic (valve) stenosis: Secondary | ICD-10-CM | POA: Insufficient documentation

## 2015-10-10 ENCOUNTER — Encounter: Payer: Self-pay | Admitting: *Deleted

## 2015-10-13 ENCOUNTER — Encounter: Payer: Self-pay | Admitting: *Deleted

## 2015-10-13 ENCOUNTER — Encounter
Admission: RE | Disposition: A | Discharge status: Home / Self Care | Payer: Self-pay | Source: Ambulatory Visit | Attending: Gastroenterology

## 2015-10-13 ENCOUNTER — Ambulatory Visit: Payer: BC Managed Care – PPO | Admitting: Anesthesiology

## 2015-10-13 ENCOUNTER — Ambulatory Visit
Admission: RE | Admit: 2015-10-13 | Discharge: 2015-10-13 | Disposition: A | Discharge status: Home / Self Care | Payer: BC Managed Care – PPO | Source: Ambulatory Visit | Attending: Gastroenterology | Admitting: Gastroenterology

## 2015-10-13 DIAGNOSIS — E7800 Pure hypercholesterolemia, unspecified: Secondary | ICD-10-CM | POA: Diagnosis not present

## 2015-10-13 DIAGNOSIS — G4730 Sleep apnea, unspecified: Secondary | ICD-10-CM | POA: Diagnosis not present

## 2015-10-13 DIAGNOSIS — Z8371 Family history of colonic polyps: Secondary | ICD-10-CM | POA: Insufficient documentation

## 2015-10-13 DIAGNOSIS — K219 Gastro-esophageal reflux disease without esophagitis: Secondary | ICD-10-CM | POA: Insufficient documentation

## 2015-10-13 DIAGNOSIS — K5730 Diverticulosis of large intestine without perforation or abscess without bleeding: Secondary | ICD-10-CM | POA: Insufficient documentation

## 2015-10-13 DIAGNOSIS — Z882 Allergy status to sulfonamides status: Secondary | ICD-10-CM | POA: Insufficient documentation

## 2015-10-13 DIAGNOSIS — Z79899 Other long term (current) drug therapy: Secondary | ICD-10-CM | POA: Insufficient documentation

## 2015-10-13 DIAGNOSIS — I10 Essential (primary) hypertension: Secondary | ICD-10-CM | POA: Diagnosis not present

## 2015-10-13 DIAGNOSIS — Z885 Allergy status to narcotic agent status: Secondary | ICD-10-CM | POA: Insufficient documentation

## 2015-10-13 DIAGNOSIS — Z9851 Tubal ligation status: Secondary | ICD-10-CM | POA: Diagnosis not present

## 2015-10-13 DIAGNOSIS — Z91013 Allergy to seafood: Secondary | ICD-10-CM | POA: Diagnosis not present

## 2015-10-13 DIAGNOSIS — Z1211 Encounter for screening for malignant neoplasm of colon: Secondary | ICD-10-CM | POA: Insufficient documentation

## 2015-10-13 DIAGNOSIS — Z9049 Acquired absence of other specified parts of digestive tract: Secondary | ICD-10-CM | POA: Insufficient documentation

## 2015-10-13 DIAGNOSIS — I739 Peripheral vascular disease, unspecified: Secondary | ICD-10-CM | POA: Diagnosis not present

## 2015-10-13 HISTORY — PX: COLONOSCOPY WITH PROPOFOL: SHX5780

## 2015-10-13 SURGERY — COLONOSCOPY WITH PROPOFOL
Anesthesia: General

## 2015-10-13 MED ORDER — FENTANYL CITRATE (PF) 100 MCG/2ML IJ SOLN
INTRAMUSCULAR | Status: DC | PRN
  Administered 2015-10-13: 50 ug via INTRAVENOUS

## 2015-10-13 MED ORDER — SODIUM CHLORIDE 0.9 % IV SOLN
INTRAVENOUS | Status: DC
  Administered 2015-10-13: 11:00:00 via INTRAVENOUS

## 2015-10-13 MED ORDER — SODIUM CHLORIDE 0.9 % IV SOLN
INTRAVENOUS | Status: DC
  Administered 2015-10-13: 13:00:00 via INTRAVENOUS

## 2015-10-13 MED ORDER — PROPOFOL 10 MG/ML IV BOLUS
INTRAVENOUS | Status: DC | PRN
  Administered 2015-10-13: 80 mg via INTRAVENOUS

## 2015-10-13 MED ORDER — SODIUM CHLORIDE 0.9 % IV SOLN: INTRAVENOUS | Status: DC

## 2015-10-13 MED ORDER — MIDAZOLAM HCL 2 MG/2ML IJ SOLN
INTRAMUSCULAR | Status: DC | PRN
  Administered 2015-10-13: 1 mg via INTRAVENOUS

## 2015-10-13 MED ORDER — PROPOFOL 500 MG/50ML IV EMUL
INTRAVENOUS | Status: DC | PRN
  Administered 2015-10-13: 120 ug/kg/min via INTRAVENOUS

## 2015-10-13 NOTE — H&P (Signed)
Primary Care Physician:  Halina Maidens, MD Primary Gastroenterologist:  Dr. Candace Cruise  Pre-Procedure History & Physical: HPI:  Holly Villegas is a 55 y.o. female is here for an colonoscopy.   Past Medical History  Diagnosis Date  . Hypertension   . Diverticulosis   . Hypercholesteremia   . GERD (gastroesophageal reflux disease)     Past Surgical History  Procedure Laterality Date  . Throat surgery  2011    polyp removed from vocal cord  . Esophageal dilation  07/2009  . Cholecystectomy  2012  . Tubal ligation  1991    Prior to Admission medications   Medication Sig Start Date End Date Taking? Authorizing Provider  acetaminophen (TYLENOL) 500 MG tablet Take 1 tablet by mouth once as needed.   Yes Historical Provider, MD  atorvastatin (LIPITOR) 20 MG tablet TAKE (1) TABLET BY MOUTH EVERY DAY 07/02/15  Yes Glean Hess, MD  azelastine (ASTELIN) 0.1 % nasal spray Place into both nostrils 2 (two) times daily. Use in each nostril as directed   Yes Historical Provider, MD  cetirizine (ZYRTEC) 10 MG tablet TAKE (1) TABLET BY MOUTH EVERY DAY 06/17/15  Yes Glean Hess, MD  esomeprazole (NEXIUM) 40 MG capsule Take 1 tablet by mouth daily. 06/27/13  Yes Historical Provider, MD  etodolac (LODINE) 500 MG tablet Take 1 tablet by mouth 2 (two) times daily as needed. 04/21/15  Yes Historical Provider, MD  fluticasone (FLONASE) 50 MCG/ACT nasal spray Place 2 sprays into both nostrils daily. 07/01/15  Yes Frederich Cha, MD  lisinopril-hydrochlorothiazide (PRINZIDE,ZESTORETIC) 20-12.5 MG tablet Take 1 tablet by mouth daily.   Yes Historical Provider, MD  Multiple Vitamin (MULTI-VITAMINS) TABS Take 1 tablet by mouth daily.   Yes Historical Provider, MD  norgestrel-ethinyl estradiol (LO/OVRAL,CRYSELLE) 0.3-30 MG-MCG tablet Take 1 tablet by mouth daily.   Yes Historical Provider, MD  Vitamin D, Ergocalciferol, (DRISDOL) 50000 units CAPS capsule Take 50,000 Units by mouth every 7 (seven) days.    Yes Historical Provider, MD    Allergies as of 10/06/2015 - Review Complete 08/04/2015  Allergen Reaction Noted  . Codeine sulfate Nausea And Vomiting 11/29/2014  . Shellfish-derived products  11/29/2014  . Sulfa antibiotics Nausea And Vomiting 11/28/2014    Family History  Problem Relation Age of Onset  . Hypertension Mother   . Migraines Mother   . Peptic Ulcer Disease Father   . Breast cancer Maternal Aunt 4    Social History   Social History  . Marital Status: Married    Spouse Name: N/A  . Number of Children: N/A  . Years of Education: N/A   Occupational History  . Not on file.   Social History Main Topics  . Smoking status: Never Smoker   . Smokeless tobacco: Never Used  . Alcohol Use: 0.0 oz/week    0 Standard drinks or equivalent per week     Comment: social  . Drug Use: No  . Sexual Activity: Not on file   Other Topics Concern  . Not on file   Social History Narrative    Review of Systems: See HPI, otherwise negative ROS  Physical Exam: BP 153/74 mmHg  Pulse 56  Temp(Src) 97.7 F (36.5 C) (Tympanic)  Resp 20  Ht 5\' 5"  (1.651 m)  Wt 180 lb (81.647 kg)  BMI 29.95 kg/m2  SpO2 100% General:   Alert,  pleasant and cooperative in NAD Head:  Normocephalic and atraumatic. Neck:  Supple; no masses or thyromegaly. Lungs:  Clear throughout to auscultation.    Heart:  Regular rate and rhythm. Abdomen:  Soft, nontender and nondistended. Normal bowel sounds, without guarding, and without rebound.   Neurologic:  Alert and  oriented x4;  grossly normal neurologically.  Impression/Plan: Holly Villegas is here for an EGD/colonoscopy to be performed for family hx of colon polyps  Risks, benefits, limitations, and alternatives regarding  colonoscopy have been reviewed with the patient.  Questions have been answered.  All parties agreeable.   Holly Villegas, Holly Dawn, MD  10/13/2015, 11:52 AM

## 2015-10-13 NOTE — Anesthesia Preprocedure Evaluation (Signed)
Anesthesia Evaluation  Patient identified by MRN, date of birth, ID band Patient awake    Reviewed: Allergy & Precautions, H&P , NPO status , Patient's Chart, lab work & pertinent test results  History of Anesthesia Complications Negative for: history of anesthetic complications  Airway Mallampati: III  TM Distance: >3 FB Neck ROM: full    Dental  (+) Poor Dentition   Pulmonary sleep apnea ,    Pulmonary exam normal breath sounds clear to auscultation       Cardiovascular Exercise Tolerance: Good hypertension, (-) angina+ Peripheral Vascular Disease  (-) Past MI and (-) DOE Normal cardiovascular exam Rhythm:regular Rate:Normal     Neuro/Psych  Neuromuscular disease negative psych ROS   GI/Hepatic Neg liver ROS, GERD  Controlled,  Endo/Other  negative endocrine ROS  Renal/GU Renal disease  negative genitourinary   Musculoskeletal   Abdominal   Peds  Hematology negative hematology ROS (+)   Anesthesia Other Findings Past Medical History:   Hypertension                                                 Diverticulosis                                               Hypercholesteremia                                           GERD (gastroesophageal reflux disease)                      Past Surgical History:   THROAT SURGERY                                   2011           Comment:polyp removed from vocal cord   ESOPHAGEAL DILATION                              07/2009      CHOLECYSTECTOMY                                  2012         TUBAL LIGATION                                   1991        BMI    Body Mass Index   29.95 kg/m 2      Reproductive/Obstetrics negative OB ROS                             Anesthesia Physical Anesthesia Plan  ASA: III  Anesthesia Plan: General   Post-op Pain Management:    Induction:   Airway Management Planned:   Additional Equipment:    Intra-op Plan:  Post-operative Plan:   Informed Consent: I have reviewed the patients History and Physical, chart, labs and discussed the procedure including the risks, benefits and alternatives for the proposed anesthesia with the patient or authorized representative who has indicated his/her understanding and acceptance.   Dental Advisory Given  Plan Discussed with: Anesthesiologist, CRNA and Surgeon  Anesthesia Plan Comments:         Anesthesia Quick Evaluation

## 2015-10-13 NOTE — Op Note (Signed)
[facility]enter[facility] Patient Name: Holly Villegas Procedure Date: 10/13/2015 12:29 PM MRN: [id number] Account #: [id number] Date of Birth: Oct 30, 1960 Admit Type: Outpatient Age: 55 Room: [facility] ENDO ROOM 4 Gender: Female Note Status: Finalized Procedure:            Colonoscopy Indications:          Family history of colonic polyps in a first-degree                        relative, No prior hx of colon polyps Providers:            Lupita Dawn. Candace Cruise, MD Referring MD:         Halina Maidens, MD (Referring MD) Medicines:            Monitored Anesthesia Care Complications:        No immediate complications. Procedure:            Pre-Anesthesia Assessment:                       - Prior to the procedure, a History and Physical was                        performed, and patient medications, allergies and                        sensitivities were reviewed. The patient's tolerance of                        previous anesthesia was reviewed.                       - The risks and benefits of the procedure and the                        sedation options and risks were discussed with the                        patient. All questions were answered and informed                        consent was obtained.                       - The risks and benefits of the procedure and the                        sedation options and risks were discussed with the                        patient. All questions were answered and informed                        consent was obtained.                       - After reviewing the risks and benefits, the patient                        was deemed in satisfactory condition to undergo the  procedure.                       After obtaining informed consent, the colonoscope was                        passed under direct vision. Throughout the procedure,                        the patient's blood pressure, pulse, and oxygen          saturations were monitored continuously. The                        Colonoscope was introduced through the anus and                        advanced to the the cecum, identified by appendiceal                        orifice and ileocecal valve. The colonoscopy was                        performed without difficulty. The patient tolerated the                        procedure well. Findings:      Multiple small and large-mouthed diverticula were found in the sigmoid       colon.      The exam was otherwise without abnormality. Impression:           - Diverticulosis in the sigmoid colon.                       - The examination was otherwise normal.                       - No specimens collected. Recommendation:       - Discharge patient to home.                       - Repeat colonoscopy in 5 years for surveillance based                        on pathology results.                       - The findings and recommendations were discussed with                        the patient. Procedure Code(s):    --- Professional ---                       [phone], Colonoscopy, flexible; diagnostic, including                        collection of specimen(s) by brushing or washing, when                        performed (separate procedure) Diagnosis Code(s):    --- Professional ---  Z83.71, Family history of colonic polyps                       K57.30, Diverticulosis of large intestine without                        perforation or abscess without bleeding CPT copyright 2016 American Medical Association. All rights reserved. The codes documented in this report are preliminary and upon coder review may  be revised to meet current compliance requirements. Hulen Luster, MD 10/13/2015 12:54:55 PM This report has been signed electronically. Number of Addenda: 0 Note Initiated On: 10/13/2015 12:29 PM Scope Withdrawal Time: 0 hours 7 minutes 32 seconds  Total Procedure Duration: 0 hours 11  minutes 24 seconds       [facility]

## 2015-10-13 NOTE — Anesthesia Postprocedure Evaluation (Signed)
Anesthesia Post Note  Patient: Holly Villegas  Procedure(s) Performed: Procedure(s) (LRB): COLONOSCOPY WITH PROPOFOL (N/A)  Patient location during evaluation: Endoscopy Anesthesia Type: General Level of consciousness: awake and alert Pain management: pain level controlled Vital Signs Assessment: post-procedure vital signs reviewed and stable Respiratory status: spontaneous breathing, nonlabored ventilation, respiratory function stable and patient connected to nasal cannula oxygen Cardiovascular status: blood pressure returned to baseline and stable Postop Assessment: no signs of nausea or vomiting Anesthetic complications: no    Last Vitals:  Filed Vitals:   10/13/15 1326 10/13/15 1336  BP: 151/75 155/78  Pulse: 57 55  Temp:    Resp: 18 17    Last Pain:  Filed Vitals:   10/13/15 1337  PainSc: 0-No pain                 Jamina Macbeth S

## 2015-10-13 NOTE — Transfer of Care (Signed)
Immediate Anesthesia Transfer of Care Note  Patient: Holly Villegas  Procedure(s) Performed: Procedure(s): COLONOSCOPY WITH PROPOFOL (N/A)  Patient Location: PACU and Endoscopy Unit  Anesthesia Type:General  Level of Consciousness: sedated and responds to stimulation  Airway & Oxygen Therapy: Patient Spontanous Breathing and Patient connected to nasal cannula oxygen  Post-op Assessment: Report given to RN and Post -op Vital signs reviewed and stable  Post vital signs: Reviewed and stable  Last Vitals:  Filed Vitals:   10/13/15 1112 10/13/15 1256  BP: 153/74 128/85  Pulse: 56 69  Temp: 36.5 C   Resp: 20     Complications: No apparent anesthesia complications

## 2015-10-15 ENCOUNTER — Encounter: Payer: Self-pay | Admitting: Gastroenterology

## 2015-11-29 ENCOUNTER — Other Ambulatory Visit: Payer: Self-pay | Admitting: Internal Medicine

## 2015-11-30 ENCOUNTER — Encounter: Payer: Self-pay | Admitting: Internal Medicine

## 2015-12-16 ENCOUNTER — Other Ambulatory Visit: Payer: Self-pay | Admitting: Obstetrics and Gynecology

## 2015-12-16 DIAGNOSIS — Z1231 Encounter for screening mammogram for malignant neoplasm of breast: Secondary | ICD-10-CM

## 2015-12-17 ENCOUNTER — Encounter: Payer: Self-pay | Admitting: Internal Medicine

## 2015-12-17 ENCOUNTER — Ambulatory Visit (INDEPENDENT_AMBULATORY_CARE_PROVIDER_SITE_OTHER): Payer: BC Managed Care – PPO | Admitting: Internal Medicine

## 2015-12-17 VITALS — BP 120/68 | HR 64 | Resp 16 | Ht 65.0 in | Wt 188.0 lb

## 2015-12-17 DIAGNOSIS — I10 Essential (primary) hypertension: Secondary | ICD-10-CM

## 2015-12-17 DIAGNOSIS — M722 Plantar fascial fibromatosis: Secondary | ICD-10-CM

## 2015-12-17 DIAGNOSIS — E785 Hyperlipidemia, unspecified: Secondary | ICD-10-CM | POA: Diagnosis not present

## 2015-12-17 DIAGNOSIS — S46911A Strain of unspecified muscle, fascia and tendon at shoulder and upper arm level, right arm, initial encounter: Secondary | ICD-10-CM | POA: Insufficient documentation

## 2015-12-17 DIAGNOSIS — I471 Supraventricular tachycardia: Secondary | ICD-10-CM

## 2015-12-17 NOTE — Progress Notes (Signed)
Date:  12/17/2015   Name:  Holly Villegas   DOB:  1961/04/25   MRN:  [id number]   Chief Complaint: Hypertension; Arm Pain; and Hyperlipidemia Hypertension This is a chronic problem. The current episode started more than 1 year ago. The problem is unchanged. The problem is controlled. Pertinent negatives include no chest pain, headaches, palpitations or shortness of breath.  Arm Pain  The incident occurred more than 1 week ago. The incident occurred at home. Injury mechanism: while doing water aerobics. The pain is present in the upper right arm. The quality of the pain is described as aching. The pain does not radiate. The pain has been improving since the incident. Pertinent negatives include no chest pain or numbness. She has tried NSAIDs for the symptoms. The treatment provided moderate relief.  Hyperlipidemia This is a chronic problem. The current episode started more than 1 year ago. The problem is controlled. Recent lipid tests were reviewed and are normal. Associated symptoms include myalgias. Pertinent negatives include no chest pain or shortness of breath. Current antihyperlipidemic treatment includes statins. The current treatment provides significant improvement of lipids.  PSVT - ECHO showed mild AR.  Medication was changed to plain lisinopril.  She has had no further symptoms.    Review of Systems  Constitutional: Negative for fever, appetite change, fatigue and unexpected weight change.  HENT: Negative for tinnitus and trouble swallowing.   Respiratory: Negative for cough, chest tightness and shortness of breath.   Cardiovascular: Negative for chest pain, palpitations and leg swelling.  Gastrointestinal: Negative for abdominal pain.  Endocrine: Negative for polydipsia and polyuria.  Genitourinary: Negative for dysuria and hematuria.  Musculoskeletal: Positive for myalgias. Negative for back pain, arthralgias and gait problem.  Neurological: Negative for tremors,  numbness and headaches.  Psychiatric/Behavioral: Negative for dysphoric mood.    Patient Active Problem List   Diagnosis Date Noted  . Aortic heart valve narrowing 09/15/2015  . Paroxysmal supraventricular tachycardia ([place]) 08/07/2015  . Heart palpitations 07/07/2015  . Awareness of heartbeats 07/07/2015  . Left wrist tendonitis 06/09/2015  . Plantar fasciitis, bilateral 11/29/2014  . Vitamin D deficiency 11/29/2014  . Dupuytren's contracture of foot 11/29/2014  . Dyslipidemia 11/28/2014  . Environmental and seasonal allergies 11/28/2014  . Raynaud's syndrome without gangrene 11/28/2014  . Carpal tunnel syndrome 11/28/2014  . Benign colonic polyp 11/28/2014  . Diverticulosis of colon without diverticulitis 11/28/2014  . Essential hypertension 11/28/2014  . Impaired renal function 11/28/2014  . Asymptomatic varicose veins 11/28/2014    Prior to Admission medications   Medication Sig Start Date End Date Taking? Authorizing Provider  acetaminophen (TYLENOL) 500 MG tablet Take 1 tablet by mouth once as needed.   Yes Historical Provider, MD  atorvastatin (LIPITOR) 20 MG tablet TAKE (1) TABLET BY MOUTH EVERY DAY 11/30/15  Yes Glean Hess, MD  azelastine (ASTELIN) 0.1 % nasal spray Place into both nostrils 2 (two) times daily. Use in each nostril as directed   Yes Historical Provider, MD  cetirizine (ZYRTEC) 10 MG tablet TAKE (1) TABLET BY MOUTH EVERY DAY 06/17/15  Yes Glean Hess, MD  esomeprazole (NEXIUM) 40 MG capsule Take 1 tablet by mouth daily. 06/27/13  Yes Historical Provider, MD  etodolac (LODINE) 500 MG tablet Take 1 tablet by mouth 2 (two) times daily as needed. 04/21/15  Yes Historical Provider, MD  fluticasone (FLONASE) 50 MCG/ACT nasal spray Place 2 sprays into both nostrils daily. 07/01/15  Yes Frederich Cha, MD  lisinopril (PRINIVIL,ZESTRIL) 10  MG tablet  11/29/15  Yes Historical Provider, MD  Multiple Vitamin (MULTI-VITAMINS) TABS Take 1 tablet by mouth daily.   Yes  Historical Provider, MD  norgestrel-ethinyl estradiol (LO/OVRAL,CRYSELLE) 0.3-30 MG-MCG tablet Take 1 tablet by mouth daily.   Yes Historical Provider, MD  Vitamin D, Ergocalciferol, (DRISDOL) 50000 units CAPS capsule Take 50,000 Units by mouth every 7 (seven) days.   Yes Historical Provider, MD    Allergies  Allergen Reactions  . Codeine Sulfate Nausea And Vomiting  . Shellfish-Derived Products   . Sulfa Antibiotics Nausea And Vomiting    Past Surgical History  Procedure Laterality Date  . Throat surgery  2011    polyp removed from vocal cord  . Esophageal dilation  07/2009  . Cholecystectomy  2012  . Tubal ligation  1991  . Colonoscopy with propofol N/A 10/13/2015    Procedure: COLONOSCOPY WITH PROPOFOL;  Surgeon: Hulen Luster, MD;  Location: [facility] ENDOSCOPY;  Service: Gastroenterology;  Laterality: N/A;    Social History  Substance Use Topics  . Smoking status: Never Smoker   . Smokeless tobacco: Never Used  . Alcohol Use: 0.0 oz/week    0 Standard drinks or equivalent per week     Comment: social     Medication list has been reviewed and updated.   Physical Exam  Constitutional: She is oriented to person, place, and time. She appears well-developed. No distress.  HENT:  Head: Normocephalic and atraumatic.  Cardiovascular: Normal rate, regular rhythm and normal heart sounds.   Pulmonary/Chest: Effort normal and breath sounds normal. No respiratory distress.  Musculoskeletal: Normal range of motion.       Right shoulder: She exhibits tenderness (over mid posterior triceps).  Neurological: She is alert and oriented to person, place, and time. She has normal strength and normal reflexes.  Skin: Skin is warm and dry. No rash noted.  Psychiatric: She has a normal mood and affect. Her behavior is normal. Thought content normal.  Nursing note and vitals reviewed.   BP 120/68 mmHg  Pulse 64  Resp 16  Ht 5\' 5"  (1.651 m)  Wt 188 lb (85.276 kg)  BMI 31.28 kg/m2  SpO2  98%  Assessment and Plan: 1. Essential hypertension controlled - Comprehensive metabolic panel  2. Paroxysmal supraventricular tachycardia (HCC) Improved without recent episodes  3. Dyslipidemia On statin therapy - Comprehensive metabolic panel  4. Plantar fasciitis, bilateral On daily NSAIDS CMP ordered  5. Muscle strain of right upper arm, initial encounter Recommend limited activities Heat, etodlac Follow up if needed   Halina Maidens, MD [facility]  12/17/2015

## 2015-12-18 LAB — COMPREHENSIVE METABOLIC PANEL
ALT: 11 IU/L (ref 0–32)
AST: 16 IU/L (ref 0–40)
Albumin/Globulin Ratio: 1.7 (ref 1.2–2.2)
Albumin: 4.1 g/dL (ref 3.5–5.5)
Alkaline Phosphatase: 70 IU/L (ref 39–117)
BILIRUBIN TOTAL: 0.3 mg/dL (ref 0.0–1.2)
BUN/Creatinine Ratio: 15 (ref 9–23)
BUN: 13 mg/dL (ref 6–24)
CALCIUM: 9.1 mg/dL (ref 8.7–10.2)
CHLORIDE: 100 mmol/L (ref 96–106)
CO2: 23 mmol/L (ref 18–29)
Creatinine, Ser: 0.88 mg/dL (ref 0.57–1.00)
GFR calc non Af Amer: 73 mL/min/{1.73_m2} (ref >59)
GFR, EST AFRICAN AMERICAN: 84 mL/min/{1.73_m2} (ref >59)
GLUCOSE: 93 mg/dL (ref 65–99)
Globulin, Total: 2.4 g/dL (ref 1.5–4.5)
Potassium: 4.6 mmol/L (ref 3.5–5.2)
Sodium: 140 mmol/L (ref 134–144)
TOTAL PROTEIN: 6.5 g/dL (ref 6.0–8.5)

## 2016-01-20 ENCOUNTER — Ambulatory Visit
Admission: RE | Admit: 2016-01-20 | Discharge: 2016-01-20 | Disposition: A | Discharge status: Home / Self Care | Payer: BC Managed Care – PPO | Source: Ambulatory Visit | Attending: Obstetrics and Gynecology | Admitting: Obstetrics and Gynecology

## 2016-01-20 ENCOUNTER — Other Ambulatory Visit: Payer: Self-pay | Admitting: Obstetrics and Gynecology

## 2016-01-20 DIAGNOSIS — R928 Other abnormal and inconclusive findings on diagnostic imaging of breast: Secondary | ICD-10-CM | POA: Insufficient documentation

## 2016-01-20 DIAGNOSIS — Z1231 Encounter for screening mammogram for malignant neoplasm of breast: Secondary | ICD-10-CM | POA: Insufficient documentation

## 2016-01-23 ENCOUNTER — Other Ambulatory Visit: Payer: Self-pay | Admitting: Obstetrics and Gynecology

## 2016-01-23 DIAGNOSIS — N6320 Unspecified lump in the left breast, unspecified quadrant: Secondary | ICD-10-CM

## 2016-02-03 ENCOUNTER — Other Ambulatory Visit: Payer: Self-pay | Admitting: Internal Medicine

## 2016-02-13 ENCOUNTER — Ambulatory Visit
Admission: RE | Admit: 2016-02-13 | Discharge: 2016-02-13 | Disposition: A | Discharge status: Home / Self Care | Payer: BC Managed Care – PPO | Source: Ambulatory Visit | Attending: Obstetrics and Gynecology | Admitting: Obstetrics and Gynecology

## 2016-02-13 DIAGNOSIS — N63 Unspecified lump in breast: Secondary | ICD-10-CM | POA: Diagnosis not present

## 2016-02-13 DIAGNOSIS — N6320 Unspecified lump in the left breast, unspecified quadrant: Secondary | ICD-10-CM

## 2016-02-17 ENCOUNTER — Other Ambulatory Visit: Payer: Self-pay | Admitting: Obstetrics and Gynecology

## 2016-02-17 DIAGNOSIS — N6320 Unspecified lump in the left breast, unspecified quadrant: Secondary | ICD-10-CM

## 2016-02-24 ENCOUNTER — Encounter (INDEPENDENT_AMBULATORY_CARE_PROVIDER_SITE_OTHER): Payer: Self-pay

## 2016-02-24 ENCOUNTER — Ambulatory Visit
Admission: RE | Admit: 2016-02-24 | Discharge: 2016-02-24 | Disposition: A | Discharge status: Home / Self Care | Payer: BC Managed Care – PPO | Source: Ambulatory Visit | Attending: Obstetrics and Gynecology | Admitting: Obstetrics and Gynecology

## 2016-02-24 ENCOUNTER — Other Ambulatory Visit: Payer: Self-pay | Admitting: Obstetrics and Gynecology

## 2016-02-24 DIAGNOSIS — N63 Unspecified lump in breast: Secondary | ICD-10-CM | POA: Insufficient documentation

## 2016-02-24 DIAGNOSIS — N6320 Unspecified lump in the left breast, unspecified quadrant: Secondary | ICD-10-CM

## 2016-02-24 HISTORY — PX: BREAST BIOPSY: SHX20

## 2016-03-03 LAB — SURGICAL PATHOLOGY

## 2016-03-08 ENCOUNTER — Encounter: Payer: Self-pay | Admitting: *Deleted

## 2016-03-08 NOTE — Progress Notes (Signed)
  Oncology Nurse Navigator Documentation  Navigator Location: [facility] (03/08/16 1500) Navigator Encounter Type: Introductory phone call (03/08/16 1500)    Patient has been scheduled to see a surgeon at [facility] by her PCP.  No needs at this time.                                      Time Spent with Patient: 30 (03/08/16 1500)

## 2016-03-17 DIAGNOSIS — R928 Other abnormal and inconclusive findings on diagnostic imaging of breast: Secondary | ICD-10-CM | POA: Insufficient documentation

## 2016-03-26 ENCOUNTER — Encounter: Payer: Self-pay | Admitting: Internal Medicine

## 2016-03-26 DIAGNOSIS — Z853 Personal history of malignant neoplasm of breast: Secondary | ICD-10-CM | POA: Insufficient documentation

## 2016-06-11 ENCOUNTER — Ambulatory Visit
Admission: RE | Admit: 2016-06-11 | Discharge: 2016-06-11 | Disposition: A | Discharge status: Home / Self Care | Payer: BC Managed Care – PPO | Source: Ambulatory Visit | Attending: Internal Medicine | Admitting: Internal Medicine

## 2016-06-11 ENCOUNTER — Ambulatory Visit (INDEPENDENT_AMBULATORY_CARE_PROVIDER_SITE_OTHER): Payer: BC Managed Care – PPO | Admitting: Internal Medicine

## 2016-06-11 ENCOUNTER — Encounter: Payer: Self-pay | Admitting: Internal Medicine

## 2016-06-11 VITALS — BP 144/80 | HR 42 | Temp 97.6°F | Ht 65.0 in | Wt 199.0 lb

## 2016-06-11 DIAGNOSIS — M5431 Sciatica, right side: Secondary | ICD-10-CM

## 2016-06-11 DIAGNOSIS — C50412 Malignant neoplasm of upper-outer quadrant of left female breast: Secondary | ICD-10-CM | POA: Diagnosis not present

## 2016-06-11 DIAGNOSIS — I10 Essential (primary) hypertension: Secondary | ICD-10-CM | POA: Diagnosis not present

## 2016-06-11 DIAGNOSIS — M5432 Sciatica, left side: Secondary | ICD-10-CM | POA: Insufficient documentation

## 2016-06-11 DIAGNOSIS — I499 Cardiac arrhythmia, unspecified: Secondary | ICD-10-CM | POA: Diagnosis not present

## 2016-06-11 DIAGNOSIS — Z170 Estrogen receptor positive status [ER+]: Secondary | ICD-10-CM | POA: Diagnosis not present

## 2016-06-11 MED ORDER — PREDNISONE 10 MG PO TABS: ORAL_TABLET | ORAL | 0 refills | Status: DC

## 2016-06-11 NOTE — Progress Notes (Signed)
Date:  06/11/2016   Name:  Holly Villegas   DOB:  [birth year]   MRN:  [id number]   Chief Complaint: Leg Pain (Pt stated having leg nerve pain and numbness on the hands/arms) Started about 2 months ago after second breast surgery.  Burning pain from both posterior hips to lateral thighs and inner thighs.  She says it feels like sciatica.  She has had some relief with Advil and sitting down.  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  No dizziness or headache.  She has been under some stress recently.   Review of Systems  Constitutional: Negative for chills, fatigue and fever.  Respiratory: Positive for chest tightness. Negative for cough, shortness of breath and wheezing.   Cardiovascular: Negative for chest pain, palpitations and leg swelling.  Musculoskeletal: Positive for arthralgias and myalgias (sciatica). Negative for joint swelling.  Skin: Negative for color change and rash.  Neurological: Negative for dizziness, light-headedness and headaches.  Psychiatric/Behavioral: Negative for sleep disturbance.    Patient Active Problem List   Diagnosis Date Noted  . Malignant neoplasm of upper-outer quadrant of left breast in female, estrogen receptor positive ([place]) 03/26/2016  . Abnormal mammogram of left breast 03/17/2016  . Muscle strain of right upper arm 12/17/2015  . Aortic heart valve narrowing 09/15/2015  . Paroxysmal supraventricular tachycardia ([place]) 08/07/2015  . Heart palpitations 07/07/2015  . Awareness of heartbeats 07/07/2015  . Left wrist tendonitis 06/09/2015  . Plantar fasciitis, bilateral 11/29/2014  . Vitamin D deficiency 11/29/2014  . Dupuytren's contracture of foot 11/29/2014  . Dyslipidemia 11/28/2014  . Environmental and seasonal allergies 11/28/2014  . Raynaud's syndrome without gangrene 11/28/2014  . Carpal tunnel syndrome 11/28/2014  . Benign colonic polyp 11/28/2014  .  Diverticulosis of colon without diverticulitis 11/28/2014  . Essential hypertension 11/28/2014  . Impaired renal function 11/28/2014  . Asymptomatic varicose veins 11/28/2014    Prior to Admission medications   Medication Sig Start Date End Date Taking? Authorizing Provider  acetaminophen (TYLENOL) 500 MG tablet Take 1 tablet by mouth once as needed.   Yes Historical Provider, MD  atorvastatin (LIPITOR) 20 MG tablet TAKE (1) TABLET BY MOUTH EVERY DAY 11/30/15  Yes Glean Hess, MD  azelastine (ASTELIN) 0.1 % nasal spray USE ONE SPRAY IN EACH NOSTRIL ONCE DAILYAS DIRECTED BY PHYSICIAN. 02/03/16  Yes Glean Hess, MD  cetirizine (ZYRTEC) 10 MG tablet TAKE (1) TABLET BY MOUTH EVERY DAY 06/17/15  Yes Glean Hess, MD  esomeprazole (NEXIUM) 40 MG capsule Take 1 tablet by mouth daily. 06/27/13  Yes Historical Provider, MD  etodolac (LODINE) 500 MG tablet Take 1 tablet by mouth 2 (two) times daily as needed. 04/21/15  Yes Historical Provider, MD  fluticasone (FLONASE) 50 MCG/ACT nasal spray Place 2 sprays into both nostrils daily. 07/01/15  Yes Frederich Cha, MD  lisinopril (PRINIVIL,ZESTRIL) 10 MG tablet  11/29/15  Yes Historical Provider, MD  Multiple Vitamin (MULTI-VITAMINS) TABS Take 1 tablet by mouth daily.   Yes Historical Provider, MD  Vitamin D, Ergocalciferol, (DRISDOL) 50000 units CAPS capsule Take 50,000 Units by mouth every 7 (seven) days.   Yes Historical Provider, MD    Allergies  Allergen Reactions  . Codeine Sulfate Nausea And Vomiting  . Shellfish-Derived Products   . Sulfa Antibiotics Nausea And Vomiting  . Brassica Oleracea Italica Nausea And Vomiting and Other (See Comments)    Does not digest and vomits up  Past Surgical History:  Procedure Laterality Date  . BREAST BIOPSY Left 02/24/2016   path pending  . CHOLECYSTECTOMY  2012  . COLONOSCOPY WITH PROPOFOL N/A 10/13/2015   Procedure: COLONOSCOPY WITH PROPOFOL;  Surgeon: Hulen Luster, MD;  Location: [facility] ENDOSCOPY;   Service: Gastroenterology;  Laterality: N/A;  . ESOPHAGEAL DILATION  07/2009  . THROAT SURGERY  2011   polyp removed from vocal cord  . TUBAL LIGATION  1991    Social History  Substance Use Topics  . Smoking status: Never Smoker  . Smokeless tobacco: Never Used  . Alcohol use 0.0 oz/week     Comment: social     Medication list has been reviewed and updated.   Physical Exam  Constitutional: She is oriented to person, place, and time. She appears well-developed. No distress.  HENT:  Head: Normocephalic and atraumatic.  Neck: Carotid bruit is not present.  Cardiovascular: Normal heart sounds and normal pulses.  Frequent extrasystoles are present.  Pulmonary/Chest: Effort normal. No respiratory distress. She has no decreased breath sounds. She has no wheezes.  Abdominal: There is no tenderness.  Musculoskeletal: Normal range of motion.  Neurological: She is alert and oriented to person, place, and time. She has normal reflexes.  SLR negative bilaterally  Skin: Skin is warm and dry. No rash noted.  Psychiatric: She has a normal mood and affect. Her speech is normal and behavior is normal. Thought content normal.  Nursing note and vitals reviewed.   BP (!) 144/80   Pulse (!) 42   Temp 97.6 F (36.4 C)   Ht 5\' 5"  (1.651 m)   Wt 199 lb (90.3 kg)   SpO2 99%   BMI 33.12 kg/m   Assessment and Plan: 1. Irregular heart rhythm Recommend reducing stress, limiting caffeine No treatment at this time due to borderline low HR - EKG 12-Lead - SR with frequent PVCs, HR 76 - TSH - Comprehensive metabolic panel  2. Essential hypertension controlled  3. Bilateral sciatica Trial of prednisone Xray lower back - predniSONE (DELTASONE) 10 MG tablet; Take 6 on day 1, 5 on day 2, 4 on day 3, 3 on day 4, 2 on day 5 and 1 on day 1 then stop.  Dispense: 21 tablet; Refill: 0 - DG Lumbar Spine Complete; Future  4. Malignant neoplasm of upper-outer quadrant of left breast in female,  estrogen receptor positive ([place]) Completely XRT without chemotherapy  Halina Maidens, MD [facility]  06/11/2016

## 2016-06-11 NOTE — Patient Instructions (Signed)
Ice lower back 15 minutes several times per day.

## 2016-06-12 LAB — COMPREHENSIVE METABOLIC PANEL
ALBUMIN: 4.2 g/dL (ref 3.5–5.5)
ALK PHOS: 101 IU/L (ref 39–117)
ALT: 38 IU/L — ABNORMAL HIGH (ref 0–32)
AST: 32 IU/L (ref 0–40)
Albumin/Globulin Ratio: 1.7 (ref 1.2–2.2)
BUN / CREAT RATIO: 21 (ref 9–23)
BUN: 15 mg/dL (ref 6–24)
Bilirubin Total: 0.3 mg/dL (ref 0.0–1.2)
CO2: 26 mmol/L (ref 18–29)
CREATININE: 0.71 mg/dL (ref 0.57–1.00)
Calcium: 9 mg/dL (ref 8.7–10.2)
Chloride: 99 mmol/L (ref 96–106)
GFR calc Af Amer: 109 mL/min/{1.73_m2} (ref >59)
GFR, EST NON AFRICAN AMERICAN: 95 mL/min/{1.73_m2} (ref >59)
GLOBULIN, TOTAL: 2.5 g/dL (ref 1.5–4.5)
Glucose: 124 mg/dL — ABNORMAL HIGH (ref 65–99)
Potassium: 4 mmol/L (ref 3.5–5.2)
SODIUM: 141 mmol/L (ref 134–144)
Total Protein: 6.7 g/dL (ref 6.0–8.5)

## 2016-06-12 LAB — TSH: TSH: 1.25 u[IU]/mL (ref 0.450–4.500)

## 2016-06-16 ENCOUNTER — Other Ambulatory Visit: Payer: Self-pay | Admitting: Internal Medicine

## 2016-06-16 DIAGNOSIS — M5136 Other intervertebral disc degeneration, lumbar region: Secondary | ICD-10-CM | POA: Insufficient documentation

## 2016-06-16 DIAGNOSIS — M51369 Other intervertebral disc degeneration, lumbar region without mention of lumbar back pain or lower extremity pain: Secondary | ICD-10-CM | POA: Insufficient documentation

## 2016-06-17 ENCOUNTER — Encounter: Payer: BC Managed Care – PPO | Admitting: Internal Medicine

## 2016-06-24 ENCOUNTER — Other Ambulatory Visit: Payer: Self-pay | Admitting: Internal Medicine

## 2016-11-21 ENCOUNTER — Encounter: Payer: Self-pay | Admitting: *Deleted

## 2016-11-21 ENCOUNTER — Ambulatory Visit
Admission: EM | Admit: 2016-11-21 | Discharge: 2016-11-21 | Disposition: A | Discharge status: Home / Self Care | Payer: BC Managed Care – PPO | Attending: Family Medicine | Admitting: Family Medicine

## 2016-11-21 DIAGNOSIS — R002 Palpitations: Secondary | ICD-10-CM

## 2016-11-21 DIAGNOSIS — C50919 Malignant neoplasm of unspecified site of unspecified female breast: Secondary | ICD-10-CM | POA: Diagnosis not present

## 2016-11-21 NOTE — Discharge Instructions (Signed)
-  contact your cardiologist for further evaluation and possible Holter monitoring. -would minimize caffeine intake until your visit with cardiology. -contact your oncologist by phone to report symptoms with her medication. -Should symptoms return will present to emergency room for further evaluation and treatment.

## 2016-11-21 NOTE — ED Provider Notes (Signed)
CSN: [id number]     Arrival date & time 11/21/16  1505 History   First MD Initiated Contact with Patient 11/21/16 1627     Chief Complaint  Patient presents with  . Tachycardia   (Consider location/radiation/quality/duration/timing/severity/associated sxs/prior Treatment) Patient is a 56 year female with a past medical history of breast cancer, currently on exemestane, GERD, and hypertension who presents with complaint of rapid heartbeat this afternoon as well as excess gas today. Patient states she had her typical Sunday, had a cover cover this morning went to church came home had lunch and drink a diet coke with plans. Patient states she laid down to take a nap and noted that she was having palpitations. She reported this is a rapid irregular heartbeat in the 120s based on her smart watch. Patient states she has seen Dr. Adolph Pollack with cardiology in the past  But it has been a while. Per chart review last visit was in March 2017. Patient is also followed with [facility] oncology for breast cancer. Her last follow up was this month. The exemestane is a newer medication for her.  Initially patient reported excess gas and belching earlier today which she took a antacid tablet for. Patient does have a history of GERD and is on Nexium.      Past Medical History:  Diagnosis Date  . Diverticulosis   . GERD (gastroesophageal reflux disease)   . Hypercholesteremia   . Hypertension    Past Surgical History:  Procedure Laterality Date  . BREAST BIOPSY Left 02/24/2016   path pending  . CHOLECYSTECTOMY  2012  . COLONOSCOPY WITH PROPOFOL N/A 10/13/2015   Procedure: COLONOSCOPY WITH PROPOFOL;  Surgeon: Hulen Luster, MD;  Location: [facility] ENDOSCOPY;  Service: Gastroenterology;  Laterality: N/A;  . ESOPHAGEAL DILATION  07/2009  . THROAT SURGERY  2011   polyp removed from vocal cord  . TUBAL LIGATION  1991   Family History  Problem Relation Age of Onset  . Hypertension Mother   . Migraines Mother   . Peptic  Ulcer Disease Father   . Breast cancer Maternal Aunt 22   Social History  Substance Use Topics  . Smoking status: Never Smoker  . Smokeless tobacco: Never Used  . Alcohol use 0.0 oz/week     Comment: social   OB History    No data available     Review of Systems  Respiratory: Negative for shortness of breath.   Cardiovascular: Positive for palpitations. Negative for chest pain.  Gastrointestinal: Negative for abdominal pain, nausea and vomiting.       Excess gas and belching  All other systems reviewed and are negative.   Allergies  Codeine sulfate; Shellfish-derived products; Sulfa antibiotics; and Brassica oleracea italica  Home Medications   Prior to Admission medications   Medication Sig Start Date End Date Taking? Authorizing Provider  aspirin 81 MG chewable tablet Chew 81 mg by mouth daily.   Yes [provider]  atorvastatin (LIPITOR) 20 MG tablet TAKE ONE TABLET AT BEDTIME. 06/24/16  Yes Glean Hess, MD  cetirizine (ZYRTEC) 10 MG tablet TAKE (1) TABLET BY MOUTH EVERY DAY 06/17/15  Yes Glean Hess, MD  esomeprazole (NEXIUM) 40 MG capsule Take 1 tablet by mouth daily. 06/27/13  Yes [provider]  lisinopril (PRINIVIL,ZESTRIL) 10 MG tablet  11/29/15  Yes [provider]  Multiple Vitamin (MULTI-VITAMINS) TABS Take 1 tablet by mouth daily.   Yes [provider]  Vitamin D, Ergocalciferol, (DRISDOL) 50000 units CAPS  capsule Take 50,000 Units by mouth every 7 (seven) days.   Yes [provider]  acetaminophen (TYLENOL) 500 MG tablet Take 1 tablet by mouth once as needed.    [provider]  azelastine (ASTELIN) 0.1 % nasal spray USE ONE SPRAY IN EACH NOSTRIL ONCE DAILYAS DIRECTED BY PHYSICIAN. 02/03/16   Glean Hess, MD  etodolac (LODINE) 500 MG tablet Take 1 tablet by mouth 2 (two) times daily as needed. 04/21/15   [provider]  fluticasone (FLONASE) 50 MCG/ACT nasal spray Place 2 sprays into  both nostrils daily. 07/01/15   Frederich Cha, MD  predniSONE (DELTASONE) 10 MG tablet Take 6 on day 1, 5 on day 2, 4 on day 3, 3 on day 4, 2 on day 5 and 1 on day 1 then stop. 06/11/16   Glean Hess, MD   Meds Ordered and Administered this Visit  Medications - No data to display  BP 133/74 (BP Location: Left Arm)   Pulse 70   Temp 98.7 F (37.1 C) (Oral)   Resp 16   Ht 5\' 5"  (1.651 m)   Wt 185 lb (83.9 kg)   SpO2 99%   BMI 30.79 kg/m  No data found.   Physical Exam  Constitutional: She is oriented to person, place, and time. She appears well-nourished.  Eyes: Conjunctivae and EOM are normal. Pupils are equal, round, and reactive to light.  Neck: Normal range of motion. Neck supple.  Cardiovascular: Normal rate, regular rhythm, normal heart sounds and intact distal pulses.  Exam reveals no gallop and no friction rub.   No murmur heard. Pulmonary/Chest: Effort normal and breath sounds normal.  Abdominal: Soft. Bowel sounds are normal.  Musculoskeletal: Normal range of motion.  Neurological: She is alert and oriented to person, place, and time.  Skin: Skin is warm and dry.    Urgent Care Course     Procedures (including critical care time)  Labs Review Labs Reviewed - No data to display  Imaging Review No results found.    MDM   1. Palpitations   2. Malignant neoplasm of female breast, unspecified estrogen receptor status, unspecified laterality, unspecified site of breast ([place])      Patient reports palpitations earlier this afternoon while taking a nap. She did admit to drinking one cup of coffee this morning as well as having a diet Coke with lunch. She does have a history of irregular heartbeat in the past which she has been seen by cardiology. Also patient recently started a hormonal treatment for her breast cancer. Upon evaluation patient with a regular heartbeat of 80 and no report of palpitations. Advised patient to contact her cardiologist tomorrow for an  appointment and possible Holter monitoring. Also recommended the patient that she contact her oncologist treated for her symptoms with this medication. Patient verbalized understanding and agreement with the plan. Patient advised should her symptoms return visit to the ER for further evaluation.  Luvenia Redden, PA-C     Luvenia Redden, PA-C 11/21/16 [phone]

## 2016-11-21 NOTE — ED Triage Notes (Signed)
Patient C/O of fatigue with rapid heart rate and leg soreness.

## 2016-12-21 ENCOUNTER — Encounter: Payer: Self-pay | Admitting: Internal Medicine

## 2016-12-21 ENCOUNTER — Ambulatory Visit (INDEPENDENT_AMBULATORY_CARE_PROVIDER_SITE_OTHER): Payer: BC Managed Care – PPO | Admitting: Internal Medicine

## 2016-12-21 VITALS — BP 94/66 | HR 53 | Temp 98.0°F | Ht 65.0 in | Wt 189.2 lb

## 2016-12-21 DIAGNOSIS — R197 Diarrhea, unspecified: Secondary | ICD-10-CM | POA: Diagnosis not present

## 2016-12-21 DIAGNOSIS — J019 Acute sinusitis, unspecified: Secondary | ICD-10-CM

## 2016-12-21 DIAGNOSIS — Z170 Estrogen receptor positive status [ER+]: Secondary | ICD-10-CM | POA: Diagnosis not present

## 2016-12-21 DIAGNOSIS — E785 Hyperlipidemia, unspecified: Secondary | ICD-10-CM

## 2016-12-21 DIAGNOSIS — C50412 Malignant neoplasm of upper-outer quadrant of left female breast: Secondary | ICD-10-CM | POA: Diagnosis not present

## 2016-12-21 MED ORDER — AZITHROMYCIN 250 MG PO TABS: ORAL_TABLET | ORAL | 0 refills | Status: DC

## 2016-12-21 NOTE — Progress Notes (Signed)
Date:  12/21/2016   Name:  Holly Villegas   DOB:  29-Jan-1961   MRN:  [id number]   Chief Complaint: Sore Throat (Started 5 days ago. Sunday felt completely worse. Swollen tonsils, ears are clogged, and a lot of mucous. Taking dayquil and nyquil. Fatigued. Cough with yellow production in the morning. Clear production throughout day. ) Sore Throat   This is a new problem. The current episode started in the past 7 days. The problem has been gradually worsening. There has been no fever. Associated symptoms include congestion and shortness of breath. Pertinent negatives include no abdominal pain or headaches.   Breast cancer treatment -she is undergoing treatment for lymphedema from her breast surgery. She's tried several antiestrogen medications with significant side effects. Most recently severe muscle pains and diarrhea. The symptoms have mostly resolved since stopping medication. She still needs to follow-up with oncology to see what the next treatment plan is.  HTN - well controlled on current medications. She has not had labs in 6 months will like to have this rechecked today.  Lipids - she continues on statin therapy without side effects or problems. She's due for a lipid panel.   Review of Systems  Constitutional: Positive for fatigue. Negative for chills and fever.  HENT: Positive for congestion, postnasal drip, sinus pain and sinus pressure.   Eyes: Negative for visual disturbance.  Respiratory: Positive for shortness of breath and wheezing. Negative for chest tightness.   Cardiovascular: Negative for chest pain, palpitations and leg swelling.  Gastrointestinal: Negative for abdominal pain.  Endocrine: Negative for polydipsia and polyuria.  Musculoskeletal: Positive for arthralgias.  Skin: Negative for rash.  Allergic/Immunologic: Negative for environmental allergies.  Neurological: Negative for dizziness, syncope and headaches.    Patient Active Problem List   Diagnosis  Date Noted  . Lumbar degenerative disc disease 06/16/2016  . Irregular heart rhythm 06/11/2016  . Bilateral sciatica 06/11/2016  . Malignant neoplasm of upper-outer quadrant of left breast in female, estrogen receptor positive ([place]) 03/26/2016  . Abnormal mammogram of left breast 03/17/2016  . Muscle strain of right upper arm 12/17/2015  . Aortic heart valve narrowing 09/15/2015  . Paroxysmal supraventricular tachycardia ([place]) 08/07/2015  . Heart palpitations 07/07/2015  . Awareness of heartbeats 07/07/2015  . Left wrist tendonitis 06/09/2015  . Plantar fasciitis, bilateral 11/29/2014  . Vitamin D deficiency 11/29/2014  . Dupuytren's contracture of foot 11/29/2014  . Dyslipidemia 11/28/2014  . Environmental and seasonal allergies 11/28/2014  . Raynaud's syndrome without gangrene 11/28/2014  . Carpal tunnel syndrome 11/28/2014  . Benign colonic polyp 11/28/2014  . Diverticulosis of colon without diverticulitis 11/28/2014  . Essential hypertension 11/28/2014  . Impaired renal function 11/28/2014  . Asymptomatic varicose veins 11/28/2014    Prior to Admission medications   Medication Sig Start Date End Date Taking? Authorizing Provider  acetaminophen (TYLENOL) 500 MG tablet Take 1 tablet by mouth once as needed.   Yes [provider]  aspirin 81 MG chewable tablet Chew 81 mg by mouth daily.   Yes [provider]  atorvastatin (LIPITOR) 20 MG tablet TAKE ONE TABLET AT BEDTIME. 06/24/16  Yes Glean Hess, MD  azelastine (ASTELIN) 0.1 % nasal spray USE ONE SPRAY IN EACH NOSTRIL ONCE DAILYAS DIRECTED BY PHYSICIAN. 02/03/16  Yes Glean Hess, MD  cetirizine (ZYRTEC) 10 MG tablet TAKE (1) TABLET BY MOUTH EVERY DAY 06/17/15  Yes Glean Hess, MD  esomeprazole (NEXIUM) 40 MG capsule Take 1 tablet by mouth daily.  06/27/13  Yes [provider]  fluticasone (FLONASE) 50 MCG/ACT nasal spray Place 2 sprays into both nostrils daily. 07/01/15  Yes Frederich Cha, MD    lisinopril (PRINIVIL,ZESTRIL) 10 MG tablet  11/29/15  Yes [provider]  Multiple Vitamin (MULTI-VITAMINS) TABS Take 1 tablet by mouth daily.   Yes [provider]  Vitamin D, Ergocalciferol, (DRISDOL) 50000 units CAPS capsule Take 50,000 Units by mouth every 7 (seven) days.   Yes [provider]    Allergies  Allergen Reactions  . Codeine Sulfate Nausea And Vomiting  . Shellfish-Derived Products   . Sulfa Antibiotics Nausea And Vomiting  . Brassica Oleracea Italica Nausea And Vomiting and Other (See Comments)    Does not digest and vomits up    Past Surgical History:  Procedure Laterality Date  . BREAST BIOPSY Left 02/24/2016   path pending  . CHOLECYSTECTOMY  2012  . COLONOSCOPY WITH PROPOFOL N/A 10/13/2015   Procedure: COLONOSCOPY WITH PROPOFOL;  Surgeon: Hulen Luster, MD;  Location: [facility] ENDOSCOPY;  Service: Gastroenterology;  Laterality: N/A;  . ESOPHAGEAL DILATION  07/2009  . THROAT SURGERY  2011   polyp removed from vocal cord  . TUBAL LIGATION  1991    Social History  Substance Use Topics  . Smoking status: Never Smoker  . Smokeless tobacco: Never Used  . Alcohol use 0.0 oz/week     Comment: social     Medication list has been reviewed and updated.   Physical Exam  Constitutional: She is oriented to person, place, and time. She appears well-developed. No distress.  HENT:  Head: Normocephalic and atraumatic.  Right Ear: Tympanic membrane and ear canal normal.  Left Ear: Tympanic membrane and ear canal normal.  Nose: Right sinus exhibits frontal sinus tenderness. Left sinus exhibits frontal sinus tenderness.  Mouth/Throat: Posterior oropharyngeal erythema present. No oropharyngeal exudate or posterior oropharyngeal edema.  Neck: Normal range of motion. Neck supple.  Cardiovascular: Normal rate, regular rhythm and normal heart sounds.   Pulmonary/Chest: Effort normal and breath sounds normal. No respiratory distress. She has no wheezes.   Musculoskeletal: Normal range of motion. She exhibits no edema or tenderness.  Neurological: She is alert and oriented to person, place, and time.  Skin: Skin is warm and dry. No rash noted.  Psychiatric: She has a normal mood and affect. Her speech is normal and behavior is normal. Thought content normal.  Nursing note and vitals reviewed.   BP 94/66   Pulse (!) 53   Temp 98 F (36.7 C)   Ht 5\' 5"  (1.651 m)   Wt 189 lb 3.2 oz (85.8 kg)   SpO2 99%   BMI 31.48 kg/m   Assessment and Plan: 1. Acute non-recurrent sinusitis, unspecified location Continue Robitussin - azithromycin (ZITHROMAX Z-PAK) 250 MG tablet; UAD  Dispense: 6 each; Refill: 0 - CBC with Differential/Platelet  2. Diarrhea, unspecified type Check labs but suspect this is from medication - Comprehensive metabolic panel - TSH  3. Dyslipidemia Continue statin therapy - Lipid panel  4. Malignant neoplasm of upper-outer quadrant of left breast in female, estrogen receptor positive [facility]) Consult Oncology regarding additional therapy  Meds ordered this encounter  Medications  . azithromycin (ZITHROMAX Z-PAK) 250 MG tablet    Sig: UAD    Dispense:  6 each    Refill:  0    Halina Maidens, MD [facility]  12/21/2016

## 2016-12-22 LAB — CBC WITH DIFFERENTIAL/PLATELET
BASOS ABS: 0 10*3/uL (ref 0.0–0.2)
Basos: 1 %
EOS (ABSOLUTE): 0.2 10*3/uL (ref 0.0–0.4)
Eos: 3 %
Hematocrit: 38.1 % (ref 34.0–46.6)
Hemoglobin: 13.3 g/dL (ref 11.1–15.9)
IMMATURE GRANULOCYTES: 0 %
Immature Grans (Abs): 0 10*3/uL (ref 0.0–0.1)
LYMPHS: 37 %
Lymphocytes Absolute: 2.1 10*3/uL (ref 0.7–3.1)
MCH: 29.8 pg (ref 26.6–33.0)
MCHC: 34.9 g/dL (ref 31.5–35.7)
MCV: 85 fL (ref 79–97)
MONOS ABS: 0.8 10*3/uL (ref 0.1–0.9)
Monocytes: 15 %
NEUTROS PCT: 44 %
Neutrophils Absolute: 2.6 10*3/uL (ref 1.4–7.0)
PLATELETS: 235 10*3/uL (ref 150–379)
RBC: 4.46 x10E6/uL (ref 3.77–5.28)
RDW: 14.1 % (ref 12.3–15.4)
WBC: 5.6 10*3/uL (ref 3.4–10.8)

## 2016-12-22 LAB — COMPREHENSIVE METABOLIC PANEL
ALK PHOS: 111 IU/L (ref 39–117)
ALT: 26 IU/L (ref 0–32)
AST: 27 IU/L (ref 0–40)
Albumin/Globulin Ratio: 1.6 (ref 1.2–2.2)
Albumin: 4.4 g/dL (ref 3.5–5.5)
BUN/Creatinine Ratio: 11 (ref 9–23)
BUN: 9 mg/dL (ref 6–24)
Bilirubin Total: 0.3 mg/dL (ref 0.0–1.2)
CO2: 27 mmol/L (ref 20–29)
Calcium: 9.7 mg/dL (ref 8.7–10.2)
Chloride: 92 mmol/L — ABNORMAL LOW (ref 96–106)
Creatinine, Ser: 0.8 mg/dL (ref 0.57–1.00)
GFR calc Af Amer: 94 mL/min/{1.73_m2} (ref >59)
GFR calc non Af Amer: 82 mL/min/{1.73_m2} (ref >59)
GLOBULIN, TOTAL: 2.8 g/dL (ref 1.5–4.5)
Glucose: 95 mg/dL (ref 65–99)
POTASSIUM: 4.8 mmol/L (ref 3.5–5.2)
Sodium: 134 mmol/L (ref 134–144)
Total Protein: 7.2 g/dL (ref 6.0–8.5)

## 2016-12-22 LAB — LIPID PANEL
CHOL/HDL RATIO: 2.7 ratio (ref 0.0–4.4)
CHOLESTEROL TOTAL: 175 mg/dL (ref 100–199)
HDL: 66 mg/dL (ref >39)
LDL Calculated: 86 mg/dL (ref 0–99)
TRIGLYCERIDES: 114 mg/dL (ref 0–149)
VLDL Cholesterol Cal: 23 mg/dL (ref 5–40)

## 2016-12-22 LAB — TSH: TSH: 1.59 u[IU]/mL (ref 0.450–4.500)

## 2016-12-23 ENCOUNTER — Ambulatory Visit: Payer: BC Managed Care – PPO | Admitting: Internal Medicine

## 2016-12-28 ENCOUNTER — Other Ambulatory Visit: Payer: Self-pay | Admitting: Internal Medicine

## 2017-01-06 ENCOUNTER — Telehealth: Payer: Self-pay

## 2017-01-06 NOTE — Telephone Encounter (Signed)
Pt called stating she would like CRP test done to test for how much inflammation is in the body to get a baseline for this before oncology puts her back on medications. Dr. Army Melia said this does not indicate a reason to take this test. If she wants this done she will need to ask the oncologist to order it. She verbalized understanding.

## 2017-01-21 ENCOUNTER — Other Ambulatory Visit: Payer: Self-pay | Admitting: Internal Medicine

## 2017-02-03 DIAGNOSIS — I890 Lymphedema, not elsewhere classified: Secondary | ICD-10-CM | POA: Insufficient documentation

## 2017-02-17 ENCOUNTER — Emergency Department: Payer: Worker's Compensation

## 2017-02-17 ENCOUNTER — Emergency Department
Admission: EM | Admit: 2017-02-17 | Discharge: 2017-02-17 | Disposition: A | Discharge status: Home / Self Care | Payer: Worker's Compensation | Attending: Emergency Medicine | Admitting: Emergency Medicine

## 2017-02-17 ENCOUNTER — Encounter: Payer: Self-pay | Admitting: Emergency Medicine

## 2017-02-17 DIAGNOSIS — S0003XA Contusion of scalp, initial encounter: Secondary | ICD-10-CM | POA: Diagnosis not present

## 2017-02-17 DIAGNOSIS — Z79899 Other long term (current) drug therapy: Secondary | ICD-10-CM | POA: Insufficient documentation

## 2017-02-17 DIAGNOSIS — Y939 Activity, unspecified: Secondary | ICD-10-CM | POA: Insufficient documentation

## 2017-02-17 DIAGNOSIS — Y990 Civilian activity done for income or pay: Secondary | ICD-10-CM | POA: Insufficient documentation

## 2017-02-17 DIAGNOSIS — W07XXXA Fall from chair, initial encounter: Secondary | ICD-10-CM | POA: Diagnosis not present

## 2017-02-17 DIAGNOSIS — I10 Essential (primary) hypertension: Secondary | ICD-10-CM | POA: Insufficient documentation

## 2017-02-17 DIAGNOSIS — S300XXA Contusion of lower back and pelvis, initial encounter: Secondary | ICD-10-CM | POA: Diagnosis not present

## 2017-02-17 DIAGNOSIS — Y929 Unspecified place or not applicable: Secondary | ICD-10-CM | POA: Insufficient documentation

## 2017-02-17 DIAGNOSIS — S0990XA Unspecified injury of head, initial encounter: Secondary | ICD-10-CM | POA: Diagnosis present

## 2017-02-17 DIAGNOSIS — W19XXXA Unspecified fall, initial encounter: Secondary | ICD-10-CM

## 2017-02-17 MED ORDER — MELOXICAM 15 MG PO TABS: 15.0000 mg | ORAL_TABLET | Freq: Every day | ORAL | 0 refills | Status: DC

## 2017-02-17 MED ORDER — METHOCARBAMOL 500 MG PO TABS: 500.0000 mg | ORAL_TABLET | Freq: Four times a day (QID) | ORAL | 0 refills | Status: DC

## 2017-02-17 MED ORDER — KETOROLAC TROMETHAMINE 30 MG/ML IJ SOLN
30.0000 mg | Freq: Once | INTRAMUSCULAR | Status: AC
  Administered 2017-02-17: 30 mg via INTRAMUSCULAR
  Filled 2017-02-17: qty 1

## 2017-02-17 MED ORDER — ORPHENADRINE CITRATE 30 MG/ML IJ SOLN
60.0000 mg | Freq: Once | INTRAMUSCULAR | Status: AC
  Administered 2017-02-17: 60 mg via INTRAMUSCULAR
  Filled 2017-02-17: qty 2

## 2017-02-17 NOTE — ED Triage Notes (Signed)
Pt comes into the ED via POv c/o a fall at school.  Patient fell off of a rolling stool and hit her head on the ground.  Denies LOC, N/V, or dizziness.  States she had a "bump" on the back of her head but she put ice on it and it has decreased in size.  Patient is alert and oriented x4 and neurologically intact at this time.

## 2017-02-17 NOTE — ED Provider Notes (Signed)
[facility] Emergency Department Provider Note  ____________________________________________  Time seen: Approximately 6:02 PM  I have reviewed the triage vital signs and the nursing notes.   HISTORY  Chief Complaint Fall    HPI Holly Villegas is a 56 y.o. female who presents to emergency department complaining of headache, neck pain, lower back/tailbone pain status post a fall at work today. Per the patient, she was at work and she went to sit on a rolling stool. Patient reports that the stool "flipped" causing her to fall landing on her tailbone and striking her head against a concrete wall. She reports she saw stars but did not lose consciousness. No subsequent loss consciousness. Patient has a mild headache but denies any visual changes. She is endorsing right-sided neck pain and lower back pain. No Medications prior to arrival.No other injury or complaint.   Past Medical History:  Diagnosis Date  . Diverticulosis   . GERD (gastroesophageal reflux disease)   . Hypercholesteremia   . Hypertension     Patient Active Problem List   Diagnosis Date Noted  . Lumbar degenerative disc disease 06/16/2016  . Irregular heart rhythm 06/11/2016  . Bilateral sciatica 06/11/2016  . Malignant neoplasm of upper-outer quadrant of left breast in female, estrogen receptor positive ([place]) 03/26/2016  . Abnormal mammogram of left breast 03/17/2016  . Muscle strain of right upper arm 12/17/2015  . Aortic heart valve narrowing 09/15/2015  . Paroxysmal supraventricular tachycardia ([place]) 08/07/2015  . Heart palpitations 07/07/2015  . Awareness of heartbeats 07/07/2015  . Left wrist tendonitis 06/09/2015  . Plantar fasciitis, bilateral 11/29/2014  . Vitamin D deficiency 11/29/2014  . Dupuytren's contracture of foot 11/29/2014  . Dyslipidemia 11/28/2014  . Environmental and seasonal allergies 11/28/2014  . Raynaud's syndrome without gangrene 11/28/2014  . Carpal  tunnel syndrome 11/28/2014  . Benign colonic polyp 11/28/2014  . Diverticulosis of colon without diverticulitis 11/28/2014  . Essential hypertension 11/28/2014  . Impaired renal function 11/28/2014  . Asymptomatic varicose veins 11/28/2014    Past Surgical History:  Procedure Laterality Date  . BREAST BIOPSY Left 02/24/2016   path pending  . CHOLECYSTECTOMY  2012  . COLONOSCOPY WITH PROPOFOL N/A 10/13/2015   Procedure: COLONOSCOPY WITH PROPOFOL;  Surgeon: Hulen Luster, MD;  Location: [facility] ENDOSCOPY;  Service: Gastroenterology;  Laterality: N/A;  . ESOPHAGEAL DILATION  07/2009  . THROAT SURGERY  2011   polyp removed from vocal cord  . TUBAL LIGATION  1991    Prior to Admission medications   Medication Sig Start Date End Date Taking? Authorizing Provider  acetaminophen (TYLENOL) 500 MG tablet Take 1 tablet by mouth once as needed.    [provider]  aspirin 81 MG chewable tablet Chew 81 mg by mouth daily.    [provider]  atorvastatin (LIPITOR) 20 MG tablet TAKE ONE TABLET AT BEDTIME. 12/29/16   Glean Hess, MD  azelastine (ASTELIN) 0.1 % nasal spray USE ONE SPRAY IN EACH NOSTRIL ONCE DAILYAS DIRECTED BY PHYSICIAN. 02/03/16   Glean Hess, MD  azithromycin (ZITHROMAX Z-PAK) 250 MG tablet UAD 12/21/16   Glean Hess, MD  cetirizine (ZYRTEC) 10 MG tablet TAKE (1) TABLET BY MOUTH EVERY DAY 06/17/15   Glean Hess, MD  esomeprazole (NEXIUM) 40 MG capsule Take 1 tablet by mouth daily. 06/27/13   [provider]  fluticasone (FLONASE) 50 MCG/ACT nasal spray Place 2 sprays into both nostrils daily. 07/01/15   Frederich Cha, MD  lisinopril (PRINIVIL,ZESTRIL) 10  MG tablet  11/29/15   [provider]  meloxicam (MOBIC) 15 MG tablet Take 1 tablet (15 mg total) by mouth daily. 02/17/17   Rito Lecomte, Charline Bills, PA-C  methocarbamol (ROBAXIN) 500 MG tablet Take 1 tablet (500 mg total) by mouth 4 (four) times daily. 02/17/17   Kadey Mihalic, Charline Bills, PA-C   Multiple Vitamin (MULTI-VITAMINS) TABS Take 1 tablet by mouth daily.    [provider]  Vitamin D, Ergocalciferol, (DRISDOL) 50000 units CAPS capsule Take 50,000 Units by mouth every 7 (seven) days.    [provider]    Allergies Codeine sulfate; Shellfish-derived products; Sulfa antibiotics; and Brassica oleracea italica  Family History  Problem Relation Age of Onset  . Hypertension Mother   . Migraines Mother   . Peptic Ulcer Disease Father   . Breast cancer Maternal Aunt 17    Social History Social History  Substance Use Topics  . Smoking status: Never Smoker  . Smokeless tobacco: Never Used  . Alcohol use 0.0 oz/week     Comment: social     Review of Systems  Constitutional: No fever/chills Eyes: No visual changes.  Cardiovascular: no chest pain. Respiratory: no cough. No SOB. Gastrointestinal: No abdominal pain.  No nausea, no vomiting.  Musculoskeletal: Positive for neck and lower back pain Skin: Negative for rash, abrasions, lacerations, ecchymosis. Neurological: Positive for headache but denies focal weakness or numbness. 10-point ROS otherwise negative.  ____________________________________________   PHYSICAL EXAM:  VITAL SIGNS: ED Triage Vitals  Enc Vitals Group     BP 02/17/17 1738 130/78     Pulse Rate 02/17/17 1738 60     Resp 02/17/17 1738 16     Temp 02/17/17 1738 97.8 F (36.6 C)     Temp Source 02/17/17 1738 Oral     SpO2 02/17/17 1738 100 %     Weight 02/17/17 1738 185 lb (83.9 kg)     Height 02/17/17 1738 5\' 5"  (1.651 m)     Head Circumference --      Peak Flow --      Pain Score 02/17/17 1737 5     Pain Loc --      Pain Edu? --      Excl. in [place]? --      Constitutional: Alert and oriented. Well appearing and in no acute distress. Eyes: Conjunctivae are normal. PERRL. EOMI. Head: Small traumatic hematoma noted to the right occipital skull. Area is mildly tender to palpation. No underlying palpable abnormality. No  raccoon eyes. No battle signs. No serosanguineous fluid drainage from the ears or nares. ENT:      Ears:       Nose: No congestion/rhinnorhea.      Mouth/Throat: Mucous membranes are moist.  Neck: No stridor.  No midline cervical spine tenderness to palpation.  Diffuse tenderness to palpation along right side paraspinal muscles as well as trapezius muscle. Radial pulse intact bilateral upper extremities. Sensation intact and equal bilateral upper extremity's. Cardiovascular: Normal rate, regular rhythm. Normal S1 and S2.  Good peripheral circulation. Respiratory: Normal respiratory effort without tachypnea or retractions. Lungs CTAB. Good air entry to the bases with no decreased or absent breath sounds. Musculoskeletal: Full range of motion to all extremities. No gross deformities appreciated. No deformities noted to lower spine and sacrum on inspection. Patient is diffusely tender to palpation through the lumbar and sacrum. No specific point tenderness. No palpable abnormality. No step-off. Dorsalis pedis pulse intact bilateral lower extremities. Sensation intact and equal bilateral lower  extremities. Neurologic:  Normal speech and language. No gross focal neurologic deficits are appreciated. Cranial nerves II through XII grossly intact. Negative Romberg's. Negative pronator drift. Skin:  Skin is warm, dry and intact. No rash noted. Psychiatric: Mood and affect are normal. Speech and behavior are normal. Patient exhibits appropriate insight and judgement.   ____________________________________________   LABS (all labs ordered are listed, but only abnormal results are displayed)  Labs Reviewed - No data to display ____________________________________________  EKG   ____________________________________________  RADIOLOGY [facility] Azalynn Maxim, personally viewed and evaluated these images as part of my medical decision making, as well as reviewing the written report by the  radiologist.  Dg Lumbar Spine Complete  Result Date: 02/17/2017 CLINICAL DATA:  Sacral and coccygeal pain after fall from stools today. EXAM: LUMBAR SPINE - COMPLETE 4+ VIEW COMPARISON:  06/11/2016 lumbar spine radiographs FINDINGS: Lumbosacral transitional vertebra with lumbarized S1. Chronic degenerative disc space narrowing, mild to moderate L2-3 and L4-5 and moderate-to-marked at L5-S1 with discogenic sclerosis. Associated facet sclerosis and joint space narrowing at L5-S1. Mild chronic physiologic anterior wedging of T11 and T12 associated with degenerative disc disease. No new fractures are noted. IMPRESSION: 1. 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 2. Lumbar spondylosis with disc space flattening L2-3, L4-5 and L5-S1. 3. Facet arthropathy L5-S1. 4. Chronic mild anterior wedging of T11 and T12 with degenerative disc disease. 5. No acute fracture is identified. Electronically Signed   By: Ashley Royalty M.D.   On: 02/17/2017 19:07   Dg Sacrum/coccyx  Result Date: 02/17/2017 CLINICAL DATA:  Coccyx pain after fall from stool EXAM: SACRUM AND COCCYX - 2+ VIEW COMPARISON:  None. FINDINGS: The appearance of the sacrum and coccyx appear stable. No displacement of the pre sacral soft tissues or fat. Transitional lumbosacral vertebra with lumbarized S1 and degenerative disc disease with facet arthropathy at L5-S1. IMPRESSION: No apparent fracture is noted of the sacrum and coccyx radiographically. Electronically Signed   By: Ashley Royalty M.D.   On: 02/17/2017 19:12   Ct Head Wo Contrast  Result Date: 02/17/2017 CLINICAL DATA:  56 year old who fell off of April lung stool earlier today and struck the back of her head. Initial encounter. EXAM: CT HEAD WITHOUT CONTRAST CT CERVICAL SPINE WITHOUT CONTRAST TECHNIQUE: Multidetector CT imaging of the head and cervical spine was performed following the standard protocol without intravenous contrast. Multiplanar CT image reconstructions of the cervical  spine were also generated. COMPARISON:  MRI brain 09/08/2010.  No prior cervical spine CT. FINDINGS: CT HEAD FINDINGS Brain: Ventricular system normal in size and appearance for age. No mass lesion. No midline shift. No acute hemorrhage or hematoma. No extra-axial fluid collections. No evidence of acute infarction. No focal brain parenchymal abnormalities. Vascular:  Minimal right carotid siphon atherosclerosis. Skull: No skull fracture or other focal osseous abnormality involving the skull. Sinuses/Orbits: Visualized paranasal sinuses, bilateral mastoid air cells and bilateral middle ear cavities well-aerated. Visualized orbits and globes are normal. Other: Small right posterior parietal scalp hematoma. CT CERVICAL SPINE FINDINGS Alignment: Anatomic. Skull base and vertebrae: No fractures identified involving the cervical spine. Coronal reformatted images demonstrate an intact craniocervical junction, intact dens and intact lateral masses throughout. Soft tissues and spinal canal: No evidence of paraspinous or spinal canal hematoma. No evidence of spinal stenosis. Disc levels: Moderate disc space narrowing and associated endplate hypertrophic changes at C3-4, C4-5 and C5-6. Facet joints intact throughout. Mild right and moderate left foraminal stenosis at C3-4. Mild left foraminal stenosis at  C4-5. Mild right and moderate left foraminal stenosis at C5-6. Upper chest: Visualized lung apices clear. Visualized superior mediastinum normal. Other: None. IMPRESSION: 1. Right posterior parietal scalp hematoma without underlying skull fracture. Otherwise normal unenhanced CT of the head. 2. No cervical spine fractures identified. 3. Degenerative disc disease and spondylosis at C3-4, C4-5 and C5-6 with foraminal stenoses at these levels as detailed above. Electronically Signed   By: Evangeline Dakin M.D.   On: 02/17/2017 18:45   Ct Cervical Spine Wo Contrast  Result Date: 02/17/2017 CLINICAL DATA:  56 year old who fell  off of April lung stool earlier today and struck the back of her head. Initial encounter. EXAM: CT HEAD WITHOUT CONTRAST CT CERVICAL SPINE WITHOUT CONTRAST TECHNIQUE: Multidetector CT imaging of the head and cervical spine was performed following the standard protocol without intravenous contrast. Multiplanar CT image reconstructions of the cervical spine were also generated. COMPARISON:  MRI brain 09/08/2010.  No prior cervical spine CT. FINDINGS: CT HEAD FINDINGS Brain: Ventricular system normal in size and appearance for age. No mass lesion. No midline shift. No acute hemorrhage or hematoma. No extra-axial fluid collections. No evidence of acute infarction. No focal brain parenchymal abnormalities. Vascular:  Minimal right carotid siphon atherosclerosis. Skull: No skull fracture or other focal osseous abnormality involving the skull. Sinuses/Orbits: Visualized paranasal sinuses, bilateral mastoid air cells and bilateral middle ear cavities well-aerated. Visualized orbits and globes are normal. Other: Small right posterior parietal scalp hematoma. CT CERVICAL SPINE FINDINGS Alignment: Anatomic. Skull base and vertebrae: No fractures identified involving the cervical spine. Coronal reformatted images demonstrate an intact craniocervical junction, intact dens and intact lateral masses throughout. Soft tissues and spinal canal: No evidence of paraspinous or spinal canal hematoma. No evidence of spinal stenosis. Disc levels: Moderate disc space narrowing and associated endplate hypertrophic changes at C3-4, C4-5 and C5-6. Facet joints intact throughout. Mild right and moderate left foraminal stenosis at C3-4. Mild left foraminal stenosis at C4-5. Mild right and moderate left foraminal stenosis at C5-6. Upper chest: Visualized lung apices clear. Visualized superior mediastinum normal. Other: None. IMPRESSION: 1. Right posterior parietal scalp hematoma without underlying skull fracture. Otherwise normal unenhanced CT of  the head. 2. No cervical spine fractures identified. 3. Degenerative disc disease and spondylosis at C3-4, C4-5 and C5-6 with foraminal stenoses at these levels as detailed above. Electronically Signed   By: Evangeline Dakin M.D.   On: 02/17/2017 18:45    ____________________________________________    PROCEDURES  Procedure(s) performed:    Procedures    Medications  ketorolac (TORADOL) 30 MG/ML injection 30 mg (not administered)  orphenadrine (NORFLEX) injection 60 mg (not administered)     ____________________________________________   INITIAL IMPRESSION / ASSESSMENT AND PLAN / ED COURSE  Pertinent labs & imaging results that were available during my care of the patient were reviewed by me and considered in my medical decision making (see chart for details).  Review of the [place] CSRS was performed in accordance of the [place] prior to dispensing any controlled drugs.     Patient's diagnosis is consistent with Fall resulting in occipital skull contusion and sacral contusion. CT scan and x-rays returned without any acute abnormality. Exam is reassuring. No indication of concussion. Patient is given Toradol and muscle relaxer injection in the emergency department.. Patient will be discharged home with prescriptions for Mobic and Robaxin. Patient is to follow up with primary care as needed or otherwise directed. Patient is given ED precautions to return to the ED for any worsening  or new symptoms.     ____________________________________________  FINAL CLINICAL IMPRESSION(S) / ED DIAGNOSES  Final diagnoses:  Hematoma of scalp, initial encounter  Contusion of sacrum, initial encounter  Fall, initial encounter      NEW MEDICATIONS STARTED DURING THIS VISIT:  New Prescriptions   MELOXICAM (MOBIC) 15 MG TABLET    Take 1 tablet (15 mg total) by mouth daily.   METHOCARBAMOL (ROBAXIN) 500 MG TABLET    Take 1 tablet (500 mg total) by mouth 4 (four) times daily.         This chart was dictated using voice recognition software/Dragon. Despite best efforts to proofread, errors can occur which can change the meaning. Any change was purely unintentional.    Darletta Moll, PA-C 02/17/17 1930    Eula Listen, MD 02/17/17 [id number]

## 2017-02-17 NOTE — ED Notes (Signed)

## 2017-02-17 NOTE — ED Notes (Signed)
See triage note  States she fell from rolling stool at work. States she landed on tailbone and hit head on wall   No loc  Small hematoma noted to back of head

## 2017-03-05 ENCOUNTER — Other Ambulatory Visit: Payer: Self-pay | Admitting: Internal Medicine

## 2017-03-09 ENCOUNTER — Ambulatory Visit
Admission: EM | Admit: 2017-03-09 | Discharge: 2017-03-09 | Disposition: A | Discharge status: Home / Self Care | Payer: BC Managed Care – PPO | Attending: Family Medicine | Admitting: Family Medicine

## 2017-03-09 DIAGNOSIS — Z9049 Acquired absence of other specified parts of digestive tract: Secondary | ICD-10-CM | POA: Insufficient documentation

## 2017-03-09 DIAGNOSIS — R001 Bradycardia, unspecified: Secondary | ICD-10-CM | POA: Insufficient documentation

## 2017-03-09 DIAGNOSIS — Z79899 Other long term (current) drug therapy: Secondary | ICD-10-CM | POA: Diagnosis not present

## 2017-03-09 DIAGNOSIS — R42 Dizziness and giddiness: Secondary | ICD-10-CM

## 2017-03-09 DIAGNOSIS — Z882 Allergy status to sulfonamides status: Secondary | ICD-10-CM | POA: Insufficient documentation

## 2017-03-09 DIAGNOSIS — Z8379 Family history of other diseases of the digestive system: Secondary | ICD-10-CM | POA: Diagnosis not present

## 2017-03-09 DIAGNOSIS — Z853 Personal history of malignant neoplasm of breast: Secondary | ICD-10-CM | POA: Diagnosis not present

## 2017-03-09 DIAGNOSIS — Z8249 Family history of ischemic heart disease and other diseases of the circulatory system: Secondary | ICD-10-CM | POA: Diagnosis not present

## 2017-03-09 DIAGNOSIS — E785 Hyperlipidemia, unspecified: Secondary | ICD-10-CM | POA: Diagnosis not present

## 2017-03-09 DIAGNOSIS — Z7951 Long term (current) use of inhaled steroids: Secondary | ICD-10-CM | POA: Insufficient documentation

## 2017-03-09 DIAGNOSIS — R002 Palpitations: Secondary | ICD-10-CM | POA: Diagnosis present

## 2017-03-09 DIAGNOSIS — I10 Essential (primary) hypertension: Secondary | ICD-10-CM | POA: Diagnosis not present

## 2017-03-09 DIAGNOSIS — R0602 Shortness of breath: Secondary | ICD-10-CM | POA: Diagnosis not present

## 2017-03-09 DIAGNOSIS — E559 Vitamin D deficiency, unspecified: Secondary | ICD-10-CM | POA: Insufficient documentation

## 2017-03-09 DIAGNOSIS — K219 Gastro-esophageal reflux disease without esophagitis: Secondary | ICD-10-CM | POA: Diagnosis not present

## 2017-03-09 DIAGNOSIS — Z885 Allergy status to narcotic agent status: Secondary | ICD-10-CM | POA: Insufficient documentation

## 2017-03-09 NOTE — Discharge Instructions (Signed)
If symptoms returns with becomes worse please go to the ED of your choice for further evaluation

## 2017-03-09 NOTE — ED Triage Notes (Signed)
Patient complains of palpitations that started last night that have lasted into today. Patient states that her heartbeat was in the 40s on her fitbit last night and this morning. Patient states that while she was work today she noticed palpitations and states that she has not felt right. Patient denies chest pain but does report neck pain and numbness in her hands.

## 2017-03-09 NOTE — ED Provider Notes (Addendum)
[facility]    CSN: [id number] Arrival date & time: 03/09/17  1457     History   Chief Complaint Chief Complaint  Patient presents with  . Palpitations    HPI Holly Villegas is a 56 y.o. female.   Patient's a 56 year old white female with a history of tachycardia about 4 to 5 years ago. She reports shortness of breath feeling lightheaded and dizzy last night. That's when the palpitations. Occurred. When she checked her pulse showing confined 40 pulses per minute on checking. Since then pulse rate has fluctuated Bactrim for emesis times she's had a low pulse rate in the day while she was working. Because the symptoms continued she came in to be seen and evaluated. About 4-5 years ago when she was evaluated she had echo stress test and other things done and was told she has some consultation on the vessels but nothing that they were going to treat at this time. She has his diverticulitis and GERD hyperlipidemia and hypertension as well as lumbar disc disease. Had a history of breast cancer and lumpectomy. She does have a cholecystectomy and colonoscopy and esophageal dilatation as well as a tubal ligation.   The history is provided by the patient. No language interpreter was used.  Palpitations  Palpitations quality:  Slow Onset quality:  Sudden Timing:  Intermittent Progression:  Waxing and waning Chronicity:  New Context: not caffeine, not dehydration, not illicit drugs, not nicotine and not stimulant use   Relieved by:  Nothing Worsened by:  Stress Associated symptoms: no chest pain     Past Medical History:  Diagnosis Date  . Diverticulosis   . GERD (gastroesophageal reflux disease)   . Hypercholesteremia   . Hypertension     Patient Active Problem List   Diagnosis Date Noted  . Lumbar degenerative disc disease 06/16/2016  . Irregular heart rhythm 06/11/2016  . Bilateral sciatica 06/11/2016  . Malignant neoplasm of upper-outer quadrant of left  breast in female, estrogen receptor positive ([place]) 03/26/2016  . Abnormal mammogram of left breast 03/17/2016  . Muscle strain of right upper arm 12/17/2015  . Aortic heart valve narrowing 09/15/2015  . Paroxysmal supraventricular tachycardia ([place]) 08/07/2015  . Heart palpitations 07/07/2015  . Awareness of heartbeats 07/07/2015  . Left wrist tendonitis 06/09/2015  . Plantar fasciitis, bilateral 11/29/2014  . Vitamin D deficiency 11/29/2014  . Dupuytren's contracture of foot 11/29/2014  . Dyslipidemia 11/28/2014  . Environmental and seasonal allergies 11/28/2014  . Raynaud's syndrome without gangrene 11/28/2014  . Carpal tunnel syndrome 11/28/2014  . Benign colonic polyp 11/28/2014  . Diverticulosis of colon without diverticulitis 11/28/2014  . Essential hypertension 11/28/2014  . Impaired renal function 11/28/2014  . Asymptomatic varicose veins 11/28/2014    Past Surgical History:  Procedure Laterality Date  . BREAST BIOPSY Left 02/24/2016   path pending  . CHOLECYSTECTOMY  2012  . COLONOSCOPY WITH PROPOFOL N/A 10/13/2015   Procedure: COLONOSCOPY WITH PROPOFOL;  Surgeon: Hulen Luster, MD;  [facility]COPY;  Service: Gastroenterology;  Laterality: N/A;  . ESOPHAGEAL DILATION  07/2009  . THROAT SURGERY  2011   polyp removed from vocal cord  . TUBAL LIGATION  1991    OB History    No data available       Home Medications    Prior to Admission medications   Medication Sig Start Date End Date Taking? Authorizing Provider  acetaminophen (TYLENOL) 500 MG tablet Take 1 tablet by mouth once as needed.  Yes [provider]  atorvastatin (LIPITOR) 20 MG tablet TAKE ONE TABLET AT BEDTIME. 12/29/16  Yes Glean Hess, MD  azelastine (ASTELIN) 0.1 % nasal spray INSTILL 1 SPRAY INTO EACH NOSTRIL ONCE DAILY AS DIRECTED BY PHYSICIAN 03/05/17  Yes Glean Hess, MD  cetirizine (ZYRTEC) 10 MG tablet TAKE (1) TABLET BY MOUTH EVERY DAY 06/17/15  Yes Glean Hess,  MD  esomeprazole (NEXIUM) 40 MG capsule Take 1 tablet by mouth daily. 06/27/13  Yes [provider]  fluticasone (FLONASE) 50 MCG/ACT nasal spray Place 2 sprays into both nostrils daily. 07/01/15  Yes Frederich Cha, MD  lisinopril (PRINIVIL,ZESTRIL) 10 MG tablet  11/29/15  Yes [provider]  Multiple Vitamin (MULTI-VITAMINS) TABS Take 1 tablet by mouth daily.   Yes [provider]  Vitamin D, Ergocalciferol, (DRISDOL) 50000 units CAPS capsule Take 50,000 Units by mouth every 7 (seven) days.   Yes [provider]  aspirin 81 MG chewable tablet Chew 81 mg by mouth daily.    [provider]  azithromycin (ZITHROMAX Z-PAK) 250 MG tablet UAD 12/21/16   Glean Hess, MD  meloxicam (MOBIC) 15 MG tablet Take 1 tablet (15 mg total) by mouth daily. 02/17/17   Cuthriell, Charline Bills, PA-C  methocarbamol (ROBAXIN) 500 MG tablet Take 1 tablet (500 mg total) by mouth 4 (four) times daily. 02/17/17   Cuthriell, Charline Bills, PA-C    Family History Family History  Problem Relation Age of Onset  . Hypertension Mother   . Migraines Mother   . Peptic Ulcer Disease Father   . Breast cancer Maternal Aunt 25    Social History Social History  Substance Use Topics  . Smoking status: Never Smoker  . Smokeless tobacco: Never Used  . Alcohol use 0.0 oz/week     Comment: social     Allergies   Codeine sulfate; Shellfish-derived products; Sulfa antibiotics; and Brassica oleracea italica   Review of Systems Review of Systems  Respiratory: Negative for chest tightness.   Cardiovascular: Positive for palpitations. Negative for chest pain.  All other systems reviewed and are negative.    Physical Exam Triage Vital Signs ED Triage Vitals [03/09/17 1507]  Enc Vitals Group     BP 127/76     Pulse Rate 66     Resp 17     Temp 98.4 F (36.9 C)     Temp Source Oral     SpO2 100 %     Weight 185 lb (83.9 kg)     Height 5\' 5"  (1.651 m)     Head Circumference       Peak Flow      Pain Score 0     Pain Loc      Pain Edu?      Excl. in [place]?    No data found.   Updated Vital Signs BP 127/76 (BP Location: Left Arm)   Pulse 66   Temp 98.4 F (36.9 C) (Oral)   Resp 17   Ht 5\' 5"  (1.651 m)   Wt 185 lb (83.9 kg)   SpO2 100%   BMI 30.79 kg/m   Visual Acuity Right Eye Distance:   Left Eye Distance:   Bilateral Distance:    Right Eye Near:   Left Eye Near:    Bilateral Near:     Physical Exam  Constitutional: She is oriented to person, place, and time. She appears well-developed and well-nourished.  HENT:  Head: Normocephalic and atraumatic.  Right  Ear: External ear normal.  Left Ear: External ear normal.  Mouth/Throat: Oropharynx is clear and moist.  Eyes: Pupils are equal, round, and reactive to light.  Neck: Normal range of motion. Neck supple.  Cardiovascular: Normal rate and regular rhythm.   No murmur heard. Pulmonary/Chest: Effort normal.  Abdominal: Soft.  Musculoskeletal: Normal range of motion. She exhibits no edema.  Lymphadenopathy:    She has no cervical adenopathy.  Neurological: She is alert and oriented to person, place, and time.  Skin: Skin is warm.  Psychiatric: She has a normal mood and affect.  Vitals reviewed.    UC Treatments / Results  Labs (all labs ordered are listed, but only abnormal results are displayed) Labs Reviewed - No data to display  EKG  EKG Interpretation None       ED ECG REPORT I, Kelcee Bjorn H, the attending physician, personally viewed and interpreted this ECG.   Date: 03/09/2017  EKG Time: 15;13:32  Rate:60  Rhythm: normal EKG, normal sinus rhythm, there are no previous tracings available for comparison  Axis: 27  Intervals:none  ST&T Change: none   Radiology No results found.  Procedures Procedures (including critical care time)  Medications Ordered in UC Medications - No data to display   Initial Impression / Assessment and Plan / UC Course  I have  reviewed the triage vital signs and the nursing notes.  Pertinent labs & imaging results that were available during my care of the patient were reviewed by me and considered in my medical decision making (see chart for details).     Will have patient go to the ED of her choice if symptoms get worse or return explained to her this is ED but urgent care recommend strongly following up with Dr. Nehemiah Massed the cardiologist and would recommend calling his office tomorrow to set up an appointment. 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 No new medications and stress if symptoms get worse go to the ED.    Final Clinical Impressions(s) / UC Diagnoses   Final diagnoses:  Heart palpitations  Bradycardia    New Prescriptions New Prescriptions   No medications on file   Note: This dictation was prepared with Dragon dictation along with smaller phrase technology. Any transcriptional errors that result from this process are unintentional.  Controlled Substance Prescriptions [place] Controlled Substance Registry consulted? Not Applicable   Frederich Cha, MD 03/09/17 1626    Frederich Cha, MD 03/09/17 [phone]

## 2017-03-28 ENCOUNTER — Other Ambulatory Visit: Payer: Self-pay | Admitting: Internal Medicine

## 2017-03-28 NOTE — Telephone Encounter (Signed)
lvm to have patient call back about scheduling appt for cpe.

## 2017-05-02 ENCOUNTER — Encounter: Payer: Self-pay | Admitting: Internal Medicine

## 2017-05-02 ENCOUNTER — Ambulatory Visit: Payer: BC Managed Care – PPO | Admitting: Internal Medicine

## 2017-05-02 VITALS — BP 118/64 | HR 60 | Ht 65.0 in | Wt 185.0 lb

## 2017-05-02 DIAGNOSIS — Z170 Estrogen receptor positive status [ER+]: Secondary | ICD-10-CM | POA: Diagnosis not present

## 2017-05-02 DIAGNOSIS — C50412 Malignant neoplasm of upper-outer quadrant of left female breast: Secondary | ICD-10-CM | POA: Diagnosis not present

## 2017-05-02 DIAGNOSIS — N3000 Acute cystitis without hematuria: Secondary | ICD-10-CM

## 2017-05-02 LAB — POC URINALYSIS WITH MICROSCOPIC (NON AUTO)MANUAL RESULT
BILIRUBIN UA: NEGATIVE
CRYSTALS: 0
Epithelial cells, urine per micros: 2
Glucose, UA: NEGATIVE
Ketones, UA: NEGATIVE
Mucus, UA: 0
NITRITE UA: NEGATIVE
PH UA: 6 (ref 5.0–8.0)
RBC: 15 M/uL — AB (ref 4.04–5.48)
Spec Grav, UA: 1.01 (ref 1.010–1.025)
UROBILINOGEN UA: 0.2 U/dL
WBC CASTS UA: 30

## 2017-05-02 MED ORDER — CIPROFLOXACIN HCL 250 MG PO TABS: 250.0000 mg | ORAL_TABLET | Freq: Two times a day (BID) | ORAL | 0 refills | Status: AC

## 2017-05-02 MED ORDER — AZELASTINE HCL 0.1 % NA SOLN: 2.0000 | Freq: Two times a day (BID) | NASAL | 5 refills | Status: DC

## 2017-05-02 MED ORDER — FLUTICASONE PROPIONATE 50 MCG/ACT NA SUSP: 2.0000 | Freq: Every day | NASAL | 0 refills | Status: DC

## 2017-05-02 NOTE — Patient Instructions (Signed)

## 2017-05-02 NOTE — Progress Notes (Signed)
Date:  05/02/2017   Name:  Holly Villegas   DOB:  May 07, 1961   MRN:  [id number]   Chief Complaint: Urinary Tract Infection (Started Saturday- constantly going to bathroom and then nothing comes out. Pressure in bladder. Took Cystex OTC for last two days. Still having pain. ) and Allergic Rhinitis  (Refill on both nasal spray.)  Urinary Tract Infection   This is a new problem. The current episode started in the past 7 days. The problem has been unchanged. The quality of the pain is described as burning. The patient is experiencing no pain. There has been no fever. Associated symptoms include frequency, hesitancy and urgency. Pertinent negatives include no chills. She has tried increased fluids for the symptoms.     Review of Systems  Constitutional: Negative for chills, fatigue and fever.  Respiratory: Negative for chest tightness and shortness of breath.   Cardiovascular: Negative for chest pain.  Gastrointestinal: Negative for abdominal pain, constipation and diarrhea.  Genitourinary: Positive for dysuria, frequency, hesitancy and urgency. Negative for genital sores, menstrual problem and pelvic pain.  Musculoskeletal: Negative for arthralgias and back pain.    Patient Active Problem List   Diagnosis Date Noted  . Lymphedema 02/03/2017  . Lumbar degenerative disc disease 06/16/2016  . Bilateral sciatica 06/11/2016  . Malignant neoplasm of upper-outer quadrant of left breast in female, estrogen receptor positive ([place]) 03/26/2016  . Aortic heart valve narrowing 09/15/2015  . Paroxysmal supraventricular tachycardia ([place]) 08/07/2015  . Left wrist tendonitis 06/09/2015  . Plantar fasciitis, bilateral 11/29/2014  . Vitamin D deficiency 11/29/2014  . Dupuytren's contracture of foot 11/29/2014  . Dyslipidemia 11/28/2014  . Environmental and seasonal allergies 11/28/2014  . Raynaud's syndrome without gangrene 11/28/2014  . Carpal tunnel syndrome 11/28/2014  . Benign colonic  polyp 11/28/2014  . Diverticulosis of colon without diverticulitis 11/28/2014  . Essential hypertension 11/28/2014  . Impaired renal function 11/28/2014  . Asymptomatic varicose veins 11/28/2014  . Raynaud's phenomenon 11/28/2014    Prior to Admission medications   Medication Sig Start Date End Date Taking? Authorizing Provider  acetaminophen (TYLENOL) 500 MG tablet Take 1 tablet by mouth once as needed.   Yes [provider]  atorvastatin (LIPITOR) 20 MG tablet TAKE ONE TABLET AT BEDTIME. 12/29/16  Yes Glean Hess, MD  cetirizine (ZYRTEC) 10 MG tablet TAKE (1) TABLET BY MOUTH EVERY DAY 06/17/15  Yes Glean Hess, MD  esomeprazole (NEXIUM) 40 MG capsule Take 1 tablet by mouth daily. 06/27/13  Yes [provider]  fluticasone (FLONASE) 50 MCG/ACT nasal spray Place 2 sprays into both nostrils daily. 07/01/15  Yes Frederich Cha, MD  lisinopril-hydrochlorothiazide (PRINZIDE,ZESTORETIC) 20-25 MG tablet TAKE (1) TABLET BY MOUTH EVERY DAY 03/28/17  Yes Glean Hess, MD  Vitamin D, Ergocalciferol, (DRISDOL) 50000 units CAPS capsule Take 50,000 Units by mouth every 7 (seven) days.   Yes [provider]  azelastine (ASTELIN) 0.1 % nasal spray INSTILL 1 SPRAY INTO EACH NOSTRIL ONCE DAILY AS DIRECTED BY PHYSICIAN Patient not taking: Reported on 05/02/2017 03/05/17   Glean Hess, MD    Allergies  Allergen Reactions  . Codeine Sulfate Nausea And Vomiting  . Shellfish-Derived Products   . Sulfa Antibiotics Nausea And Vomiting  . Brassica Oleracea Italica Nausea And Vomiting and Other (See Comments)    Does not digest and vomits up    Past Surgical History:  Procedure Laterality Date  . BREAST BIOPSY Left 02/24/2016   path pending  . CHOLECYSTECTOMY  2012  . ESOPHAGEAL DILATION  07/2009  . THROAT SURGERY  2011   polyp removed from vocal cord  . TUBAL LIGATION  1991    Social History   Tobacco Use  . Smoking status: Never Smoker  . Smokeless  tobacco: Never Used  Substance Use Topics  . Alcohol use: Yes    Alcohol/week: 0.0 oz    Comment: social  . Drug use: No     Medication list has been reviewed and updated.  PHQ 2/9 Scores 05/02/2017  PHQ - 2 Score 0    Physical Exam  Constitutional: She appears well-developed and well-nourished.  Cardiovascular: Normal rate, regular rhythm and normal heart sounds.  Pulmonary/Chest: Effort normal and breath sounds normal. No respiratory distress.  Abdominal: Soft. Bowel sounds are normal. There is tenderness in the suprapubic area. There is no rebound, no guarding and no CVA tenderness.  Psychiatric: She has a normal mood and affect.  Nursing note and vitals reviewed.   BP 118/64   Pulse 60   Ht 5\' 5"  (1.651 m)   Wt 185 lb (83.9 kg)   SpO2 100%   BMI 30.79 kg/m   Assessment and Plan: 1. Acute cystitis without hematuria Continue fluids - ciprofloxacin (CIPRO) 250 MG tablet; Take 1 tablet (250 mg total) 2 (two) times daily for 7 days by mouth.  Dispense: 14 tablet; Refill: 0 - POC urinalysis w microscopic (non auto)  2. Malignant neoplasm of upper-outer quadrant of left breast in female, estrogen receptor positive ([place])   Meds ordered this encounter  Medications  . azelastine (ASTELIN) 0.1 % nasal spray    Sig: Place 2 sprays 2 (two) times daily into both nostrils. Use in each nostril as directed    Dispense:  30 mL    Refill:  5  . fluticasone (FLONASE) 50 MCG/ACT nasal spray    Sig: Place 2 sprays daily into both nostrils.    Dispense:  16 g    Refill:  0  . ciprofloxacin (CIPRO) 250 MG tablet    Sig: Take 1 tablet (250 mg total) 2 (two) times daily for 7 days by mouth.    Dispense:  14 tablet    Refill:  0    Partially dictated using Editor, commissioning. Any errors are unintentional.  Halina Maidens, MD [facility]  05/02/2017

## 2017-05-05 ENCOUNTER — Other Ambulatory Visit: Payer: Self-pay | Admitting: Internal Medicine

## 2017-05-05 ENCOUNTER — Telehealth: Payer: Self-pay | Admitting: Internal Medicine

## 2017-05-05 MED ORDER — NITROFURANTOIN MONOHYD MACRO 100 MG PO CAPS: 100.0000 mg | ORAL_CAPSULE | Freq: Two times a day (BID) | ORAL | 0 refills | Status: DC

## 2017-05-05 NOTE — Telephone Encounter (Signed)
Please notice previous message. Patient called. Only has taking antibiotics for three days but getting worse.

## 2017-05-05 NOTE — Telephone Encounter (Signed)
Patient complained that her symptoms seem as though its getting worse and that there is some blood in urine. Patient is currently taking  ciprofloxacin (CIPRO) 250 MG tablet and been taking it since Monday November 12.-please advise

## 2017-06-06 ENCOUNTER — Other Ambulatory Visit: Payer: Self-pay | Admitting: Internal Medicine

## 2017-09-05 ENCOUNTER — Other Ambulatory Visit: Payer: Self-pay | Admitting: Internal Medicine

## 2017-10-03 ENCOUNTER — Other Ambulatory Visit: Payer: Self-pay | Admitting: Internal Medicine

## 2017-12-27 ENCOUNTER — Encounter: Payer: Self-pay | Admitting: Internal Medicine

## 2017-12-27 ENCOUNTER — Ambulatory Visit: Payer: BC Managed Care – PPO | Admitting: Internal Medicine

## 2017-12-27 VITALS — BP 112/86 | HR 76 | Ht 64.0 in | Wt 174.8 lb

## 2017-12-27 DIAGNOSIS — J3089 Other allergic rhinitis: Secondary | ICD-10-CM | POA: Diagnosis not present

## 2017-12-27 DIAGNOSIS — I10 Essential (primary) hypertension: Secondary | ICD-10-CM

## 2017-12-27 DIAGNOSIS — E559 Vitamin D deficiency, unspecified: Secondary | ICD-10-CM | POA: Diagnosis not present

## 2017-12-27 DIAGNOSIS — E785 Hyperlipidemia, unspecified: Secondary | ICD-10-CM | POA: Diagnosis not present

## 2017-12-27 DIAGNOSIS — K219 Gastro-esophageal reflux disease without esophagitis: Secondary | ICD-10-CM | POA: Diagnosis not present

## 2017-12-27 MED ORDER — OMEPRAZOLE 20 MG PO CPDR: 20.0000 mg | DELAYED_RELEASE_CAPSULE | Freq: Every day | ORAL | 5 refills | Status: DC

## 2017-12-27 MED ORDER — LISINOPRIL-HYDROCHLOROTHIAZIDE 20-25 MG PO TABS: 1.0000 | ORAL_TABLET | Freq: Every day | ORAL | 5 refills | Status: DC

## 2017-12-27 MED ORDER — AZELASTINE HCL 0.1 % NA SOLN: 2.0000 | Freq: Two times a day (BID) | NASAL | 5 refills | Status: DC

## 2017-12-27 MED ORDER — FLUTICASONE PROPIONATE 50 MCG/ACT NA SUSP: 2.0000 | Freq: Every day | NASAL | 5 refills | Status: DC

## 2017-12-27 NOTE — Progress Notes (Signed)
Date:  12/27/2017   Name:  Holly Villegas   DOB:  [birth year]   MRN:  [id number]   Chief Complaint: Hypertension Hypertension  This is a chronic problem. The problem is unchanged. The problem is controlled. Pertinent negatives include no chest pain, headaches, palpitations or shortness of breath. Risk factors for coronary artery disease include stress. Past treatments include ACE inhibitors and diuretics. The current treatment provides significant improvement.  Hyperlipidemia  This is a chronic problem. The problem is controlled. Pertinent negatives include no chest pain or shortness of breath. Current antihyperlipidemic treatment includes statins. The current treatment provides significant improvement of lipids. There are no compliance problems (pt wonders if she can try off of medication since she is doing weight watchers and losing weight).   Gastroesophageal Reflux  She complains of heartburn. She reports no abdominal pain, no chest pain or no coughing. This is a chronic problem. The problem occurs rarely. Pertinent negatives include no fatigue. She has tried a PPI for the symptoms. The treatment provided significant relief.  Allergies - using 2 nasal sprays.  Trying to avoid antihistamines due to dry throat.  Takes zyrtec only if sx are severe. Vitamin D def - has been taking supplements.  Has not had levels checked in some time. Lab Results  Component Value Date   CREATININE 0.80 12/21/2016   BUN 9 12/21/2016   NA 134 12/21/2016   K 4.8 12/21/2016   CL 92 (L) 12/21/2016   CO2 27 12/21/2016   Lab Results  Component Value Date   CHOL 175 12/21/2016   HDL 66 12/21/2016   LDLCALC 86 12/21/2016   TRIG 114 12/21/2016   CHOLHDL 2.7 12/21/2016   Lab Results  Component Value Date   ALT 26 12/21/2016   AST 27 12/21/2016   ALKPHOS 111 12/21/2016   BILITOT 0.3 12/21/2016     Review of Systems  Constitutional: Negative for chills, fatigue and fever.  Respiratory: Negative  for cough, chest tightness and shortness of breath.   Cardiovascular: Negative for chest pain, palpitations and leg swelling.  Gastrointestinal: Positive for diarrhea (with gluten - trying to cut it out to see if it helps) and heartburn. Negative for abdominal pain and constipation.  Neurological: Negative for dizziness and headaches.  Hematological: Negative for adenopathy.  Psychiatric/Behavioral: Negative for dysphoric mood and sleep disturbance. The patient is not nervous/anxious.     Patient Active Problem List   Diagnosis Date Noted  . Lymphedema 02/03/2017  . Lumbar degenerative disc disease 06/16/2016  . Bilateral sciatica 06/11/2016  . Malignant neoplasm of upper-outer quadrant of left breast in female, estrogen receptor positive ([place]) 03/26/2016  . Aortic heart valve narrowing 09/15/2015  . Paroxysmal supraventricular tachycardia ([place]) 08/07/2015  . Left wrist tendonitis 06/09/2015  . Plantar fasciitis, bilateral 11/29/2014  . Vitamin D deficiency 11/29/2014  . Dupuytren's contracture of foot 11/29/2014  . Dyslipidemia 11/28/2014  . Environmental and seasonal allergies 11/28/2014  . Raynaud's syndrome without gangrene 11/28/2014  . Carpal tunnel syndrome 11/28/2014  . Benign colonic polyp 11/28/2014  . Diverticulosis of colon without diverticulitis 11/28/2014  . Essential hypertension 11/28/2014  . Asymptomatic varicose veins 11/28/2014  . Raynaud's phenomenon 11/28/2014    Prior to Admission medications   Medication Sig Start Date End Date Taking? Authorizing Provider  acetaminophen (TYLENOL) 500 MG tablet Take 1 tablet by mouth once as needed.    [provider]  atorvastatin (LIPITOR) 20 MG tablet TAKE (1) TABLET BY MOUTH DAILY AT BEDTIME  09/05/17   Glean Hess, MD  azelastine (ASTELIN) 0.1 % nasal spray Place 2 sprays 2 (two) times daily into both nostrils. Use in each nostril as directed 05/02/17   Glean Hess, MD  cetirizine (ZYRTEC) 10 MG  tablet TAKE (1) TABLET BY MOUTH EVERY DAY 06/17/15   Glean Hess, MD  esomeprazole (NEXIUM) 40 MG capsule Take 1 tablet by mouth daily. 06/27/13   [provider]  fluticasone (FLONASE) 50 MCG/ACT nasal spray 2 SPRAYS INTO BOTH NOSTRILS DAILY 06/06/17   Glean Hess, MD  lisinopril-hydrochlorothiazide (PRINZIDE,ZESTORETIC) 20-25 MG tablet TAKE (1) TABLET BY MOUTH EVERY DAY 10/03/17   Glean Hess, MD  nitrofurantoin, macrocrystal-monohydrate, (MACROBID) 100 MG capsule Take 1 capsule (100 mg total) 2 (two) times daily by mouth. 05/05/17   Glean Hess, MD  Vitamin D, Ergocalciferol, (DRISDOL) 50000 units CAPS capsule Take 50,000 Units by mouth every 7 (seven) days.    [provider]    Allergies  Allergen Reactions  . Codeine Sulfate Nausea And Vomiting  . Shellfish-Derived Products   . Sulfa Antibiotics Nausea And Vomiting  . Brassica Oleracea Italica Nausea And Vomiting and Other (See Comments)    Does not digest and vomits up    Past Surgical History:  Procedure Laterality Date  . BREAST BIOPSY Left 02/24/2016   path pending  . CHOLECYSTECTOMY  2012  . COLONOSCOPY WITH PROPOFOL N/A 10/13/2015   Procedure: COLONOSCOPY WITH PROPOFOL;  Surgeon: Hulen Luster, MD;  Location[facility] ENDOSCOPY;  Service: Gastroenterology;  Laterality: N/A;  . ESOPHAGEAL DILATION  07/2009  . THROAT SURGERY  2011   polyp removed from vocal cord  . TUBAL LIGATION  1991    Social History   Tobacco Use  . Smoking status: Never Smoker  . Smokeless tobacco: Never Used  Substance Use Topics  . Alcohol use: Yes    Alcohol/week: 0.0 oz    Comment: social  . Drug use: No     Medication list has been reviewed and updated.  Current Meds  Medication Sig  . acetaminophen (TYLENOL) 500 MG tablet Take 1 tablet by mouth once as needed.  Marland Kitchen atorvastatin (LIPITOR) 20 MG tablet TAKE (1) TABLET BY MOUTH DAILY AT BEDTIME  . azelastine (ASTELIN) 0.1 % nasal spray Place 2 sprays 2  (two) times daily into both nostrils. Use in each nostril as directed  . Calcium-Magnesium-Vitamin D (CALCIUM MAGNESIUM PO) Take by mouth.  . cetirizine (ZYRTEC) 10 MG tablet TAKE (1) TABLET BY MOUTH EVERY DAY  . esomeprazole (NEXIUM) 40 MG capsule Take 1 tablet by mouth daily.  . fluticasone (FLONASE) 50 MCG/ACT nasal spray 2 SPRAYS INTO BOTH NOSTRILS DAILY  . lisinopril-hydrochlorothiazide (PRINZIDE,ZESTORETIC) 20-25 MG tablet TAKE (1) TABLET BY MOUTH EVERY DAY  . nitrofurantoin, macrocrystal-monohydrate, (MACROBID) 100 MG capsule Take 1 capsule (100 mg total) 2 (two) times daily by mouth.  . Probiotic Product (PROBIOTIC ADVANCED PO) Take by mouth.  . vitamin C (ASCORBIC ACID) 500 MG tablet Take 500 mg by mouth 2 (two) times daily.  . Vitamin D, Ergocalciferol, (DRISDOL) 50000 units CAPS capsule Take 50,000 Units by mouth every 7 (seven) days.    PHQ 2/9 Scores 05/02/2017  PHQ - 2 Score 0    Physical Exam  Constitutional: She is oriented to person, place, and time. She appears well-developed. No distress.  HENT:  Head: Normocephalic and atraumatic.  Eyes: Pupils are equal, round, and reactive to light.  Neck: Normal range of motion. Neck supple.  Cardiovascular: Normal rate, regular rhythm and normal heart sounds.  Pulmonary/Chest: Effort normal and breath sounds normal. No respiratory distress.  Abdominal: Soft. Bowel sounds are normal. She exhibits no distension. There is no tenderness.  Musculoskeletal: Normal range of motion.  Neurological: She is alert and oriented to person, place, and time.  Skin: Skin is warm and dry. No rash noted.  Psychiatric: She has a normal mood and affect. Her behavior is normal. Thought content normal.  Nursing note and vitals reviewed.   BP 112/86   Pulse 76   Ht 5\' 4"  (1.626 m)   Wt 174 lb 12.8 oz (79.3 kg)   SpO2 97%   BMI 30.00 kg/m   Assessment and Plan: 1. Essential hypertension Controlled on current medication - Comprehensive  metabolic panel - TSH - lisinopril-hydrochlorothiazide (PRINZIDE,ZESTORETIC) 20-25 MG tablet; Take 1 tablet by mouth daily.  Dispense: 30 tablet; Refill: 5 - CBC with Differential/Platelet  2. Dyslipidemia Hold atorvastatin Continue diet, exercise and weight loss Recheck in 6 months - Lipid panel  3. Gastroesophageal reflux disease without esophagitis Controlled - will change to lower cost omeprazole - omeprazole (PRILOSEC) 20 MG capsule; Take 1 capsule (20 mg total) by mouth daily.  Dispense: 30 capsule; Refill: 5  4. Environmental and seasonal allergies - fluticasone (FLONASE) 50 MCG/ACT nasal spray; Place 2 sprays into both nostrils daily.  Dispense: 16 g; Refill: 5 - azelastine (ASTELIN) 0.1 % nasal spray; Place 2 sprays into both nostrils 2 (two) times daily. Use in each nostril as directed  Dispense: 30 mL; Refill: 5  5. Vitamin D deficiency - VITAMIN D 25 Hydroxy (Vit-D Deficiency, Fractures)   Meds ordered this encounter  Medications  . lisinopril-hydrochlorothiazide (PRINZIDE,ZESTORETIC) 20-25 MG tablet    Sig: Take 1 tablet by mouth daily.    Dispense:  30 tablet    Refill:  5  . fluticasone (FLONASE) 50 MCG/ACT nasal spray    Sig: Place 2 sprays into both nostrils daily.    Dispense:  16 g    Refill:  5  . omeprazole (PRILOSEC) 20 MG capsule    Sig: Take 1 capsule (20 mg total) by mouth daily.    Dispense:  30 capsule    Refill:  5  . azelastine (ASTELIN) 0.1 % nasal spray    Sig: Place 2 sprays into both nostrils 2 (two) times daily. Use in each nostril as directed    Dispense:  30 mL    Refill:  5    Partially dictated using Editor, commissioning. Any errors are unintentional.  Halina Maidens, MD [facility]  12/27/2017

## 2017-12-28 LAB — COMPREHENSIVE METABOLIC PANEL
ALK PHOS: 99 IU/L (ref 39–117)
ALT: 12 IU/L (ref 0–32)
AST: 16 IU/L (ref 0–40)
Albumin/Globulin Ratio: 1.6 (ref 1.2–2.2)
Albumin: 4.4 g/dL (ref 3.5–5.5)
BUN / CREAT RATIO: 14 (ref 9–23)
BUN: 11 mg/dL (ref 6–24)
Bilirubin Total: 0.3 mg/dL (ref 0.0–1.2)
CHLORIDE: 93 mmol/L — AB (ref 96–106)
CO2: 25 mmol/L (ref 20–29)
CREATININE: 0.77 mg/dL (ref 0.57–1.00)
Calcium: 9.5 mg/dL (ref 8.7–10.2)
GFR, EST AFRICAN AMERICAN: 98 mL/min/{1.73_m2} (ref >59)
GFR, EST NON AFRICAN AMERICAN: 85 mL/min/{1.73_m2} (ref >59)
GLUCOSE: 85 mg/dL (ref 65–99)
Globulin, Total: 2.7 g/dL (ref 1.5–4.5)
POTASSIUM: 5 mmol/L (ref 3.5–5.2)
SODIUM: 135 mmol/L (ref 134–144)
Total Protein: 7.1 g/dL (ref 6.0–8.5)

## 2017-12-28 LAB — CBC WITH DIFFERENTIAL/PLATELET
Basophils Absolute: 0.1 10*3/uL (ref 0.0–0.2)
Basos: 1 %
EOS (ABSOLUTE): 0.2 10*3/uL (ref 0.0–0.4)
Eos: 3 %
Hematocrit: 39.7 % (ref 34.0–46.6)
Hemoglobin: 13.2 g/dL (ref 11.1–15.9)
Immature Grans (Abs): 0 10*3/uL (ref 0.0–0.1)
Immature Granulocytes: 0 %
LYMPHS ABS: 1.6 10*3/uL (ref 0.7–3.1)
Lymphs: 31 %
MCH: 29 pg (ref 26.6–33.0)
MCHC: 33.2 g/dL (ref 31.5–35.7)
MCV: 87 fL (ref 79–97)
MONOCYTES: 9 %
MONOS ABS: 0.5 10*3/uL (ref 0.1–0.9)
Neutrophils Absolute: 3 10*3/uL (ref 1.4–7.0)
Neutrophils: 56 %
Platelets: 304 10*3/uL (ref 150–450)
RBC: 4.55 x10E6/uL (ref 3.77–5.28)
RDW: 12.4 % (ref 12.3–15.4)
WBC: 5.2 10*3/uL (ref 3.4–10.8)

## 2017-12-28 LAB — LIPID PANEL
CHOL/HDL RATIO: 2.7 ratio (ref 0.0–4.4)
Cholesterol, Total: 186 mg/dL (ref 100–199)
HDL: 70 mg/dL (ref >39)
LDL Calculated: 94 mg/dL (ref 0–99)
Triglycerides: 111 mg/dL (ref 0–149)
VLDL Cholesterol Cal: 22 mg/dL (ref 5–40)

## 2017-12-28 LAB — VITAMIN D 25 HYDROXY (VIT D DEFICIENCY, FRACTURES): Vit D, 25-Hydroxy: 65.7 ng/mL (ref 30.0–100.0)

## 2017-12-28 LAB — TSH: TSH: 1.86 u[IU]/mL (ref 0.450–4.500)

## 2018-04-11 ENCOUNTER — Encounter: Payer: Self-pay | Admitting: Emergency Medicine

## 2018-04-11 ENCOUNTER — Other Ambulatory Visit: Payer: Self-pay

## 2018-04-11 ENCOUNTER — Ambulatory Visit
Admission: EM | Admit: 2018-04-11 | Discharge: 2018-04-11 | Disposition: A | Discharge status: Home / Self Care | Payer: BC Managed Care – PPO | Attending: Family Medicine | Admitting: Family Medicine

## 2018-04-11 DIAGNOSIS — K5732 Diverticulitis of large intestine without perforation or abscess without bleeding: Secondary | ICD-10-CM | POA: Diagnosis not present

## 2018-04-11 DIAGNOSIS — Z8719 Personal history of other diseases of the digestive system: Secondary | ICD-10-CM | POA: Diagnosis not present

## 2018-04-11 HISTORY — DX: Malignant (primary) neoplasm, unspecified: C80.1

## 2018-04-11 MED ORDER — AMOXICILLIN-POT CLAVULANATE 500-125 MG PO TABS: 1.0000 | ORAL_TABLET | Freq: Three times a day (TID) | ORAL | 0 refills | Status: DC

## 2018-04-11 NOTE — ED Triage Notes (Signed)
Patient c/o abdominal pain that started on Sunday.  Patient reports some gas and bloating.  Patient denies diarrhea.

## 2018-04-11 NOTE — Discharge Instructions (Signed)
Gradually increase the fiber in your diet.  Plenty of fluids.  Follow-up with your GI specialist as soon as possible.

## 2018-04-11 NOTE — ED Provider Notes (Addendum)
[facility]    CSN: [id number] Arrival date & time: 04/11/18  1613     History   Chief Complaint Chief Complaint  Patient presents with  . Abdominal Pain    HPI Holly Villegas is a 57 y.o. female.   HPI  57 year old female with a history of diverticulitis "years ago" presents with abdominal pain that started on Sunday.  She states that the pain is mostly suprapubic and left lower quadrant.  Has had  constipation denies any diarrhea.  She has had no nausea or vomiting.  Denies fever or chills.  She has not noticed any bleeding in her stool but has had mucus.  Not have good memory as to what they did for her diverticulitis in the past.  Recently undergone a colonoscopy did confirm diverticulosis on 10/13/2015.  She has had gas and bloating with this episode.  Patient is afebrile         Past Medical History:  Diagnosis Date  . Cancer ([place])   . Diverticulosis   . GERD (gastroesophageal reflux disease)   . Hypercholesteremia   . Hypertension     Patient Active Problem List   Diagnosis Date Noted  . Lymphedema 02/03/2017  . Lumbar degenerative disc disease 06/16/2016  . Bilateral sciatica 06/11/2016  . Malignant neoplasm of upper-outer quadrant of left breast in female, estrogen receptor positive ([place]) 03/26/2016  . Aortic heart valve narrowing 09/15/2015  . Paroxysmal supraventricular tachycardia ([place]) 08/07/2015  . Left wrist tendonitis 06/09/2015  . Plantar fasciitis, bilateral 11/29/2014  . Vitamin D deficiency 11/29/2014  . Dupuytren's contracture of foot 11/29/2014  . Dyslipidemia 11/28/2014  . Environmental and seasonal allergies 11/28/2014  . Raynaud's syndrome without gangrene 11/28/2014  . Carpal tunnel syndrome 11/28/2014  . Benign colonic polyp 11/28/2014  . Diverticulosis of colon without diverticulitis 11/28/2014  . Essential hypertension 11/28/2014  . Asymptomatic varicose veins 11/28/2014  . Raynaud's phenomenon 11/28/2014     Past Surgical History:  Procedure Laterality Date  . BREAST BIOPSY Left 02/24/2016   path pending  . CHOLECYSTECTOMY  2012  . COLONOSCOPY WITH PROPOFOL N/A 10/13/2015   Procedure: COLONOSCOPY WITH PROPOFOL;  Surgeon: Hulen Luster, MD;  Location[facility] ENDOSCOPY;  Service: Gastroenterology;  Laterality: N/A;  . ESOPHAGEAL DILATION  07/2009  . THROAT SURGERY  2011   polyp removed from vocal cord  . TUBAL LIGATION  1991    OB History   None      Home Medications    Prior to Admission medications   Medication Sig Start Date End Date Taking? Authorizing Provider  acetaminophen (TYLENOL) 500 MG tablet Take 1 tablet by mouth once as needed.   Yes [provider]  Calcium-Magnesium-Vitamin D (CALCIUM MAGNESIUM PO) Take by mouth.   Yes [provider]  cetirizine (ZYRTEC) 10 MG tablet TAKE (1) TABLET BY MOUTH EVERY DAY 06/17/15  Yes Glean Hess, MD  fluticasone [facility]) 50 MCG/ACT nasal spray Place 2 sprays into both nostrils daily. 12/27/17  Yes Glean Hess, MD  lisinopril-hydrochlorothiazide (PRINZIDE,ZESTORETIC) 20-25 MG tablet Take 1 tablet by mouth daily. 12/27/17  Yes Glean Hess, MD  omeprazole (PRILOSEC) 20 MG capsule Take 1 capsule (20 mg total) by mouth daily. 12/27/17  Yes Glean Hess, MD  Probiotic Product (PROBIOTIC ADVANCED PO) Take by mouth.   Yes [provider]  vitamin C (ASCORBIC ACID) 500 MG tablet Take 500 mg by mouth 2 (two) times daily.   Yes [provider]  amoxicillin-clavulanate (AUGMENTIN) 500-125 MG tablet Take 1 tablet (500 mg total) by mouth every 8 (eight) hours. 04/11/18   Lorin Picket, PA-C  azelastine (ASTELIN) 0.1 % nasal spray Place 2 sprays into both nostrils 2 (two) times daily. Use in each nostril as directed 12/27/17   Glean Hess, MD    Family History Family History  Problem Relation Age of Onset  . Hypertension Mother   . Migraines Mother   . Peptic Ulcer Disease Father   .  Breast cancer Maternal Aunt 63    Social History Social History   Tobacco Use  . Smoking status: Never Smoker  . Smokeless tobacco: Never Used  Substance Use Topics  . Alcohol use: Yes    Alcohol/week: 0.0 standard drinks    Comment: social  . Drug use: No     Allergies   Codeine sulfate; Shellfish-derived products; Sulfa antibiotics; and Brassica oleracea italica   Review of Systems Review of Systems  Constitutional: Positive for activity change and appetite change. Negative for chills, fatigue and fever.  Gastrointestinal: Positive for abdominal distention, abdominal pain and constipation. Negative for anal bleeding, blood in stool, diarrhea, nausea and vomiting.  All other systems reviewed and are negative.    Physical Exam Triage Vital Signs ED Triage Vitals  Enc Vitals Group     BP 04/11/18 1626 98/62     Pulse Rate 04/11/18 1626 65     Resp 04/11/18 1626 16     Temp 04/11/18 1626 98.6 F (37 C)     Temp Source 04/11/18 1626 Oral     SpO2 04/11/18 1626 100 %     Weight 04/11/18 1621 187 lb (84.8 kg)     Height 04/11/18 1621 5\' 5"  (1.651 m)     Head Circumference --      Peak Flow --      Pain Score 04/11/18 1620 7     Pain Loc --      Pain Edu? --      Excl. in [place]? --    No data found.  Updated Vital Signs BP 98/62 (BP Location: Left Arm)   Pulse 65   Temp 98.6 F (37 C) (Oral)   Resp 16   Ht 5\' 5"  (1.651 m)   Wt 187 lb (84.8 kg)   SpO2 100%   BMI 31.12 kg/m   Visual Acuity Right Eye Distance:   Left Eye Distance:   Bilateral Distance:    Right Eye Near:   Left Eye Near:    Bilateral Near:     Physical Exam  Constitutional: She is oriented to person, place, and time. She appears well-developed and well-nourished.  Non-toxic appearance. She does not appear ill. No distress.  HENT:  Head: Normocephalic.  Eyes: Pupils are equal, round, and reactive to light.  Pulmonary/Chest: Effort normal and breath sounds normal.  Abdominal: Soft.  Bowel sounds are normal. She exhibits distension. There is tenderness in the suprapubic area and left lower quadrant. There is no rigidity, no rebound and no guarding.  Neurological: She is alert and oriented to person, place, and time.  Skin: Skin is warm and dry.  Psychiatric: She has a normal mood and affect. Her behavior is normal.  Nursing note and vitals reviewed.    UC Treatments / Results  Labs (all labs ordered are listed, but only abnormal results are displayed) Labs Reviewed - No data to display  EKG None  Radiology No results found.  Procedures Procedures (including critical  care time)  Medications Ordered in UC Medications - No data to display  Initial Impression / Assessment and Plan / UC Course  I have reviewed the triage vital signs and the nursing notes.  Pertinent labs & imaging results that were available during my care of the patient were reviewed by me and considered in my medical decision making (see chart for details).    Patient likely has another attack of diverticulitis.  Is to be mild at this time do not believe that imaging is necessary.  Ointment with a GI physician on November 11 but I have asked her to try and get an earlier possibly if someone cancels.  Start her on Augmentin 500 mg 3 times daily for 7 days.  Use fluids.  Also gradually increase fiber in the diet to a high-fiber diet.  He has any worsening of her symptoms has any blood in her stool etc. she should go to the emergency room immediately.  Final Clinical Impressions(s) / UC Diagnoses   Final diagnoses:  Diverticulitis of colon without hemorrhage     Discharge Instructions     Gradually increase the fiber in your diet.  Plenty of fluids.  Follow-up with your GI specialist as soon as possible.    ED Prescriptions    Medication Sig Dispense Auth. Provider   amoxicillin-clavulanate (AUGMENTIN) 500-125 MG tablet Take 1 tablet (500 mg total) by mouth every 8 (eight) hours. 21  tablet Lorin Picket, PA-C     Controlled Substance Prescriptions [place] Controlled Substance Registry consulted? Not Applicable  Lorin Picket, PA-C 04/11/18 1735  Lorin Picket, PA-C 04/11/18 1736

## 2018-06-22 ENCOUNTER — Encounter: Payer: Self-pay | Admitting: Internal Medicine

## 2018-06-22 ENCOUNTER — Ambulatory Visit: Payer: BC Managed Care – PPO | Admitting: Internal Medicine

## 2018-06-22 VITALS — BP 118/78 | HR 62 | Resp 16 | Ht 65.0 in | Wt 183.0 lb

## 2018-06-22 DIAGNOSIS — E785 Hyperlipidemia, unspecified: Secondary | ICD-10-CM

## 2018-06-22 DIAGNOSIS — I10 Essential (primary) hypertension: Secondary | ICD-10-CM

## 2018-06-22 DIAGNOSIS — J3089 Other allergic rhinitis: Secondary | ICD-10-CM

## 2018-06-22 DIAGNOSIS — K219 Gastro-esophageal reflux disease without esophagitis: Secondary | ICD-10-CM | POA: Diagnosis not present

## 2018-06-22 LAB — HM PAP SMEAR: HM Pap smear: NEGATIVE

## 2018-06-22 MED ORDER — LISINOPRIL-HYDROCHLOROTHIAZIDE 20-25 MG PO TABS: 1.0000 | ORAL_TABLET | Freq: Every day | ORAL | 5 refills | Status: DC

## 2018-06-22 MED ORDER — OMEPRAZOLE 20 MG PO CPDR: 20.0000 mg | DELAYED_RELEASE_CAPSULE | Freq: Every day | ORAL | 5 refills | Status: DC

## 2018-06-22 MED ORDER — AZELASTINE HCL 0.1 % NA SOLN: 2.0000 | Freq: Two times a day (BID) | NASAL | 5 refills | Status: DC

## 2018-06-22 MED ORDER — FLUTICASONE PROPIONATE 50 MCG/ACT NA SUSP: 2.0000 | Freq: Every day | NASAL | 5 refills | Status: DC

## 2018-06-22 NOTE — Progress Notes (Signed)
Date:  06/22/2018   Name:  Holly Villegas   DOB:  [birth year]   MRN:  [id number]   Chief Complaint: Hypertension She is doing well.  She will see GYN today for Pap.  She still sees [facility] for breast follow up. She is planning to retire this spring.  Hypertension  This is a chronic problem. The problem is controlled. Associated symptoms include palpitations (intermittent). Pertinent negatives include no chest pain, headaches or shortness of breath. Past treatments include ACE inhibitors and diuretics.  Hyperlipidemia  This is a chronic problem. The problem is controlled (previously on atorvastatin - stopped last visit). Pertinent negatives include no chest pain or shortness of breath.   Lab Results  Component Value Date   CHOL 186 12/27/2017   HDL 70 12/27/2017   LDLCALC 94 12/27/2017   TRIG 111 12/27/2017   CHOLHDL 2.7 12/27/2017     Review of Systems  Constitutional: Negative for chills, fatigue, fever and unexpected weight change.  HENT: Negative for trouble swallowing.   Eyes: Negative for visual disturbance.  Respiratory: Negative for cough, chest tightness, shortness of breath and wheezing.   Cardiovascular: Positive for palpitations (intermittent). Negative for chest pain and leg swelling.  Gastrointestinal: Negative for abdominal pain, constipation and diarrhea.       Gerd controlled  Genitourinary: Negative for menstrual problem.  Allergic/Immunologic: Positive for environmental allergies and food allergies (gluten sensitivity).  Neurological: Negative for dizziness and headaches.  Psychiatric/Behavioral: Negative for dysphoric mood. The patient is not nervous/anxious.     Patient Active Problem List   Diagnosis Date Noted  . Lymphedema 02/03/2017  . Lumbar degenerative disc disease 06/16/2016  . Bilateral sciatica 06/11/2016  . Malignant neoplasm of upper-outer quadrant of left breast in female, estrogen receptor positive ([place]) 03/26/2016  . Aortic  heart valve narrowing 09/15/2015  . Paroxysmal supraventricular tachycardia ([place]) 08/07/2015  . Left wrist tendonitis 06/09/2015  . Plantar fasciitis, bilateral 11/29/2014  . Vitamin D deficiency 11/29/2014  . Dupuytren's contracture of foot 11/29/2014  . Dyslipidemia 11/28/2014  . Environmental and seasonal allergies 11/28/2014  . Raynaud's syndrome without gangrene 11/28/2014  . Carpal tunnel syndrome 11/28/2014  . Benign colonic polyp 11/28/2014  . Diverticulosis of colon without diverticulitis 11/28/2014  . Essential hypertension 11/28/2014  . Asymptomatic varicose veins 11/28/2014  . Raynaud's phenomenon 11/28/2014    Allergies  Allergen Reactions  . Codeine Sulfate Nausea And Vomiting  . Gluten Meal   . Shellfish-Derived Products   . Sulfa Antibiotics Nausea And Vomiting  . Brassica Oleracea Italica Nausea And Vomiting and Other (See Comments)    Does not digest and vomits up    Past Surgical History:  Procedure Laterality Date  . BREAST BIOPSY Left 02/24/2016   path pending  . CHOLECYSTECTOMY  2012  . COLONOSCOPY WITH PROPOFOL N/A 10/13/2015   Procedure: COLONOSCOPY WITH PROPOFOL;  Surgeon: Hulen Luster, MD;  Location: [facility] ENDOSCOPY;  Service: Gastroenterology;  Laterality: N/A;  . ESOPHAGEAL DILATION  07/2009  . THROAT SURGERY  2011   polyp removed from vocal cord  . TUBAL LIGATION  1991    Social History   Tobacco Use  . Smoking status: Never Smoker  . Smokeless tobacco: Never Used  Substance Use Topics  . Alcohol use: Yes    Alcohol/week: 0.0 standard drinks    Comment: social  . Drug use: No     Medication list has been reviewed and updated.  Current Meds  Medication Sig  . acetaminophen (  TYLENOL) 500 MG tablet Take 1 tablet by mouth once as needed.  Marland Kitchen azelastine (ASTELIN) 0.1 % nasal spray Place 2 sprays into both nostrils 2 (two) times daily. Use in each nostril as directed  . Calcium-Magnesium-Vitamin D (CALCIUM MAGNESIUM PO) Take by mouth.  .  cetirizine (ZYRTEC) 10 MG tablet TAKE (1) TABLET BY MOUTH EVERY DAY  . fluticasone (FLONASE) 50 MCG/ACT nasal spray Place 2 sprays into both nostrils daily.  Marland Kitchen lisinopril-hydrochlorothiazide (PRINZIDE,ZESTORETIC) 20-25 MG tablet Take 1 tablet by mouth daily.  Marland Kitchen omeprazole (PRILOSEC) 20 MG capsule Take 1 capsule (20 mg total) by mouth daily.  . Probiotic Product (PROBIOTIC ADVANCED PO) Take by mouth.  . vitamin C (ASCORBIC ACID) 500 MG tablet Take 500 mg by mouth 2 (two) times daily.  . [DISCONTINUED] atorvastatin (LIPITOR) 20 MG tablet atorvastatin 20 mg tablet    PHQ 2/9 Scores 06/22/2018 05/02/2017  PHQ - 2 Score 1 0    Physical Exam Vitals signs and nursing note reviewed.  Constitutional:      General: She is not in acute distress.    Appearance: She is well-developed.  HENT:     Head: Normocephalic and atraumatic.  Neck:     Musculoskeletal: Normal range of motion and neck supple.  Cardiovascular:     Rate and Rhythm: Normal rate and regular rhythm.     Pulses: Normal pulses.  Pulmonary:     Effort: Pulmonary effort is normal. No respiratory distress.     Breath sounds: No stridor. No wheezing or rhonchi.  Musculoskeletal: Normal range of motion.  Skin:    General: Skin is warm and dry.     Findings: No rash.  Neurological:     Mental Status: She is alert and oriented to person, place, and time.  Psychiatric:        Behavior: Behavior normal.        Thought Content: Thought content normal.    Wt Readings from Last 3 Encounters:  06/22/18 183 lb (83 kg)  04/11/18 187 lb (84.8 kg)  12/27/17 174 lb 12.8 oz (79.3 kg)    BP 118/78   Pulse 62   Resp 16   Ht 5\' 5"  (1.651 m)   Wt 183 lb (83 kg)   SpO2 100%   BMI 30.45 kg/m   Assessment and Plan: 1. Essential hypertension Controlled, continue current therapy Continue to work on weight loss - lisinopril-hydrochlorothiazide (PRINZIDE,ZESTORETIC) 20-25 MG tablet; Take 1 tablet by mouth daily.  Dispense: 30 tablet;  Refill: 5 - Comprehensive metabolic panel - CBC with Differential/Platelet - TSH  2. Dyslipidemia Recheck labs and advise - Lipid panel  3. Gastroesophageal reflux disease without esophagitis controlled - omeprazole (PRILOSEC) 20 MG capsule; Take 1 capsule (20 mg total) by mouth daily.  Dispense: 30 capsule; Refill: 5  4. Environmental and seasonal allergies - azelastine (ASTELIN) 0.1 % nasal spray; Place 2 sprays into both nostrils 2 (two) times daily. Use in each nostril as directed  Dispense: 30 mL; Refill: 5 - fluticasone (FLONASE) 50 MCG/ACT nasal spray; Place 2 sprays into both nostrils daily.  Dispense: 16 g; Refill: 5   Partially dictated using Editor, commissioning. Any errors are unintentional.  Halina Maidens, MD [facility]  06/22/2018

## 2018-06-23 LAB — CBC WITH DIFFERENTIAL/PLATELET
Basophils Absolute: 0.1 10*3/uL (ref 0.0–0.2)
Basos: 1 %
EOS (ABSOLUTE): 0.1 10*3/uL (ref 0.0–0.4)
Eos: 1 %
Hematocrit: 37.5 % (ref 34.0–46.6)
Hemoglobin: 13.2 g/dL (ref 11.1–15.9)
IMMATURE GRANS (ABS): 0 10*3/uL (ref 0.0–0.1)
IMMATURE GRANULOCYTES: 0 %
Lymphocytes Absolute: 1.9 10*3/uL (ref 0.7–3.1)
Lymphs: 39 %
MCH: 29.9 pg (ref 26.6–33.0)
MCHC: 35.2 g/dL (ref 31.5–35.7)
MCV: 85 fL (ref 79–97)
Monocytes Absolute: 0.4 10*3/uL (ref 0.1–0.9)
Monocytes: 8 %
NEUTROS PCT: 51 %
Neutrophils Absolute: 2.5 10*3/uL (ref 1.4–7.0)
Platelets: 279 10*3/uL (ref 150–450)
RBC: 4.41 x10E6/uL (ref 3.77–5.28)
RDW: 13.1 % (ref 12.3–15.4)
WBC: 4.9 10*3/uL (ref 3.4–10.8)

## 2018-06-23 LAB — LIPID PANEL
Chol/HDL Ratio: 3.6 ratio (ref 0.0–4.4)
Cholesterol, Total: 278 mg/dL — ABNORMAL HIGH (ref 100–199)
HDL: 77 mg/dL (ref >39)
LDL Calculated: 158 mg/dL — ABNORMAL HIGH (ref 0–99)
Triglycerides: 213 mg/dL — ABNORMAL HIGH (ref 0–149)
VLDL Cholesterol Cal: 43 mg/dL — ABNORMAL HIGH (ref 5–40)

## 2018-06-23 LAB — COMPREHENSIVE METABOLIC PANEL
A/G RATIO: 1.7 (ref 1.2–2.2)
ALBUMIN: 4.4 g/dL (ref 3.5–5.5)
ALT: 15 IU/L (ref 0–32)
AST: 20 IU/L (ref 0–40)
Alkaline Phosphatase: 89 IU/L (ref 39–117)
BUN / CREAT RATIO: 16 (ref 9–23)
BUN: 13 mg/dL (ref 6–24)
Bilirubin Total: 0.2 mg/dL (ref 0.0–1.2)
CO2: 24 mmol/L (ref 20–29)
Calcium: 9.8 mg/dL (ref 8.7–10.2)
Chloride: 96 mmol/L (ref 96–106)
Creatinine, Ser: 0.81 mg/dL (ref 0.57–1.00)
GFR calc Af Amer: 92 mL/min/{1.73_m2} (ref >59)
GFR calc non Af Amer: 80 mL/min/{1.73_m2} (ref >59)
Globulin, Total: 2.6 g/dL (ref 1.5–4.5)
Glucose: 97 mg/dL (ref 65–99)
Potassium: 4.7 mmol/L (ref 3.5–5.2)
SODIUM: 135 mmol/L (ref 134–144)
Total Protein: 7 g/dL (ref 6.0–8.5)

## 2018-06-23 LAB — TSH: TSH: 2.18 u[IU]/mL (ref 0.450–4.500)

## 2018-06-28 ENCOUNTER — Other Ambulatory Visit: Payer: Self-pay

## 2018-06-28 ENCOUNTER — Encounter: Payer: Self-pay | Admitting: Emergency Medicine

## 2018-06-28 ENCOUNTER — Ambulatory Visit
Admission: EM | Admit: 2018-06-28 | Discharge: 2018-06-28 | Disposition: A | Discharge status: Home / Self Care | Payer: BC Managed Care – PPO | Attending: Family Medicine | Admitting: Family Medicine

## 2018-06-28 DIAGNOSIS — J101 Influenza due to other identified influenza virus with other respiratory manifestations: Secondary | ICD-10-CM | POA: Diagnosis not present

## 2018-06-28 LAB — RAPID INFLUENZA A&B ANTIGENS
Influenza A (ARMC): POSITIVE — AB
Influenza B (ARMC): NEGATIVE

## 2018-06-28 MED ORDER — ONDANSETRON 8 MG PO TBDP: 8.0000 mg | ORAL_TABLET | Freq: Three times a day (TID) | ORAL | 0 refills | Status: DC | PRN

## 2018-06-28 MED ORDER — OSELTAMIVIR PHOSPHATE 75 MG PO CAPS: 75.0000 mg | ORAL_CAPSULE | Freq: Two times a day (BID) | ORAL | 0 refills | Status: DC

## 2018-06-28 NOTE — ED Provider Notes (Signed)
[facility]    CSN: [id number] Arrival date & time: 06/28/18  1606     History   Chief Complaint Chief Complaint  Patient presents with  . Cough  . Nasal Congestion    HPI Holly Villegas is a 58 y.o. female.   The history is provided by the patient.  URI  Presenting symptoms: congestion, cough, fever and rhinorrhea   Severity:  Moderate Onset quality:  Sudden Duration:  2 days Timing:  Constant Progression:  Worsening Chronicity:  New Relieved by:  None tried Ineffective treatments:  None tried Associated symptoms: myalgias   Associated symptoms: no wheezing   Risk factors: sick contacts     Past Medical History:  Diagnosis Date  . Cancer ([place])   . Diverticulosis   . GERD (gastroesophageal reflux disease)   . Hypercholesteremia   . Hypertension     Patient Active Problem List   Diagnosis Date Noted  . Lymphedema 02/03/2017  . Lumbar degenerative disc disease 06/16/2016  . Bilateral sciatica 06/11/2016  . History of left breast cancer 03/26/2016  . Aortic heart valve narrowing 09/15/2015  . Paroxysmal supraventricular tachycardia ([place]) 08/07/2015  . Left wrist tendonitis 06/09/2015  . Plantar fasciitis, bilateral 11/29/2014  . Vitamin D deficiency 11/29/2014  . Dupuytren's contracture of foot 11/29/2014  . Dyslipidemia 11/28/2014  . Environmental and seasonal allergies 11/28/2014  . Raynaud's syndrome without gangrene 11/28/2014  . Carpal tunnel syndrome 11/28/2014  . Benign colonic polyp 11/28/2014  . Diverticulosis of colon without diverticulitis 11/28/2014  . Essential hypertension 11/28/2014  . Asymptomatic varicose veins 11/28/2014  . Raynaud's phenomenon 11/28/2014    Past Surgical History:  Procedure Laterality Date  . BREAST BIOPSY Left 02/24/2016   path pending  . CHOLECYSTECTOMY  2012  . COLONOSCOPY WITH PROPOFOL N/A 10/13/2015   Procedure: COLONOSCOPY WITH PROPOFOL;  Surgeon: Hulen Luster, MD;  Location: [facility] ENDOSCOPY;   Service: Gastroenterology;  Laterality: N/A;  . ESOPHAGEAL DILATION  07/2009  . THROAT SURGERY  2011   polyp removed from vocal cord  . TUBAL LIGATION  1991    OB History   No obstetric history on file.      Home Medications    Prior to Admission medications   Medication Sig Start Date End Date Taking? Authorizing Provider  acetaminophen (TYLENOL) 500 MG tablet Take 1 tablet by mouth once as needed.   Yes [provider]  azelastine (ASTELIN) 0.1 % nasal spray Place 2 sprays into both nostrils 2 (two) times daily. Use in each nostril as directed 06/22/18  Yes Glean Hess, MD  Calcium-Magnesium-Vitamin D (CALCIUM MAGNESIUM PO) Take by mouth.   Yes [provider]  cetirizine (ZYRTEC) 10 MG tablet TAKE (1) TABLET BY MOUTH EVERY DAY 06/17/15  Yes Glean Hess, MD  fluticasone [facility]) 50 MCG/ACT nasal spray Place 2 sprays into both nostrils daily. 06/22/18  Yes Glean Hess, MD  lisinopril-hydrochlorothiazide (PRINZIDE,ZESTORETIC) 20-25 MG tablet Take 1 tablet by mouth daily. 06/22/18  Yes Glean Hess, MD  omeprazole (PRILOSEC) 20 MG capsule Take 1 capsule (20 mg total) by mouth daily. 06/22/18  Yes Glean Hess, MD  Probiotic Product (PROBIOTIC ADVANCED PO) Take by mouth.   Yes [provider]  vitamin C (ASCORBIC ACID) 500 MG tablet Take 500 mg by mouth 2 (two) times daily.   Yes [provider]  ondansetron (ZOFRAN ODT) 8 MG disintegrating tablet Take 1 tablet (8 mg total) by mouth every 8 (eight) hours  as needed. 06/28/18   Norval Gable, MD  oseltamivir (TAMIFLU) 75 MG capsule Take 1 capsule (75 mg total) by mouth 2 (two) times daily. 06/28/18   Norval Gable, MD    Family History Family History  Problem Relation Age of Onset  . Hypertension Mother   . Migraines Mother   . Peptic Ulcer Disease Father   . Breast cancer Maternal Aunt 54    Social History Social History   Tobacco Use  . Smoking status: Never Smoker  .  Smokeless tobacco: Never Used  Substance Use Topics  . Alcohol use: Yes    Alcohol/week: 0.0 standard drinks    Comment: social  . Drug use: No     Allergies   Codeine sulfate; Gluten meal; Shellfish-derived products; Sulfa antibiotics; and Brassica oleracea italica   Review of Systems Review of Systems  Constitutional: Positive for fever.  HENT: Positive for congestion and rhinorrhea.   Respiratory: Positive for cough. Negative for wheezing.   Musculoskeletal: Positive for myalgias.     Physical Exam Triage Vital Signs ED Triage Vitals  Enc Vitals Group     BP 06/28/18 1617 116/75     Pulse Rate 06/28/18 1617 (!) 58     Resp 06/28/18 1617 20     Temp 06/28/18 1617 98.9 F (37.2 C)     Temp Source 06/28/18 1617 Oral     SpO2 06/28/18 1617 100 %     Weight 06/28/18 1615 180 lb (81.6 kg)     Height 06/28/18 1615 5\' 4"  (1.626 m)     Head Circumference --      Peak Flow --      Pain Score 06/28/18 1615 0     Pain Loc --      Pain Edu? --      Excl. in [place]? --    No data found.  Updated Vital Signs BP 116/75 (BP Location: Left Arm)   Pulse (!) 58   Temp 98.9 F (37.2 C) (Oral)   Resp 20   Ht 5\' 4"  (1.626 m)   Wt 81.6 kg   SpO2 100%   BMI 30.90 kg/m   Visual Acuity Right Eye Distance:   Left Eye Distance:   Bilateral Distance:    Right Eye Near:   Left Eye Near:    Bilateral Near:     Physical Exam Vitals signs and nursing note reviewed.  Constitutional:      General: She is not in acute distress.    Appearance: She is well-developed. She is not toxic-appearing or diaphoretic.  HENT:     Head: Normocephalic.     Right Ear: Tympanic membrane, ear canal and external ear normal.     Left Ear: Tympanic membrane, ear canal and external ear normal.     Nose: Nose normal.  Eyes:     General: No scleral icterus.       Right eye: No discharge.        Left eye: No discharge.  Neck:     Musculoskeletal: Normal range of motion and neck supple.      Thyroid: No thyromegaly.     Vascular: No JVD.     Trachea: No tracheal deviation.  Cardiovascular:     Rate and Rhythm: Normal rate and regular rhythm.     Pulses: Normal pulses.     Heart sounds: Normal heart sounds. No murmur.  Pulmonary:     Effort: Pulmonary effort is normal. No respiratory distress.  Breath sounds: Normal breath sounds. No stridor. No wheezing, rhonchi or rales.  Chest:     Chest wall: No tenderness.  Lymphadenopathy:     Cervical: No cervical adenopathy.  Skin:    Findings: No rash.  Neurological:     Mental Status: She is alert.     Deep Tendon Reflexes: Reflexes are normal and symmetric.      UC Treatments / Results  Labs (all labs ordered are listed, but only abnormal results are displayed) Labs Reviewed  RAPID INFLUENZA A&B ANTIGENS ([place]) - Abnormal; Notable for the following components:      Result Value   Influenza A ([facility]) POSITIVE (*)    All other components within normal limits    EKG None  Radiology No results found.  Procedures Procedures (including critical care time)  Medications Ordered in UC Medications - No data to display  Initial Impression / Assessment and Plan / UC Course  I have reviewed the triage vital signs and the nursing notes.  Pertinent labs & imaging results that were available during my care of the patient were reviewed by me and considered in my medical decision making (see chart for details).      Final Clinical Impressions(s) / UC Diagnoses   Final diagnoses:  Influenza A    ED Prescriptions    Medication Sig Dispense Auth. Provider   oseltamivir (TAMIFLU) 75 MG capsule Take 1 capsule (75 mg total) by mouth 2 (two) times daily. 10 capsule Norval Gable, MD   ondansetron (ZOFRAN ODT) 8 MG disintegrating tablet Take 1 tablet (8 mg total) by mouth every 8 (eight) hours as needed. 6 tablet Norval Gable, MD     1. Lab results and diagnosis reviewed with patient 2. rx as per orders above;  reviewed possible side effects, interactions, risks and benefits  3. Recommend supportive treatment with rest, fluids  4. Follow-up prn if symptoms worsen or don't improve    Controlled Substance Prescriptions [place] Controlled Substance Registry consulted? Not Applicable   Norval Gable, MD 06/28/18 [phone]

## 2018-06-28 NOTE — ED Triage Notes (Signed)
Patient c/o cough and congestion that started Sunday. Patient c/o generalized body aches, intermittent low grade fever that started Monday.

## 2018-12-26 ENCOUNTER — Ambulatory Visit: Payer: BC Managed Care – PPO | Admitting: Internal Medicine

## 2018-12-26 ENCOUNTER — Encounter: Payer: Self-pay | Admitting: Internal Medicine

## 2018-12-26 ENCOUNTER — Other Ambulatory Visit: Payer: Self-pay

## 2018-12-26 VITALS — BP 114/70 | HR 52 | Ht 64.0 in | Wt 173.0 lb

## 2018-12-26 DIAGNOSIS — I10 Essential (primary) hypertension: Secondary | ICD-10-CM | POA: Diagnosis not present

## 2018-12-26 DIAGNOSIS — Z23 Encounter for immunization: Secondary | ICD-10-CM

## 2018-12-26 DIAGNOSIS — I471 Supraventricular tachycardia, unspecified: Secondary | ICD-10-CM

## 2018-12-26 DIAGNOSIS — C50412 Malignant neoplasm of upper-outer quadrant of left female breast: Secondary | ICD-10-CM

## 2018-12-26 DIAGNOSIS — E785 Hyperlipidemia, unspecified: Secondary | ICD-10-CM

## 2018-12-26 DIAGNOSIS — I83813 Varicose veins of bilateral lower extremities with pain: Secondary | ICD-10-CM

## 2018-12-26 DIAGNOSIS — Z853 Personal history of malignant neoplasm of breast: Secondary | ICD-10-CM | POA: Insufficient documentation

## 2018-12-26 DIAGNOSIS — Z170 Estrogen receptor positive status [ER+]: Secondary | ICD-10-CM

## 2018-12-26 MED ORDER — LISINOPRIL 20 MG PO TABS: 20.0000 mg | ORAL_TABLET | Freq: Every day | ORAL | 5 refills | Status: DC

## 2018-12-26 NOTE — Progress Notes (Signed)
Date:  12/26/2018   Name:  Holly Villegas   DOB:  08-08-1960   MRN:  [id number]   Chief Complaint: Depression and Hypertension Pt is interested in Shingrix vaccines. Hypertension This is a chronic problem. The problem has been gradually improving since onset. The problem is controlled. Associated symptoms include palpitations. Pertinent negatives include no chest pain, headaches or shortness of breath. There are no associated agents to hypertension. Past treatments include ACE inhibitors and diuretics. The current treatment provides significant (most readings are below 076 systolic) improvement. There are no compliance problems.  There is no history of kidney disease, CAD/MI or PVD.  Hyperlipidemia This is a chronic problem. Recent lipid tests were reviewed and are high. Associated symptoms include leg pain and myalgias (leg pain L>R). Pertinent negatives include no chest pain or shortness of breath. Treatments tried: previously on lipitor - trying to control with diet and weight loss.  Leg Pain  There was no injury mechanism. The pain is present in the right leg and left leg. The pain is moderate. The pain has been fluctuating since onset. Exacerbated by: Varicose veins. She has tried NSAIDs and rest for the symptoms. The treatment provided mild relief.  Hx breast cancer - followed by [facility] oncology for mammograms, scans, etc. SVT - none recurrent.  Pt feels that most of that was stress related to her husbands illness.  Since his passing, she has not had any further problems.  Lab Results  Component Value Date   CHOL 278 (H) 06/22/2018   HDL 77 06/22/2018   LDLCALC 158 (H) 06/22/2018   TRIG 213 (H) 06/22/2018   CHOLHDL 3.6 06/22/2018     Review of Systems  Constitutional: Negative for chills, fatigue, fever and unexpected weight change (she has lost 10 lbs).  HENT: Negative for trouble swallowing.   Respiratory: Negative for chest tightness, shortness of breath and wheezing.    Cardiovascular: Positive for palpitations. Negative for chest pain.  Gastrointestinal: Negative for abdominal pain, constipation and diarrhea.  Musculoskeletal: Positive for myalgias (leg pain L>R).  Allergic/Immunologic: Negative for environmental allergies.  Neurological: Negative for dizziness and headaches.  Hematological: Negative for adenopathy.  Psychiatric/Behavioral: Negative for dysphoric mood. The patient is not nervous/anxious.     Patient Active Problem List   Diagnosis Date Noted  . Lymphedema 02/03/2017  . Lumbar degenerative disc disease 06/16/2016  . Bilateral sciatica 06/11/2016  . History of left breast cancer 03/26/2016  . Aortic heart valve narrowing 09/15/2015  . Paroxysmal supraventricular tachycardia ([place]) 08/07/2015  . Left wrist tendonitis 06/09/2015  . Plantar fasciitis, bilateral 11/29/2014  . Vitamin D deficiency 11/29/2014  . Dupuytren's contracture of foot 11/29/2014  . Dyslipidemia 11/28/2014  . Environmental and seasonal allergies 11/28/2014  . Raynaud's syndrome without gangrene 11/28/2014  . Carpal tunnel syndrome 11/28/2014  . Benign colonic polyp 11/28/2014  . Diverticulosis of colon without diverticulitis 11/28/2014  . Essential hypertension 11/28/2014  . Asymptomatic varicose veins 11/28/2014  . Raynaud's phenomenon 11/28/2014    Allergies  Allergen Reactions  . Codeine Sulfate Nausea And Vomiting  . Gluten Meal   . Shellfish-Derived Products   . Sulfa Antibiotics Nausea And Vomiting  . Brassica Oleracea Italica Nausea And Vomiting and Other (See Comments)    Does not digest and vomits up    Past Surgical History:  Procedure Laterality Date  . BREAST BIOPSY Left 02/24/2016   path pending  . CHOLECYSTECTOMY  2012  . COLONOSCOPY WITH PROPOFOL N/A 10/13/2015   Procedure:  COLONOSCOPY WITH PROPOFOL;  Surgeon: Hulen Luster, MD;  Location: [facility] ENDOSCOPY;  Service: Gastroenterology;  Laterality: N/A;  . ESOPHAGEAL DILATION  07/2009  .  THROAT SURGERY  2011   polyp removed from vocal cord  . TUBAL LIGATION  1991    Social History   Tobacco Use  . Smoking status: Never Smoker  . Smokeless tobacco: Never Used  Substance Use Topics  . Alcohol use: Yes    Alcohol/week: 0.0 standard drinks    Comment: social  . Drug use: No     Medication list has been reviewed and updated.  Current Meds  Medication Sig  . acetaminophen (TYLENOL) 500 MG tablet Take 1 tablet by mouth once as needed.  Marland Kitchen azelastine (ASTELIN) 0.1 % nasal spray Place 2 sprays into both nostrils 2 (two) times daily. Use in each nostril as directed  . Calcium-Magnesium-Vitamin D (CALCIUM MAGNESIUM PO) Take by mouth.  . cetirizine (ZYRTEC) 10 MG tablet TAKE (1) TABLET BY MOUTH EVERY DAY  . fluticasone (FLONASE) 50 MCG/ACT nasal spray Place 2 sprays into both nostrils daily.  Marland Kitchen lisinopril-hydrochlorothiazide (PRINZIDE,ZESTORETIC) 20-25 MG tablet Take 1 tablet by mouth daily.  Marland Kitchen omeprazole (PRILOSEC) 20 MG capsule Take 1 capsule (20 mg total) by mouth daily.  . ondansetron (ZOFRAN ODT) 8 MG disintegrating tablet Take 1 tablet (8 mg total) by mouth every 8 (eight) hours as needed.  . Probiotic Product (PROBIOTIC ADVANCED PO) Take by mouth.  . vitamin C (ASCORBIC ACID) 500 MG tablet Take 500 mg by mouth 2 (two) times daily.    PHQ 2/9 Scores 12/26/2018 06/22/2018 05/02/2017  PHQ - 2 Score 0 1 0  PHQ- 9 Score 1 - -   GAD 7 : Generalized Anxiety Score 12/26/2018  Nervous, Anxious, on Edge 0  Control/stop worrying 0  Worry too much - different things 0  Trouble relaxing 0  Restless 0  Easily annoyed or irritable 0  Afraid - awful might happen 0  Total GAD 7 Score 0  Anxiety Difficulty Not difficult at all      BP Readings from Last 3 Encounters:  12/26/18 114/70  06/28/18 116/75  06/22/18 118/78    Physical Exam Vitals signs and nursing note reviewed.  Constitutional:      General: She is not in acute distress.    Appearance: She is  well-developed.  HENT:     Head: Normocephalic and atraumatic.  Neck:     Musculoskeletal: Normal range of motion and neck supple.     Vascular: No carotid bruit.  Cardiovascular:     Rate and Rhythm: Normal rate and regular rhythm.     Pulses: Normal pulses.     Heart sounds: No murmur.  Pulmonary:     Effort: Pulmonary effort is normal. No respiratory distress.     Breath sounds: No wheezing or rhonchi.  Musculoskeletal: Normal range of motion.     Right lower leg: No edema.     Left lower leg: No edema.     Comments: Large varicose veins from left thigh down to the ankle, tender but not inflamed  Lymphadenopathy:     Cervical: No cervical adenopathy.  Skin:    General: Skin is warm and dry.     Capillary Refill: Capillary refill takes less than 2 seconds.     Findings: No rash.  Neurological:     Mental Status: She is alert and oriented to person, place, and time.  Psychiatric:  Attention and Perception: Attention normal.        Mood and Affect: Mood normal.        Behavior: Behavior normal.        Thought Content: Thought content normal.     Wt Readings from Last 3 Encounters:  12/26/18 173 lb (78.5 kg)  06/28/18 180 lb (81.6 kg)  06/22/18 183 lb (83 kg)    BP 114/70   Pulse (!) 52   Ht 5\' 4"  (1.626 m)   Wt 173 lb (78.5 kg)   SpO2 99%   BMI 29.70 kg/m   Assessment and Plan: 1. Dyslipidemia Continue diet, exercise and weight loss before starting medication again Doing well with a 10 lb loss since last visit - Lipid panel  2. Essential hypertension Change from lisinopril hct to lisinopril Check BP several times per week - Basic metabolic panel - lisinopril (ZESTRIL) 20 MG tablet; Take 1 tablet (20 mg total) by mouth daily.  Dispense: 30 tablet; Refill: 5  3. Varicose veins of both lower extremities with pain Consider consultation with Vasc surgery  4. Paroxysmal supraventricular tachycardia (HCC) Not recurrent  5. Malignant neoplasm of  upper-outer quadrant of left breast in female, estrogen receptor positive ([place]) Followed by [facility] oncology  6. Need for shingles vaccine First dose today - Varicella-zoster vaccine IM   Partially dictated using Editor, commissioning. Any errors are unintentional.  Halina Maidens, MD [facility]  12/26/2018

## 2018-12-27 LAB — LIPID PANEL
Chol/HDL Ratio: 4.1 ratio (ref 0.0–4.4)
Cholesterol, Total: 256 mg/dL — ABNORMAL HIGH (ref 100–199)
HDL: 63 mg/dL (ref >39)
LDL Calculated: 158 mg/dL — ABNORMAL HIGH (ref 0–99)
Triglycerides: 177 mg/dL — ABNORMAL HIGH (ref 0–149)
VLDL Cholesterol Cal: 35 mg/dL (ref 5–40)

## 2018-12-27 LAB — BASIC METABOLIC PANEL
BUN/Creatinine Ratio: 18 (ref 12–28)
BUN: 13 mg/dL (ref 8–27)
CO2: 24 mmol/L (ref 20–29)
Calcium: 9.3 mg/dL (ref 8.7–10.3)
Chloride: 95 mmol/L — ABNORMAL LOW (ref 96–106)
Creatinine, Ser: 0.73 mg/dL (ref 0.57–1.00)
GFR calc Af Amer: 104 mL/min/{1.73_m2} (ref >59)
GFR calc non Af Amer: 90 mL/min/{1.73_m2} (ref >59)
Glucose: 101 mg/dL — ABNORMAL HIGH (ref 65–99)
Potassium: 4.6 mmol/L (ref 3.5–5.2)
Sodium: 132 mmol/L — ABNORMAL LOW (ref 134–144)

## 2019-01-05 ENCOUNTER — Other Ambulatory Visit: Payer: Self-pay

## 2019-01-05 ENCOUNTER — Ambulatory Visit
Admission: EM | Admit: 2019-01-05 | Discharge: 2019-01-05 | Disposition: A | Discharge status: Home / Self Care | Payer: BC Managed Care – PPO | Attending: Family Medicine | Admitting: Family Medicine

## 2019-01-05 DIAGNOSIS — R350 Frequency of micturition: Secondary | ICD-10-CM

## 2019-01-05 LAB — URINALYSIS, COMPLETE (UACMP) WITH MICROSCOPIC
Bacteria, UA: NONE SEEN
RBC / HPF: NONE SEEN RBC/hpf (ref 0–5)
WBC, UA: NONE SEEN WBC/hpf (ref 0–5)

## 2019-01-05 NOTE — ED Triage Notes (Signed)
Patient complains of urinary frequency, urgency and painful urination that started on Wednesday. Patient did take AZO.

## 2019-01-05 NOTE — ED Provider Notes (Signed)
[facility]    CSN: [id number] Arrival date & time: 01/05/19  1624    History   Chief Complaint Chief Complaint  Patient presents with  . Urinary Frequency    HPI  58 year old female presents with urinary frequency.  Patient reports that her symptoms started on Wednesday.  She reports urinary frequency and urgency.  She denies dysuria at this time.  Patient reports that she has had some suprapubic discomfort.  She has taken Azo without resolution.  She is concerned that she has a UTI.  No known inciting factor.  No known exacerbating or relieving factors.  No other associated symptoms.  No other complaints.  PMH, Surgical Hx, Family Hx, Social History reviewed and updated as below.  Past Medical History:  Diagnosis Date  . Cancer ([place])   . Diverticulosis   . GERD (gastroesophageal reflux disease)   . Hypercholesteremia   . Hypertension     Patient Active Problem List   Diagnosis Date Noted  . Malignant neoplasm of upper-outer quadrant of left breast in female, estrogen receptor positive ([place]) 12/26/2018  . Lymphedema 02/03/2017  . Lumbar degenerative disc disease 06/16/2016  . Bilateral sciatica 06/11/2016  . History of left breast cancer 03/26/2016  . Aortic heart valve narrowing 09/15/2015  . Paroxysmal supraventricular tachycardia ([place]) 08/07/2015  . Left wrist tendonitis 06/09/2015  . Plantar fasciitis, bilateral 11/29/2014  . Vitamin D deficiency 11/29/2014  . Dupuytren's contracture of foot 11/29/2014  . Dyslipidemia 11/28/2014  . Environmental and seasonal allergies 11/28/2014  . Raynaud's syndrome without gangrene 11/28/2014  . Carpal tunnel syndrome 11/28/2014  . Benign colonic polyp 11/28/2014  . Diverticulosis of colon without diverticulitis 11/28/2014  . Essential hypertension 11/28/2014  . Varicose veins of both lower extremities with pain 11/28/2014  . Raynaud's phenomenon 11/28/2014    Past Surgical History:  Procedure Laterality  Date  . BREAST BIOPSY Left 02/24/2016   path pending  . CHOLECYSTECTOMY  2012  . COLONOSCOPY WITH PROPOFOL N/A 10/13/2015   Procedure: COLONOSCOPY WITH PROPOFOL;  Surgeon: Hulen Luster, MD;  Location: [facility] ENDOSCOPY;  Service: Gastroenterology;  Laterality: N/A;  . ESOPHAGEAL DILATION  07/2009  . THROAT SURGERY  2011   polyp removed from vocal cord  . TUBAL LIGATION  1991    OB History   No obstetric history on file.      Home Medications    Prior to Admission medications   Medication Sig Start Date End Date Taking? Authorizing Provider  acetaminophen (TYLENOL) 500 MG tablet Take 1 tablet by mouth once as needed.   Yes [provider]  azelastine (ASTELIN) 0.1 % nasal spray Place 2 sprays into both nostrils 2 (two) times daily. Use in each nostril as directed 06/22/18  Yes Glean Hess, MD  Calcium-Magnesium-Vitamin D (CALCIUM MAGNESIUM PO) Take by mouth.   Yes [provider]  cetirizine (ZYRTEC) 10 MG tablet TAKE (1) TABLET BY MOUTH EVERY DAY 06/17/15  Yes Glean Hess, MD  fluticasone [facility]) 50 MCG/ACT nasal spray Place 2 sprays into both nostrils daily. 06/22/18  Yes Glean Hess, MD  lisinopril (ZESTRIL) 20 MG tablet Take 1 tablet (20 mg total) by mouth daily. 12/26/18  Yes Glean Hess, MD  omeprazole (PRILOSEC) 20 MG capsule Take 1 capsule (20 mg total) by mouth daily. 06/22/18  Yes Glean Hess, MD  Probiotic Product (PROBIOTIC ADVANCED PO) Take by mouth.   Yes [provider]  vitamin C (ASCORBIC ACID) 500 MG tablet Take  500 mg by mouth 2 (two) times daily.   Yes [provider]    Family History Family History  Problem Relation Age of Onset  . Hypertension Mother   . Migraines Mother   . Peptic Ulcer Disease Father   . Breast cancer Maternal Aunt 39    Social History Social History   Tobacco Use  . Smoking status: Never Smoker  . Smokeless tobacco: Never Used  Substance Use Topics  . Alcohol use: Yes     Alcohol/week: 0.0 standard drinks    Comment: social  . Drug use: No     Allergies   Codeine sulfate, Gluten meal, Shellfish-derived products, Sulfa antibiotics, and Brassica oleracea italica   Review of Systems Review of Systems  Constitutional: Negative.   Genitourinary: Positive for frequency and urgency.   Physical Exam Triage Vital Signs ED Triage Vitals  Enc Vitals Group     BP 01/05/19 1647 (!) 122/91     Pulse Rate 01/05/19 1647 (!) 50     Resp 01/05/19 1647 18     Temp 01/05/19 1647 97.6 F (36.4 C)     Temp Source 01/05/19 1647 Oral     SpO2 01/05/19 1647 100 %     Weight 01/05/19 1645 172 lb (78 kg)     Height 01/05/19 1645 5' 4.5" (1.638 m)     Head Circumference --      Peak Flow --      Pain Score 01/05/19 1645 4     Pain Loc --      Pain Edu? --      Excl. in [place]? --   Updated Vital Signs BP (!) 122/91 (BP Location: Right Arm)   Pulse (!) 50   Temp 97.6 F (36.4 C) (Oral)   Resp 18   Ht 5' 4.5" (1.638 m)   Wt 78 kg   SpO2 100%   BMI 29.07 kg/m   Visual Acuity Right Eye Distance:   Left Eye Distance:   Bilateral Distance:    Right Eye Near:   Left Eye Near:    Bilateral Near:     Physical Exam Vitals signs and nursing note reviewed.  Constitutional:      General: She is not in acute distress.    Appearance: Normal appearance.  HENT:     Head: Normocephalic and atraumatic.  Eyes:     General:        Right eye: No discharge.        Left eye: No discharge.     Conjunctiva/sclera: Conjunctivae normal.  Cardiovascular:     Rate and Rhythm: Normal rate and regular rhythm.  Pulmonary:     Effort: Pulmonary effort is normal.     Breath sounds: Normal breath sounds.  Abdominal:     General: There is no distension.     Palpations: Abdomen is soft.     Tenderness: There is no abdominal tenderness.  Neurological:     Mental Status: She is alert.  Psychiatric:        Mood and Affect: Mood normal.        Behavior: Behavior normal.     UC Treatments / Results  Labs (all labs ordered are listed, but only abnormal results are displayed) Labs Reviewed  URINALYSIS, COMPLETE (UACMP) WITH MICROSCOPIC - Abnormal; Notable for the following components:      Result Value   Color, Urine ORANGE (*)    Glucose, UA   (*)    Value: TEST  NOT REPORTED DUE TO COLOR INTERFERENCE OF URINE PIGMENT   Hgb urine dipstick   (*)    Value: TEST NOT REPORTED DUE TO COLOR INTERFERENCE OF URINE PIGMENT   Bilirubin Urine   (*)    Value: TEST NOT REPORTED DUE TO COLOR INTERFERENCE OF URINE PIGMENT   Ketones, ur   (*)    Value: TEST NOT REPORTED DUE TO COLOR INTERFERENCE OF URINE PIGMENT   Protein, ur   (*)    Value: TEST NOT REPORTED DUE TO COLOR INTERFERENCE OF URINE PIGMENT   Nitrite   (*)    Value: TEST NOT REPORTED DUE TO COLOR INTERFERENCE OF URINE PIGMENT   Leukocytes,Ua   (*)    Value: TEST NOT REPORTED DUE TO COLOR INTERFERENCE OF URINE PIGMENT   All other components within normal limits  URINE CULTURE    EKG   Radiology No results found.  Procedures Procedures (including critical care time)  Medications Ordered in UC Medications - No data to display  Initial Impression / Assessment and Plan / UC Course  I have reviewed the triage vital signs and the nursing notes.  Pertinent labs & imaging results that were available during my care of the patient were reviewed by me and considered in my medical decision making (see chart for details).    58 year old female presents with urinary frequency.  Urine microscopy not consistent with UTI.  There was no pyuria or hematuria.  No bacteria.  Push fluids.  Sending culture.  Supportive care.  Final Clinical Impressions(s) / UC Diagnoses   Final diagnoses:  Urinary frequency     Discharge Instructions     No evidence of UTI.  Follow up with your PCP.  Take care  Dr. Lacinda Axon    ED Prescriptions    None     Controlled Substance Prescriptions [place] Controlled Substance  Registry consulted? Not Applicable   Coral Spikes, DO 01/05/19 1704

## 2019-01-05 NOTE — Discharge Instructions (Signed)
No evidence of UTI.  Follow up with your PCP.  Take care  Dr. Lacinda Axon

## 2019-01-06 LAB — URINE CULTURE: Culture: NO GROWTH

## 2019-01-23 ENCOUNTER — Other Ambulatory Visit: Payer: Self-pay | Admitting: Gastroenterology

## 2019-01-23 DIAGNOSIS — R1032 Left lower quadrant pain: Secondary | ICD-10-CM

## 2019-01-23 DIAGNOSIS — R140 Abdominal distension (gaseous): Secondary | ICD-10-CM

## 2019-01-24 ENCOUNTER — Other Ambulatory Visit
Admission: RE | Admit: 2019-01-24 | Discharge: 2019-01-24 | Disposition: A | Discharge status: Home / Self Care | Payer: BC Managed Care – PPO | Source: Ambulatory Visit | Attending: Gastroenterology | Admitting: Gastroenterology

## 2019-01-24 DIAGNOSIS — R197 Diarrhea, unspecified: Secondary | ICD-10-CM | POA: Insufficient documentation

## 2019-01-24 LAB — GASTROINTESTINAL PANEL BY PCR, STOOL (REPLACES STOOL CULTURE)

## 2019-01-24 LAB — C DIFFICILE QUICK SCREEN W PCR REFLEX
C Diff antigen: NEGATIVE
C Diff interpretation: NOT DETECTED
C Diff toxin: NEGATIVE

## 2019-01-29 ENCOUNTER — Ambulatory Visit
Admission: RE | Admit: 2019-01-29 | Discharge: 2019-01-29 | Disposition: A | Discharge status: Home / Self Care | Payer: BC Managed Care – PPO | Source: Ambulatory Visit | Attending: Gastroenterology | Admitting: Gastroenterology

## 2019-01-29 ENCOUNTER — Other Ambulatory Visit: Payer: Self-pay

## 2019-01-29 DIAGNOSIS — R140 Abdominal distension (gaseous): Secondary | ICD-10-CM | POA: Insufficient documentation

## 2019-01-29 DIAGNOSIS — R1032 Left lower quadrant pain: Secondary | ICD-10-CM | POA: Diagnosis present

## 2019-01-29 MED ORDER — IOHEXOL 300 MG/ML  SOLN
100.0000 mL | Freq: Once | INTRAMUSCULAR | Status: AC | PRN
  Administered 2019-01-29: 100 mL via INTRAVENOUS

## 2019-04-26 ENCOUNTER — Other Ambulatory Visit: Payer: Self-pay

## 2019-04-26 ENCOUNTER — Encounter: Payer: Self-pay | Admitting: Internal Medicine

## 2019-04-26 ENCOUNTER — Ambulatory Visit (INDEPENDENT_AMBULATORY_CARE_PROVIDER_SITE_OTHER): Payer: BC Managed Care – PPO | Admitting: Internal Medicine

## 2019-04-26 VITALS — BP 132/78 | HR 64 | Ht 64.5 in | Wt 181.0 lb

## 2019-04-26 DIAGNOSIS — K5730 Diverticulosis of large intestine without perforation or abscess without bleeding: Secondary | ICD-10-CM

## 2019-04-26 DIAGNOSIS — Z23 Encounter for immunization: Secondary | ICD-10-CM | POA: Diagnosis not present

## 2019-04-26 DIAGNOSIS — I10 Essential (primary) hypertension: Secondary | ICD-10-CM

## 2019-04-26 MED ORDER — LISINOPRIL 20 MG PO TABS: 20.0000 mg | ORAL_TABLET | Freq: Every day | ORAL | 5 refills | Status: DC

## 2019-04-26 NOTE — Progress Notes (Signed)
Date:  04/26/2019   Name:  Holly Villegas   DOB:  08-30-1960   MRN:  [id number]   Chief Complaint: Hypertension and Immunizations (Shingrix #2 )  Hypertension This is a chronic problem. The problem is controlled. Pertinent negatives include no chest pain, headaches, palpitations or shortness of breath. Past treatments include ACE inhibitors. The current treatment provides significant improvement.  Last visit lisinopril hct was changed to lisinopril alone due to low BP readings.  She has done well with the change.  Mild puffiness in hands at times, no significant pedal edema.  Review of Systems  Constitutional: Negative for chills, fatigue and fever.  Respiratory: Negative for chest tightness, shortness of breath and wheezing.   Cardiovascular: Negative for chest pain, palpitations and leg swelling.  Gastrointestinal: Negative for abdominal pain.  Neurological: Negative for dizziness and headaches.    Patient Active Problem List   Diagnosis Date Noted  . Malignant neoplasm of upper-outer quadrant of left breast in female, estrogen receptor positive ([place]) 12/26/2018  . Lymphedema 02/03/2017  . Lumbar degenerative disc disease 06/16/2016  . Bilateral sciatica 06/11/2016  . History of left breast cancer 03/26/2016  . Aortic heart valve narrowing 09/15/2015  . Paroxysmal supraventricular tachycardia ([place]) 08/07/2015  . Left wrist tendonitis 06/09/2015  . Plantar fasciitis, bilateral 11/29/2014  . Vitamin D deficiency 11/29/2014  . Dupuytren's contracture of foot 11/29/2014  . Dyslipidemia 11/28/2014  . Environmental and seasonal allergies 11/28/2014  . Raynaud's syndrome without gangrene 11/28/2014  . Carpal tunnel syndrome 11/28/2014  . Benign colonic polyp 11/28/2014  . Diverticulosis of colon without diverticulitis 11/28/2014  . Essential hypertension 11/28/2014  . Varicose veins of both lower extremities with pain 11/28/2014  . Raynaud's phenomenon 11/28/2014     Allergies  Allergen Reactions  . Codeine Sulfate Nausea And Vomiting  . Gluten Meal   . Shellfish-Derived Products   . Sulfa Antibiotics Nausea And Vomiting  . Brassica Oleracea Nausea And Vomiting and Other (See Comments)    Does not digest and vomits up    Past Surgical History:  Procedure Laterality Date  . BREAST BIOPSY Left 02/24/2016   path pending  . CHOLECYSTECTOMY  2012  . COLONOSCOPY WITH PROPOFOL N/A 10/13/2015   Procedure: COLONOSCOPY WITH PROPOFOL;  Surgeon: Hulen Luster, MD;  Location: [facility] ENDOSCOPY;  Service: Gastroenterology;  Laterality: N/A;  . ESOPHAGEAL DILATION  07/2009  . THROAT SURGERY  2011   polyp removed from vocal cord  . TUBAL LIGATION  1991    Social History   Tobacco Use  . Smoking status: Never Smoker  . Smokeless tobacco: Never Used  Substance Use Topics  . Alcohol use: Yes    Alcohol/week: 0.0 standard drinks    Comment: social  . Drug use: No     Medication list has been reviewed and updated.  Current Meds  Medication Sig  . acetaminophen (TYLENOL) 500 MG tablet Take 1 tablet by mouth once as needed.  Marland Kitchen azelastine (ASTELIN) 0.1 % nasal spray Place 2 sprays into both nostrils 2 (two) times daily. Use in each nostril as directed  . Calcium-Magnesium-Vitamin D (CALCIUM MAGNESIUM PO) Take by mouth.  . cetirizine (ZYRTEC) 10 MG tablet TAKE (1) TABLET BY MOUTH EVERY DAY  . fluticasone (FLONASE) 50 MCG/ACT nasal spray Place 2 sprays into both nostrils daily.  Marland Kitchen lisinopril (ZESTRIL) 20 MG tablet Take 1 tablet (20 mg total) by mouth daily.  Marland Kitchen omeprazole (PRILOSEC) 20 MG capsule Take 1 capsule (20 mg total)  by mouth daily.  . Probiotic Product (PROBIOTIC ADVANCED PO) Take by mouth.  . vitamin C (ASCORBIC ACID) 500 MG tablet Take 500 mg by mouth 2 (two) times daily.    PHQ 2/9 Scores 04/26/2019 12/26/2018 06/22/2018 05/02/2017  PHQ - 2 Score 0 0 1 0  PHQ- 9 Score - 1 - -    BP Readings from Last 3 Encounters:  04/26/19 132/78  01/05/19 (!)  122/91  12/26/18 114/70    Physical Exam Vitals signs and nursing note reviewed.  Constitutional:      General: She is not in acute distress.    Appearance: Normal appearance. She is well-developed.  HENT:     Head: Normocephalic and atraumatic.  Cardiovascular:     Rate and Rhythm: Normal rate and regular rhythm.     Pulses: Normal pulses.     Heart sounds: No murmur.  Pulmonary:     Effort: Pulmonary effort is normal. No respiratory distress.     Breath sounds: No wheezing or rhonchi.  Musculoskeletal:     Right lower leg: No edema.     Left lower leg: No edema.  Lymphadenopathy:     Cervical: No cervical adenopathy.  Skin:    General: Skin is warm and dry.     Capillary Refill: Capillary refill takes less than 2 seconds.     Findings: No rash.  Neurological:     General: No focal deficit present.     Mental Status: She is alert and oriented to person, place, and time.  Psychiatric:        Behavior: Behavior normal.        Thought Content: Thought content normal.     Wt Readings from Last 3 Encounters:  04/26/19 181 lb (82.1 kg)  01/05/19 172 lb (78 kg)  12/26/18 173 lb (78.5 kg)    BP 132/78   Pulse 64   Ht 5' 4.5" (1.638 m)   Wt 181 lb (82.1 kg)   SpO2 97%   BMI 30.59 kg/m   Assessment and Plan: 1. Essential hypertension Clinically stable exam with well controlled BP.   Tolerating medications, lisinopril 20 mg, without side effects at this time. Pt to continue current regimen and low sodium diet; benefits of regular exercise as able discussed. - lisinopril (ZESTRIL) 20 MG tablet; Take 1 tablet (20 mg total) by mouth daily.  Dispense: 30 tablet; Refill: 5  2. Diverticulosis of colon without diverticulitis Followed by GI Continue low residue diet, gluten free   Partially dictated using Editor, commissioning. Any errors are unintentional.  Halina Maidens, MD [facility]  04/26/2019

## 2019-06-18 LAB — HM MAMMOGRAPHY

## 2019-07-09 ENCOUNTER — Other Ambulatory Visit: Payer: Self-pay

## 2019-07-09 ENCOUNTER — Encounter: Payer: Self-pay | Admitting: Internal Medicine

## 2019-07-09 ENCOUNTER — Ambulatory Visit: Payer: BC Managed Care – PPO | Admitting: Internal Medicine

## 2019-07-09 VITALS — BP 112/74 | HR 60 | Temp 97.9°F | Ht 64.5 in | Wt 183.0 lb

## 2019-07-09 DIAGNOSIS — G43109 Migraine with aura, not intractable, without status migrainosus: Secondary | ICD-10-CM | POA: Insufficient documentation

## 2019-07-09 DIAGNOSIS — R300 Dysuria: Secondary | ICD-10-CM

## 2019-07-09 LAB — POC URINALYSIS WITH MICROSCOPIC (NON AUTO)MANUAL RESULT
Bilirubin, UA: UNDETERMINED
Crystals: 0
Epithelial cells, urine per micros: 0
Glucose, UA: NEGATIVE
Ketones, UA: UNDETERMINED
Leukocytes, UA: NEGATIVE
Mucus, UA: 0
Nitrite, UA: UNDETERMINED
Protein, UA: NEGATIVE
RBC: 100 M/uL — AB (ref 4.04–5.48)
Spec Grav, UA: >=1.03 — AB (ref 1.010–1.025)
Urobilinogen, UA: NEGATIVE E.U./dL — AB
WBC Casts, UA: 30
pH, UA: 5 (ref 5.0–8.0)

## 2019-07-09 MED ORDER — CIPROFLOXACIN HCL 250 MG PO TABS: 250.0000 mg | ORAL_TABLET | Freq: Two times a day (BID) | ORAL | 0 refills | Status: AC

## 2019-07-09 NOTE — Progress Notes (Signed)
Date:  07/09/2019   Name:  Holly Villegas   DOB:  January 18, 1961   MRN:  [id number]   Chief Complaint: Urinary Tract Infection (Started yesterday. Bleeding and clotting. Taking AZO- started lastnight at 9pm. Frequency and discomfort in pelvic area. ) and Migraine (Having migraine aura for about 4 days straight now. Wanted to discuss. Michela Pitcher this has been years since this has happened. )  Urinary Tract Infection  This is a new problem. The current episode started yesterday. The problem occurs every urination. The quality of the pain is described as burning. The pain is mild. There has been no fever. Associated symptoms include frequency, hematuria and urgency. Pertinent negatives include no chills.  Migraine  This is a recurrent problem. Episode frequency: has not had many in the past 10 or more years. Progression since onset: wonders if the migraine occured because of the UTI. The pain is located in the right unilateral region. The pain quality is similar to prior headaches. The pain is mild. Associated symptoms include a visual change (various visual auras). Pertinent negatives include no dizziness, fever or weakness. She has tried Excedrin and NSAIDs for the symptoms. The treatment provided moderate relief.    Lab Results  Component Value Date   CREATININE 0.73 12/26/2018   BUN 13 12/26/2018   NA 132 (L) 12/26/2018   K 4.6 12/26/2018   CL 95 (L) 12/26/2018   CO2 24 12/26/2018   Lab Results  Component Value Date   CHOL 256 (H) 12/26/2018   HDL 63 12/26/2018   LDLCALC 158 (H) 12/26/2018   TRIG 177 (H) 12/26/2018   CHOLHDL 4.1 12/26/2018   Lab Results  Component Value Date   TSH 2.180 06/22/2018   No results found for: HGBA1C   Review of Systems  Constitutional: Negative for chills, fatigue, fever and unexpected weight change.  Respiratory: Negative for chest tightness and shortness of breath.   Cardiovascular: Negative for chest pain and palpitations.  Genitourinary:  Positive for dysuria, frequency, hematuria and urgency.  Neurological: Positive for headaches. Negative for dizziness and weakness.    Patient Active Problem List   Diagnosis Date Noted  . Malignant neoplasm of upper-outer quadrant of left breast in female, estrogen receptor positive ([place]) 12/26/2018  . Lymphedema 02/03/2017  . Lumbar degenerative disc disease 06/16/2016  . Bilateral sciatica 06/11/2016  . History of left breast cancer 03/26/2016  . Aortic heart valve narrowing 09/15/2015  . Paroxysmal supraventricular tachycardia ([place]) 08/07/2015  . Left wrist tendonitis 06/09/2015  . Plantar fasciitis, bilateral 11/29/2014  . Vitamin D deficiency 11/29/2014  . Dupuytren's contracture of foot 11/29/2014  . Dyslipidemia 11/28/2014  . Environmental and seasonal allergies 11/28/2014  . Raynaud's syndrome without gangrene 11/28/2014  . Carpal tunnel syndrome 11/28/2014  . Benign colonic polyp 11/28/2014  . Diverticulosis of colon without diverticulitis 11/28/2014  . Essential hypertension 11/28/2014  . Varicose veins of both lower extremities with pain 11/28/2014  . Raynaud's phenomenon 11/28/2014    Allergies  Allergen Reactions  . Codeine Sulfate Nausea And Vomiting  . Gluten Meal   . Shellfish-Derived Products   . Sulfa Antibiotics Nausea And Vomiting  . Brassica Oleracea Nausea And Vomiting and Other (See Comments)    Does not digest and vomits up    Past Surgical History:  Procedure Laterality Date  . BREAST BIOPSY Left 02/24/2016   path pending  . CHOLECYSTECTOMY  2012  . COLONOSCOPY WITH PROPOFOL N/A 10/13/2015   Procedure: COLONOSCOPY WITH PROPOFOL;  Surgeon:  Hulen Luster, MD;  Location: [facility] ENDOSCOPY;  Service: Gastroenterology;  Laterality: N/A;  . ESOPHAGEAL DILATION  07/2009  . THROAT SURGERY  2011   polyp removed from vocal cord  . TUBAL LIGATION  1991    Social History   Tobacco Use  . Smoking status: Never Smoker  . Smokeless tobacco: Never Used    Substance Use Topics  . Alcohol use: Yes    Alcohol/week: 0.0 standard drinks    Comment: social  . Drug use: No     Medication list has been reviewed and updated.  Current Meds  Medication Sig  . acetaminophen (TYLENOL) 500 MG tablet Take 1 tablet by mouth once as needed.  Marland Kitchen azelastine (ASTELIN) 0.1 % nasal spray Place 2 sprays into both nostrils 2 (two) times daily. Use in each nostril as directed  . Calcium-Magnesium-Vitamin D (CALCIUM MAGNESIUM PO) Take by mouth.  . cetirizine (ZYRTEC) 10 MG tablet TAKE (1) TABLET BY MOUTH EVERY DAY  . fluticasone (FLONASE) 50 MCG/ACT nasal spray Place 2 sprays into both nostrils daily.  Marland Kitchen lisinopril (ZESTRIL) 20 MG tablet Take 1 tablet (20 mg total) by mouth daily.  Marland Kitchen omeprazole (PRILOSEC) 20 MG capsule Take 1 capsule (20 mg total) by mouth daily.  . Probiotic Product (PROBIOTIC ADVANCED PO) Take by mouth.  . vitamin C (ASCORBIC ACID) 500 MG tablet Take 500 mg by mouth 2 (two) times daily.    PHQ 2/9 Scores 07/09/2019 04/26/2019 12/26/2018 06/22/2018  PHQ - 2 Score 0 0 0 1  PHQ- 9 Score - - 1 -    BP Readings from Last 3 Encounters:  07/09/19 112/74  04/26/19 132/78  01/05/19 (!) 122/91    Physical Exam Constitutional:      Appearance: Normal appearance.  Cardiovascular:     Rate and Rhythm: Normal rate and regular rhythm.     Heart sounds: No murmur.  Pulmonary:     Effort: Pulmonary effort is normal.     Breath sounds: Normal breath sounds. No wheezing or rhonchi.  Abdominal:     General: Bowel sounds are normal.     Palpations: Abdomen is soft.     Tenderness: There is abdominal tenderness in the suprapubic area. There is no guarding or rebound.  Musculoskeletal:     Cervical back: Normal range of motion.  Lymphadenopathy:     Cervical: No cervical adenopathy.  Neurological:     General: No focal deficit present.     Mental Status: She is alert.  Psychiatric:        Attention and Perception: Attention normal.     Wt  Readings from Last 3 Encounters:  07/09/19 183 lb (83 kg)  04/26/19 181 lb (82.1 kg)  01/05/19 172 lb (78 kg)    BP 112/74   Pulse 60   Temp 97.9 F (36.6 C) (Oral)   Ht 5' 4.5" (1.638 m)   Wt 183 lb (83 kg)   SpO2 98%   BMI 30.93 kg/m   Assessment and Plan: 1. Dysuria With gross hematuria and large WBC and bacteria on microscopic exam Increase fluid intake Follow up if needed - POC urinalysis w microscopic (non auto) - ciprofloxacin (CIPRO) 250 MG tablet; Take 1 tablet (250 mg total) by mouth 2 (two) times daily for 7 days.  Dispense: 14 tablet; Refill: 0  2. Migraine with aura and without status migrainosus, not intractable Continue otc nsaids tid until headache resolves Follow up if headache is recurrent   Partially dictated using  Editor, commissioning. Any errors are unintentional.  Halina Maidens, MD [facility]  07/09/2019

## 2019-10-03 ENCOUNTER — Ambulatory Visit: Payer: BC Managed Care – PPO | Admitting: Internal Medicine

## 2019-10-03 ENCOUNTER — Ambulatory Visit
Admission: EM | Admit: 2019-10-03 | Discharge: 2019-10-03 | Disposition: A | Discharge status: Home / Self Care | Payer: BC Managed Care – PPO | Attending: Family Medicine | Admitting: Family Medicine

## 2019-10-03 ENCOUNTER — Other Ambulatory Visit: Payer: Self-pay

## 2019-10-03 DIAGNOSIS — W57XXXA Bitten or stung by nonvenomous insect and other nonvenomous arthropods, initial encounter: Secondary | ICD-10-CM | POA: Diagnosis not present

## 2019-10-03 DIAGNOSIS — R21 Rash and other nonspecific skin eruption: Secondary | ICD-10-CM | POA: Diagnosis not present

## 2019-10-03 MED ORDER — DOXYCYCLINE HYCLATE 100 MG PO CAPS: 100.0000 mg | ORAL_CAPSULE | Freq: Two times a day (BID) | ORAL | 0 refills | Status: AC

## 2019-10-03 NOTE — ED Provider Notes (Signed)
[facility]    CSN: [id number] Arrival date & time: 10/03/19  1231  History   Chief Complaint Chief Complaint  Patient presents with  . Tick Bite  . Rash   HPI 59 year old female presents with the above complaints.  Patient reports that she was bitten by tick on Wednesday of last week.  This occurred while she was in [place].  She was unable to remove it fully and subsequently was seen by urgent care.  She was advised to follow-up.  Patient states that she has had ongoing rash of her lower extremities and upper extremities.  She also reports fatigue.  Denies joint pain.  Denies fever.  She is concerned about the possibility of tickborne illness.  No relieving factors.  No other associated symptoms.  No other complaints.  Past Medical History:  Diagnosis Date  . Cancer ([place])    left sided breast ca  . Diverticulosis   . GERD (gastroesophageal reflux disease)   . Hypercholesteremia   . Hypertension     Patient Active Problem List   Diagnosis Date Noted  . Migraine with aura and without status migrainosus, not intractable 07/09/2019  . Malignant neoplasm of upper-outer quadrant of left breast in female, estrogen receptor positive ([place]) 12/26/2018  . Lymphedema 02/03/2017  . Lumbar degenerative disc disease 06/16/2016  . Bilateral sciatica 06/11/2016  . History of left breast cancer 03/26/2016  . Aortic heart valve narrowing 09/15/2015  . Paroxysmal supraventricular tachycardia ([place]) 08/07/2015  . Left wrist tendonitis 06/09/2015  . Plantar fasciitis, bilateral 11/29/2014  . Vitamin D deficiency 11/29/2014  . Dupuytren's contracture of foot 11/29/2014  . Dyslipidemia 11/28/2014  . Environmental and seasonal allergies 11/28/2014  . Raynaud's syndrome without gangrene 11/28/2014  . Carpal tunnel syndrome 11/28/2014  . Benign colonic polyp 11/28/2014  . Diverticulosis of colon without diverticulitis 11/28/2014  . Essential hypertension 11/28/2014  . Varicose  veins of both lower extremities with pain 11/28/2014  . Raynaud's phenomenon 11/28/2014    Past Surgical History:  Procedure Laterality Date  . BREAST BIOPSY Left 02/24/2016   path pending  . CHOLECYSTECTOMY  2012  . COLONOSCOPY WITH PROPOFOL N/A 10/13/2015   Procedure: COLONOSCOPY WITH PROPOFOL;  Surgeon: Hulen Luster, MD;  Location: [facility] ENDOSCOPY;  Service: Gastroenterology;  Laterality: N/A;  . ESOPHAGEAL DILATION  07/2009  . THROAT SURGERY  2011   polyp removed from vocal cord  . TUBAL LIGATION  1991    OB History   No obstetric history on file.      Home Medications    Prior to Admission medications   Medication Sig Start Date End Date Taking? Authorizing Provider  acetaminophen (TYLENOL) 500 MG tablet Take 1 tablet by mouth once as needed.   Yes [provider]  azelastine (ASTELIN) 0.1 % nasal spray Place 2 sprays into both nostrils 2 (two) times daily. Use in each nostril as directed 06/22/18  Yes Glean Hess, MD  Calcium-Magnesium-Vitamin D (CALCIUM MAGNESIUM PO) Take by mouth.   Yes [provider]  cetirizine (ZYRTEC) 10 MG tablet TAKE (1) TABLET BY MOUTH EVERY DAY 06/17/15  Yes Glean Hess, MD  fluticasone [facility]) 50 MCG/ACT nasal spray Place 2 sprays into both nostrils daily. 06/22/18  Yes Glean Hess, MD  lisinopril (ZESTRIL) 20 MG tablet Take 1 tablet (20 mg total) by mouth daily. 04/26/19  Yes Glean Hess, MD  omeprazole (PRILOSEC) 20 MG capsule Take 1 capsule (20 mg total) by mouth daily. 06/22/18  Yes Glean Hess, MD  Probiotic Product (PROBIOTIC ADVANCED PO) Take by mouth.   Yes [provider]  vitamin C (ASCORBIC ACID) 500 MG tablet Take 500 mg by mouth 2 (two) times daily.   Yes [provider]  doxycycline (VIBRAMYCIN) 100 MG capsule Take 1 capsule (100 mg total) by mouth 2 (two) times daily for 14 days. 10/03/19 10/17/19  Coral Spikes, DO    Family History Family History  Problem Relation Age of  Onset  . Hypertension Mother   . Migraines Mother   . Peptic Ulcer Disease Father   . Breast cancer Maternal Aunt 32    Social History Social History   Tobacco Use  . Smoking status: Never Smoker  . Smokeless tobacco: Never Used  Substance Use Topics  . Alcohol use: Yes    Alcohol/week: 0.0 standard drinks    Comment: social  . Drug use: No     Allergies   Codeine sulfate, Gluten meal, Shellfish-derived products, Sulfa antibiotics, and Brassica oleracea   Review of Systems Review of Systems  Constitutional: Negative.   Skin: Positive for rash.   Physical Exam Triage Vital Signs ED Triage Vitals  Enc Vitals Group     BP 10/03/19 1301 132/76     Pulse Rate 10/03/19 1301 (!) 52     Resp 10/03/19 1301 18     Temp 10/03/19 1301 98.2 F (36.8 C)     Temp Source 10/03/19 1301 Oral     SpO2 10/03/19 1301 100 %     Weight 10/03/19 1300 173 lb (78.5 kg)     Height 10/03/19 1300 5\' 5"  (1.651 m)     Head Circumference --      Peak Flow --      Pain Score 10/03/19 1259 0     Pain Loc --      Pain Edu? --      Excl. in [place]? --    Updated Vital Signs BP 132/76 (BP Location: Right Arm)   Pulse (!) 52   Temp 98.2 F (36.8 C) (Oral)   Resp 18   Ht 5\' 5"  (1.651 m)   Wt 78.5 kg   SpO2 100%   BMI 28.79 kg/m   Visual Acuity Right Eye Distance:   Left Eye Distance:   Bilateral Distance:    Right Eye Near:   Left Eye Near:    Bilateral Near:     Physical Exam Vitals and nursing note reviewed.  Constitutional:      General: She is not in acute distress.    Appearance: Normal appearance. She is not ill-appearing.  HENT:     Head: Normocephalic and atraumatic.  Eyes:     General:        Right eye: No discharge.        Left eye: No discharge.     Conjunctiva/sclera: Conjunctivae normal.  Cardiovascular:     Rate and Rhythm: Normal rate and regular rhythm.  Pulmonary:     Effort: Pulmonary effort is normal.     Breath sounds: No wheezing, rhonchi or rales.    Skin:    Comments:  Lower extremities and upper extremities with erythematous rash.  Neurological:     Mental Status: She is alert.  Psychiatric:        Mood and Affect: Mood normal.        Behavior: Behavior normal.    UC Treatments / Results  Labs (all labs ordered are listed, but only  abnormal results are displayed) Labs Reviewed - No data to display  EKG   Radiology No results found.  Procedures Procedures (including critical care time)  Medications Ordered in UC Medications - No data to display  Initial Impression / Assessment and Plan / UC Course  I have reviewed the triage vital signs and the nursing notes.  Pertinent labs & imaging results that were available during my care of the patient were reviewed by me and considered in my medical decision making (see chart for details).    59 year old female presents with a tick bite and subsequent rash and fatigue.  Tick bite occurred while she was in [place].  Endemic area.  Placing on empiric doxycycline.  Final Clinical Impressions(s) / UC Diagnoses   Final diagnoses:  Tick bite, initial encounter  Rash     Discharge Instructions     Antibiotic as prescribed.  Take care  Dr. Lacinda Axon    ED Prescriptions    Medication Sig Dispense Auth. Provider   doxycycline (VIBRAMYCIN) 100 MG capsule Take 1 capsule (100 mg total) by mouth 2 (two) times daily for 14 days. 28 capsule Thersa Salt G, DO     PDMP not reviewed this encounter.   Coral Spikes, [place] 10/03/19 1811

## 2019-10-03 NOTE — Discharge Instructions (Signed)
Antibiotic as prescribed.  Take care  Dr. Krystle Oberman  

## 2019-10-03 NOTE — ED Triage Notes (Signed)
Patient states that she got bit by a tick on her left leg. States that she was in [place] and saw an UC there and they removed it. Patient states that she was told to have tick labs drawn when she got back home. Patient states that she has been foggy and fatigued over the last few days.

## 2019-10-21 ENCOUNTER — Other Ambulatory Visit: Payer: Self-pay | Admitting: Internal Medicine

## 2019-10-24 ENCOUNTER — Encounter: Payer: Self-pay | Admitting: Internal Medicine

## 2019-10-24 ENCOUNTER — Other Ambulatory Visit: Payer: Self-pay

## 2019-10-24 ENCOUNTER — Ambulatory Visit (INDEPENDENT_AMBULATORY_CARE_PROVIDER_SITE_OTHER): Payer: BC Managed Care – PPO | Admitting: Internal Medicine

## 2019-10-24 VITALS — BP 102/64 | HR 54 | Temp 96.2°F | Ht 65.0 in | Wt 175.0 lb

## 2019-10-24 DIAGNOSIS — Z0000 Encounter for general adult medical examination without abnormal findings: Secondary | ICD-10-CM | POA: Diagnosis not present

## 2019-10-24 DIAGNOSIS — Z1231 Encounter for screening mammogram for malignant neoplasm of breast: Secondary | ICD-10-CM

## 2019-10-24 DIAGNOSIS — K219 Gastro-esophageal reflux disease without esophagitis: Secondary | ICD-10-CM

## 2019-10-24 DIAGNOSIS — Z853 Personal history of malignant neoplasm of breast: Secondary | ICD-10-CM | POA: Diagnosis not present

## 2019-10-24 DIAGNOSIS — I10 Essential (primary) hypertension: Secondary | ICD-10-CM

## 2019-10-24 LAB — POCT URINALYSIS DIPSTICK
Bilirubin, UA: NEGATIVE
Blood, UA: NEGATIVE
Glucose, UA: NEGATIVE
Ketones, UA: NEGATIVE
Leukocytes, UA: NEGATIVE
Nitrite, UA: NEGATIVE
Protein, UA: NEGATIVE
Spec Grav, UA: 1.01 (ref 1.010–1.025)
Urobilinogen, UA: 0.2 E.U./dL
pH, UA: 6 (ref 5.0–8.0)

## 2019-10-24 NOTE — Progress Notes (Signed)
Date:  10/24/2019   Name:  Holly Villegas   DOB:  [birth year]   MRN:  [id number]   Chief Complaint: Annual Exam (Patient sees [facility] for mammo and breast exams. ) Holly Villegas is a 59 y.o. female who presents today for her Complete Annual Exam. She feels well in general; stays busy with grandkids since retirement.  She reports exercising regularly. She reports she is sleeping poorly - long standing, takes melatonin. She denies breast issues - mammograms and paps ordered by GYN.  Mammogram  05/2019 Pap  06/2018 Colonoscopy  09/2015 Immunization History  Administered Date(s) Administered  . Hep A / Hep B 01/20/2003  . Hepatitis B 01/20/2003  . Influenza,inj,quad, With Preservative 04/05/2019  . Influenza-Unspecified 05/06/2015, 04/04/2018  . Moderna SARS-COVID-2 Vaccination 08/27/2019, 10/04/2019  . Tdap 06/21/2010  . Zoster 12/06/2013  . Zoster Recombinat (Shingrix) 12/26/2018, 04/26/2019    Hypertension This is a chronic problem. The problem is controlled (at home120/80). Associated symptoms include palpitations (feels strange in her chest). Pertinent negatives include no chest pain, headaches or shortness of breath. Past treatments include ACE inhibitors. The current treatment provides significant improvement. There are no compliance problems.   Gastroesophageal Reflux She reports no abdominal pain, no chest pain, no coughing or no wheezing. This is a recurrent problem. Pertinent negatives include no fatigue. She has tried a PPI for the symptoms.  Breast cancer in 2017 - treated with XRT but declined AI therapy.  Currently followed annually by oncology - gets mammograms from GYN.  Lab Results  Component Value Date   CREATININE 0.73 12/26/2018   BUN 13 12/26/2018   NA 132 (L) 12/26/2018   K 4.6 12/26/2018   CL 95 (L) 12/26/2018   CO2 24 12/26/2018   Lab Results  Component Value Date   CHOL 256 (H) 12/26/2018   HDL 63 12/26/2018   LDLCALC 158 (H)  12/26/2018   TRIG 177 (H) 12/26/2018   CHOLHDL 4.1 12/26/2018   Lab Results  Component Value Date   TSH 2.180 06/22/2018   No results found for: HGBA1C Lab Results  Component Value Date   WBC 4.9 06/22/2018   HGB 13.2 06/22/2018   HCT 37.5 06/22/2018   MCV 85 06/22/2018   PLT 279 06/22/2018   Lab Results  Component Value Date   ALT 15 06/22/2018   AST 20 06/22/2018   ALKPHOS 89 06/22/2018   BILITOT 0.2 06/22/2018     Review of Systems  Constitutional: Negative for chills, fatigue and fever.  HENT: Negative for congestion, hearing loss, tinnitus, trouble swallowing and voice change.   Eyes: Negative for visual disturbance.  Respiratory: Negative for cough, chest tightness, shortness of breath and wheezing.   Cardiovascular: Positive for palpitations (feels strange in her chest). Negative for chest pain and leg swelling.  Gastrointestinal: Negative for abdominal pain, constipation, diarrhea and vomiting.  Endocrine: Negative for polydipsia and polyuria.  Genitourinary: Negative for dysuria, frequency, genital sores, vaginal bleeding and vaginal discharge.  Musculoskeletal: Negative for arthralgias, gait problem and joint swelling.  Skin: Negative for color change and rash.  Neurological: Negative for dizziness, tremors, light-headedness and headaches.  Hematological: Negative for adenopathy. Does not bruise/bleed easily.  Psychiatric/Behavioral: Negative for dysphoric mood and sleep disturbance. The patient is not nervous/anxious.     Patient Active Problem List   Diagnosis Date Noted  . Migraine with aura and without status migrainosus, not intractable 07/09/2019  . Malignant neoplasm of upper-outer quadrant of left breast  in female, estrogen receptor positive ([place]) 12/26/2018  . Lymphedema 02/03/2017  . Lumbar degenerative disc disease 06/16/2016  . Bilateral sciatica 06/11/2016  . History of left breast cancer 03/26/2016  . Aortic heart valve narrowing 09/15/2015   . Paroxysmal supraventricular tachycardia ([place]) 08/07/2015  . Left wrist tendonitis 06/09/2015  . Plantar fasciitis, bilateral 11/29/2014  . Vitamin D deficiency 11/29/2014  . Dupuytren's contracture of foot 11/29/2014  . Dyslipidemia 11/28/2014  . Environmental and seasonal allergies 11/28/2014  . Raynaud's syndrome without gangrene 11/28/2014  . Carpal tunnel syndrome 11/28/2014  . Benign colonic polyp 11/28/2014  . Diverticulosis of colon without diverticulitis 11/28/2014  . Essential hypertension 11/28/2014  . Varicose veins of both lower extremities with pain 11/28/2014  . Raynaud's phenomenon 11/28/2014    Allergies  Allergen Reactions  . Codeine Sulfate Nausea And Vomiting  . Gluten Meal   . Shellfish-Derived Products   . Sulfa Antibiotics Nausea And Vomiting  . Brassica Oleracea Nausea And Vomiting and Other (See Comments)    Does not digest and vomits up    Past Surgical History:  Procedure Laterality Date  . BREAST BIOPSY Left 02/24/2016   path pending  . CHOLECYSTECTOMY  2012  . COLONOSCOPY WITH PROPOFOL N/A 10/13/2015   Procedure: COLONOSCOPY WITH PROPOFOL;  Surgeon: Hulen Luster, MD;  Location: [facility] ENDOSCOPY;  Service: Gastroenterology;  Laterality: N/A;  . ESOPHAGEAL DILATION  07/2009  . THROAT SURGERY  2011   polyp removed from vocal cord  . TUBAL LIGATION  1991    Social History   Tobacco Use  . Smoking status: Never Smoker  . Smokeless tobacco: Never Used  Substance Use Topics  . Alcohol use: Yes    Alcohol/week: 0.0 standard drinks    Comment: social  . Drug use: No     Medication list has been reviewed and updated.  Current Meds  Medication Sig  . acetaminophen (TYLENOL) 500 MG tablet Take 1 tablet by mouth once as needed.  Marland Kitchen azelastine (ASTELIN) 0.1 % nasal spray Place 2 sprays into both nostrils 2 (two) times daily. Use in each nostril as directed  . Calcium-Magnesium-Vitamin D (CALCIUM MAGNESIUM PO) Take by mouth.  . cetirizine (ZYRTEC)  10 MG tablet TAKE (1) TABLET BY MOUTH EVERY DAY  . fluticasone (FLONASE) 50 MCG/ACT nasal spray Place 2 sprays into both nostrils daily.  Marland Kitchen lisinopril (ZESTRIL) 20 MG tablet Take 1 tablet (20 mg total) by mouth daily.  Marland Kitchen omeprazole (PRILOSEC) 20 MG capsule Take 1 capsule (20 mg total) by mouth daily.  . Probiotic Product (PROBIOTIC ADVANCED PO) Take by mouth.  . vitamin C (ASCORBIC ACID) 500 MG tablet Take 500 mg by mouth 2 (two) times daily.    PHQ 2/9 Scores 10/24/2019 07/09/2019 04/26/2019 12/26/2018  PHQ - 2 Score 0 0 0 0  PHQ- 9 Score 0 - - 1    BP Readings from Last 3 Encounters:  10/24/19 102/64  10/03/19 132/76  07/09/19 112/74    Physical Exam Vitals and nursing note reviewed.  Constitutional:      General: She is not in acute distress.    Appearance: She is well-developed.  HENT:     Head: Normocephalic and atraumatic.     Right Ear: Tympanic membrane and ear canal normal.     Left Ear: Tympanic membrane and ear canal normal.     Nose:     Right Sinus: No maxillary sinus tenderness.     Left Sinus: No maxillary sinus tenderness.  Eyes:  General: No scleral icterus.       Right eye: No discharge.        Left eye: No discharge.     Conjunctiva/sclera: Conjunctivae normal.  Neck:     Thyroid: No thyromegaly.     Vascular: No carotid bruit.  Cardiovascular:     Rate and Rhythm: Normal rate and regular rhythm.  No extrasystoles are present.    Pulses: Normal pulses.     Heart sounds: Normal heart sounds. No murmur.  Pulmonary:     Effort: Pulmonary effort is normal. No respiratory distress.     Breath sounds: No wheezing.  Abdominal:     General: Bowel sounds are normal.     Palpations: Abdomen is soft.     Tenderness: There is no abdominal tenderness.  Musculoskeletal:        General: Normal range of motion.     Cervical back: Normal range of motion. No erythema.  Lymphadenopathy:     Cervical: No cervical adenopathy.  Skin:    General: Skin is warm and dry.      Findings: No rash.  Neurological:     Mental Status: She is alert and oriented to person, place, and time.     Cranial Nerves: No cranial nerve deficit.     Sensory: No sensory deficit.     Deep Tendon Reflexes: Reflexes are normal and symmetric.  Psychiatric:        Attention and Perception: Attention normal.        Mood and Affect: Mood normal.        Speech: Speech normal.        Behavior: Behavior normal.     Wt Readings from Last 3 Encounters:  10/24/19 175 lb (79.4 kg)  10/03/19 173 lb (78.5 kg)  07/09/19 183 lb (83 kg)    BP 102/64   Pulse (!) 54   Temp (!) 96.2 F (35.7 C) (Temporal)   Ht 5\' 5"  (1.651 m)   Wt 175 lb (79.4 kg)   SpO2 99%   BMI 29.12 kg/m   Assessment and Plan: 1. Annual physical exam Normal exam Continue healthy diet and exercise - Lipid panel - POCT urinalysis dipstick  2. Encounter for screening mammogram for breast cancer To be scheduled by GYN  3. Essential hypertension Clinically stable exam with well controlled BP on lisinopril 20 mg. Tolerating medications without side effects at this time. Has follow up with cardiology regarding vague chest symptoms Pt to continue current regimen and low sodium diet; benefits of regular exercise as able discussed. - CBC with Differential/Platelet - Comprehensive metabolic panel - TSH  4. Gastroesophageal reflux disease, unspecified whether esophagitis present Symptoms well controlled on daily PPI No red flag signs such as weight loss, n/v, melena Will continue omeprazole 20 mg. - CBC with Differential/Platelet  5. History of left breast cancer No s/s of recurrence Continue self monitoring and annually mammograms   Partially dictated using Dragon software. Any errors are unintentional.  Halina Maidens, MD [facility]  10/24/2019

## 2019-10-25 LAB — CBC WITH DIFFERENTIAL/PLATELET
Basophils Absolute: 0.1 10*3/uL (ref 0.0–0.2)
Basos: 1 %
EOS (ABSOLUTE): 0.1 10*3/uL (ref 0.0–0.4)
Eos: 2 %
Hematocrit: 41.4 % (ref 34.0–46.6)
Hemoglobin: 14.3 g/dL (ref 11.1–15.9)
Immature Grans (Abs): 0 10*3/uL (ref 0.0–0.1)
Immature Granulocytes: 0 %
Lymphocytes Absolute: 1.9 10*3/uL (ref 0.7–3.1)
Lymphs: 37 %
MCH: 30.3 pg (ref 26.6–33.0)
MCHC: 34.5 g/dL (ref 31.5–35.7)
MCV: 88 fL (ref 79–97)
Monocytes Absolute: 0.4 10*3/uL (ref 0.1–0.9)
Monocytes: 8 %
Neutrophils Absolute: 2.7 10*3/uL (ref 1.4–7.0)
Neutrophils: 52 %
Platelets: 279 10*3/uL (ref 150–450)
RBC: 4.72 x10E6/uL (ref 3.77–5.28)
RDW: 12.3 % (ref 11.7–15.4)
WBC: 5.3 10*3/uL (ref 3.4–10.8)

## 2019-10-25 LAB — LIPID PANEL
Chol/HDL Ratio: 3.5 ratio (ref 0.0–4.4)
Cholesterol, Total: 256 mg/dL — ABNORMAL HIGH (ref 100–199)
HDL: 73 mg/dL (ref >39)
LDL Chol Calc (NIH): 155 mg/dL — ABNORMAL HIGH (ref 0–99)
Triglycerides: 156 mg/dL — ABNORMAL HIGH (ref 0–149)
VLDL Cholesterol Cal: 28 mg/dL (ref 5–40)

## 2019-10-25 LAB — COMPREHENSIVE METABOLIC PANEL
ALT: 13 IU/L (ref 0–32)
AST: 23 IU/L (ref 0–40)
Albumin/Globulin Ratio: 1.5 (ref 1.2–2.2)
Albumin: 4.2 g/dL (ref 3.8–4.9)
Alkaline Phosphatase: 90 IU/L (ref 39–117)
BUN/Creatinine Ratio: 18 (ref 12–28)
BUN: 16 mg/dL (ref 8–27)
Bilirubin Total: <0.2 mg/dL (ref 0.0–1.2)
CO2: 24 mmol/L (ref 20–29)
Calcium: 9.3 mg/dL (ref 8.7–10.3)
Chloride: 102 mmol/L (ref 96–106)
Creatinine, Ser: 0.9 mg/dL (ref 0.57–1.00)
GFR calc Af Amer: 80 mL/min/{1.73_m2} (ref >59)
GFR calc non Af Amer: 70 mL/min/{1.73_m2} (ref >59)
Globulin, Total: 2.8 g/dL (ref 1.5–4.5)
Glucose: 102 mg/dL — ABNORMAL HIGH (ref 65–99)
Potassium: 4.8 mmol/L (ref 3.5–5.2)
Sodium: 139 mmol/L (ref 134–144)
Total Protein: 7 g/dL (ref 6.0–8.5)

## 2019-10-25 LAB — TSH: TSH: 2.26 u[IU]/mL (ref 0.450–4.500)

## 2019-10-26 NOTE — Progress Notes (Signed)
Called and left VM informing pt of normal labs. Told her to call back if she has any questions. CM

## 2020-02-08 ENCOUNTER — Other Ambulatory Visit: Payer: Self-pay | Admitting: Internal Medicine

## 2020-02-08 DIAGNOSIS — I10 Essential (primary) hypertension: Secondary | ICD-10-CM

## 2020-03-03 ENCOUNTER — Ambulatory Visit: Payer: Self-pay | Admitting: *Deleted

## 2020-03-03 NOTE — Telephone Encounter (Signed)
C/o increase pain and numbness above right knee x 2 days. Denies swelling or redness. Describes as sore muscle and atrophy. Can walk on leg for normal activities. Requesting to see previous orthopedic MD. Patient is going to call orthopedic MD. Care advise given. Patient verbalized understanding of care advise and to call back if symptoms worsen.   Reason for Disposition . Leg pain  Answer Assessment - Initial Assessment Questions 1. ONSET: "When did the pain start?"      2 days ago  2. LOCATION: "Where is the pain located?"      Above right knee 3. PAIN: "How bad is the pain?"    (Scale 1-10; or mild, moderate, severe)   -  MILD (1-3): doesn't interfere with normal activities    -  MODERATE (4-7): interferes with normal activities (e.g., work or school) or awakens from sleep, limping    -  SEVERE (8-10): excruciating pain, unable to do any normal activities, unable to walk     Mild  4. WORK OR EXERCISE: "Has there been any recent work or exercise that involved this part of the body?"      No  5. CAUSE: "What do you think is causing the leg pain?"     Not sure . Thinks leg is atophied  6. OTHER SYMPTOMS: "Do you have any other symptoms?" (e.g., chest pain, back pain, breathing difficulty, swelling, rash, fever, numbness, weakness)     Numbness and pain  7. PREGNANCY: "Is there any chance you are pregnant?" "When was your last menstrual period?"     na  Protocols used: LEG PAIN-A-AH

## 2020-03-03 NOTE — Telephone Encounter (Signed)
Call pt to schedule earliest available appt. Pt needs to be seen before she can see ortho. Last RF was places in 2017. Pt will need a new RF.  Thank you,  KP

## 2020-03-03 NOTE — Telephone Encounter (Signed)
Pt set up for 03/17/2020

## 2020-03-17 ENCOUNTER — Ambulatory Visit: Payer: BC Managed Care – PPO | Admitting: Internal Medicine

## 2020-05-17 ENCOUNTER — Other Ambulatory Visit: Payer: Self-pay

## 2020-05-17 ENCOUNTER — Ambulatory Visit
Admission: EM | Admit: 2020-05-17 | Discharge: 2020-05-17 | Disposition: A | Discharge status: Home / Self Care | Payer: BC Managed Care – PPO | Attending: Emergency Medicine | Admitting: Emergency Medicine

## 2020-05-17 ENCOUNTER — Ambulatory Visit (INDEPENDENT_AMBULATORY_CARE_PROVIDER_SITE_OTHER): Payer: BC Managed Care – PPO

## 2020-05-17 ENCOUNTER — Encounter: Payer: Self-pay | Admitting: Emergency Medicine

## 2020-05-17 DIAGNOSIS — R001 Bradycardia, unspecified: Secondary | ICD-10-CM | POA: Diagnosis present

## 2020-05-17 DIAGNOSIS — R0602 Shortness of breath: Secondary | ICD-10-CM

## 2020-05-17 DIAGNOSIS — R5383 Other fatigue: Secondary | ICD-10-CM | POA: Diagnosis not present

## 2020-05-17 LAB — TSH: TSH: 2.092 u[IU]/mL (ref 0.350–4.500)

## 2020-05-17 LAB — BASIC METABOLIC PANEL
Anion gap: 9 (ref 5–15)
BUN: 14 mg/dL (ref 8–23)
CO2: 27 mmol/L (ref 22–32)
Calcium: 9.3 mg/dL (ref 8.9–10.3)
Chloride: 100 mmol/L (ref 98–111)
Creatinine, Ser: 0.78 mg/dL (ref 0.44–1.00)
GFR, Estimated: >60 mL/min (ref >60)
Glucose, Bld: 98 mg/dL (ref 70–99)
Potassium: 5 mmol/L (ref 3.5–5.1)
Sodium: 136 mmol/L (ref 135–145)

## 2020-05-17 LAB — MAGNESIUM: Magnesium: 2.1 mg/dL (ref 1.7–2.4)

## 2020-05-17 NOTE — Discharge Instructions (Signed)
You were seen for shortness of breath and slow heart rate.   Your labs today were normal. Your chest x-ray looks normal as well. I suggest follow-up with your primary care provider to further investigate a possible source of your shortness of breath and fatigue. A sleep study and pulmonary function test may be beneficial.  Take care, Dr. Marland Kitchen, NP-c

## 2020-05-17 NOTE — ED Triage Notes (Signed)
Patient states that she has not been feeling good since Thursday.  Patient states that she feels SOB and weak.  Patient states that she checked her pulse at and was running in the 20s and 30s.  Patient also reports pressure in her chest.

## 2020-05-17 NOTE — ED Provider Notes (Signed)
[facility] - [place], [place]   Name: Holly Villegas DOB: 1960/12/08 MRN: [id number] CSN: [id number] PCP: Glean Hess, MD  Arrival date and time:  05/17/20 1017  Chief Complaint:  Shortness of Breath and Bradycardia   NOTE: Prior to seeing the patient today, I have reviewed the triage nursing documentation and vital signs. Clinical staff has updated patient's PMH/PSHx, current medication list, and drug allergies/intolerances to ensure comprehensive history available to assist in medical decision making.   History:   HPI: Holly Villegas is a 59 y.o. female who presents today with complaints of shortness of breath and bradycardia.  Patient states she has felt increased fatigue since 11/25.  She has noticed it takes more effort for her to walk up a flight of stairs and she has increased shortness of breath.  He has known bradycardia and she used her home pulse ox meter to measure her heart rate; she noted it to be in the 20s and 30s.  Of note, patient is currently wearing dark red nail polish.  Patient has received a full work-up from cardiology due to her known diagnosis of bradycardia.  All her tests have returned inconclusive, including an echocardiogram which showed mild aortic valve stenosis, stress test with no ischemia, but a Holter monitor that showed rare PVCs.  Patient's concern today is that she has no longer baseline energy level and wants to rule out any acute complications.   Past Medical History:  Diagnosis Date  . Cancer ([place])    left sided breast ca  . Diverticulosis   . GERD (gastroesophageal reflux disease)   . Hypercholesteremia   . Hypertension     Past Surgical History:  Procedure Laterality Date  . BREAST BIOPSY Left 02/24/2016   path pending  . CHOLECYSTECTOMY  2012  . COLONOSCOPY WITH PROPOFOL N/A 10/13/2015   Procedure: COLONOSCOPY WITH PROPOFOL;  Surgeon: Hulen Luster, MD;  Location: [facility] ENDOSCOPY;  Service: Gastroenterology;   Laterality: N/A;  . ESOPHAGEAL DILATION  07/2009  . THROAT SURGERY  2011   polyp removed from vocal cord  . TUBAL LIGATION  1991    Family History  Problem Relation Age of Onset  . Hypertension Mother   . Migraines Mother   . Peptic Ulcer Disease Father   . Breast cancer Maternal Aunt 6    Social History   Tobacco Use  . Smoking status: Never Smoker  . Smokeless tobacco: Never Used  Vaping Use  . Vaping Use: Never used  Substance Use Topics  . Alcohol use: Yes    Alcohol/week: 0.0 standard drinks    Comment: social  . Drug use: No    Patient Active Problem List   Diagnosis Date Noted  . Migraine with aura and without status migrainosus, not intractable 07/09/2019  . Hx of breast cancer 12/26/2018  . Lymphedema 02/03/2017  . Lumbar degenerative disc disease 06/16/2016  . Bilateral sciatica 06/11/2016  . History of left breast cancer 03/26/2016  . Left wrist tendonitis 06/09/2015  . Plantar fasciitis, bilateral 11/29/2014  . Vitamin D deficiency 11/29/2014  . Dupuytren's contracture of foot 11/29/2014  . Dyslipidemia 11/28/2014  . Environmental and seasonal allergies 11/28/2014  . Raynaud's syndrome without gangrene 11/28/2014  . Carpal tunnel syndrome 11/28/2014  . Benign colonic polyp 11/28/2014  . Diverticulosis of colon without diverticulitis 11/28/2014  . Essential hypertension 11/28/2014  . Varicose veins of both lower extremities with pain 11/28/2014  . Raynaud's phenomenon 11/28/2014    Home  Medications:    Current Meds  Medication Sig  . Calcium-Magnesium-Vitamin D (CALCIUM MAGNESIUM PO) Take by mouth.  . cetirizine (ZYRTEC) 10 MG tablet TAKE (1) TABLET BY MOUTH EVERY DAY  . lisinopril (ZESTRIL) 20 MG tablet TAKE (1) TABLET BY MOUTH EVERY DAY  . Melatonin 10 MG TABS Take by mouth at bedtime as needed.  Marland Kitchen omeprazole (PRILOSEC) 20 MG capsule Take 1 capsule (20 mg total) by mouth daily.  . Probiotic Product (PROBIOTIC ADVANCED PO) Take by mouth.  .  vitamin C (ASCORBIC ACID) 500 MG tablet Take 500 mg by mouth 2 (two) times daily.    Allergies:   Codeine sulfate, Gluten meal, Shellfish-derived products, Sulfa antibiotics, and Brassica oleracea  Review of Systems (ROS): Review of Systems   Vital Signs: Today's Vitals   05/17/20 1037 05/17/20 1040 05/17/20 1216  BP:  (!) 147/83   Pulse:  (!) 55   Resp:  16   Temp:  98.3 F (36.8 C)   TempSrc:  Oral   SpO2:  100%   Weight: 182 lb (82.6 kg)    Height: 5\' 5"  (1.651 m)    PainSc: 2   2     Physical Exam: Physical Exam   Urgent Care Treatments / Results:   LABS: PLEASE NOTE: all labs that were ordered this encounter are listed, however only abnormal results are displayed. Labs Reviewed  BASIC METABOLIC PANEL  MAGNESIUM  TSH    EKG: -Sinus bradycardia with a ventricular rate of 53 bpm  RADIOLOGY: DG Chest 2 View  Result Date: 05/17/2020 CLINICAL DATA:  Shortness of breath EXAM: CHEST - 2 VIEW COMPARISON:  09/03/2010 FINDINGS: The heart size and mediastinal contours are within normal limits. Both lungs are clear. The visualized skeletal structures are unremarkable. IMPRESSION: No active cardiopulmonary disease. Electronically Signed   By: Davina Poke D.O.   On: 05/17/2020 12:12    PROCEDURES: Procedures  MEDICATIONS RECEIVED THIS VISIT: Medications - No data to display  PERTINENT CLINICAL COURSE NOTES/UPDATES:   Initial Impression / Assessment and Plan / Urgent Care Course:  Pertinent labs & imaging results that were available during my care of the patient were personally reviewed by me and considered in my medical decision making (see lab/imaging section of note for values and interpretations).  Holly Villegas is a 59 y.o. female who presents to [facility] today with complaints of sinus bradycardia and fatigue, diagnosed with the same, and treated as such with the directions below. NP and patient reviewed discharge instructions below during  visit.   Patient is well appearing overall in clinic today. She does not appear to be in any acute distress. Presenting symptoms (see HPI) and exam as documented above.   I have reviewed the follow up and strict return precautions for any new or worsening symptoms. Patient is aware of symptoms that would be deemed urgent/emergent, and would thus require further evaluation either here or in the emergency department. At the time of discharge, she verbalized understanding and consent with the discharge plan as it was reviewed with her. All questions were fielded by provider and/or clinic staff prior to patient discharge.    Final Clinical Impressions / Urgent Care Diagnoses:   Final diagnoses:  Fatigue, unspecified type  SOB (shortness of breath)  Bradycardia    New Prescriptions:  [place] Controlled Substance Registry consulted? Not Applicable  No orders of the defined types were placed in this encounter.     Discharge Instructions  You were seen for shortness of breath and slow heart rate.   Your labs today were normal. Your chest x-ray looks normal as well. I suggest follow-up with your primary care provider to further investigate a possible source of your shortness of breath and fatigue. A sleep study and pulmonary function test may be beneficial.  Take care, Dr. Marland Kitchen, NP-c     Recommended Follow up Care:  Patient encouraged to follow up with the following provider within the specified time frame, or sooner as dictated by the severity of her symptoms. As always, she was instructed that for any urgent/emergent care needs, she should seek care either here or in the emergency department for more immediate evaluation.   Gertie Baron, DNP, NP-c    Gertie Baron, NP 05/17/20 1257

## 2020-07-03 ENCOUNTER — Encounter: Payer: Self-pay | Admitting: Internal Medicine

## 2020-07-04 NOTE — Telephone Encounter (Signed)
Please review.  KP

## 2020-07-11 ENCOUNTER — Encounter: Payer: Self-pay | Admitting: Internal Medicine

## 2020-07-22 ENCOUNTER — Ambulatory Visit: Payer: BC Managed Care – PPO | Admitting: Internal Medicine

## 2020-07-22 ENCOUNTER — Encounter: Payer: Self-pay | Admitting: Internal Medicine

## 2020-07-22 ENCOUNTER — Other Ambulatory Visit: Payer: Self-pay

## 2020-07-22 VITALS — BP 100/74 | HR 58 | Temp 98.1°F | Ht 65.0 in | Wt 175.0 lb

## 2020-07-22 DIAGNOSIS — I10 Essential (primary) hypertension: Secondary | ICD-10-CM | POA: Diagnosis not present

## 2020-07-22 DIAGNOSIS — G479 Sleep disorder, unspecified: Secondary | ICD-10-CM | POA: Diagnosis not present

## 2020-07-22 NOTE — Progress Notes (Signed)
Date:  07/22/2020   Name:  Holly Villegas   DOB:  Aug 28, 1960   MRN:  [id number]   Chief Complaint: Referral (Sleep study, had sleep study done 2019/2020 before was inconclusive was only able to sleep for 3 hours, RF was placed by OBGYN, wakes up in the middle of the night 6-8 times) Seen by cardiology with complete work up for bradycardia.  ECHO - mild AS; stress test negative and Holter monitor with PVCs.  Still has fatigue and GYN suggested a sleep study.  She wakes up multiple times per night. Previous study was not conclusive - only slept about 3 hours.  The report is not available since it was ordered by an outside provider.  Insomnia Primary symptoms: difficulty falling asleep, frequent awakening, premature morning awakening.  Past treatments include medication (melatonin and camomille tea).    Lab Results  Component Value Date   CREATININE 0.78 05/17/2020   BUN 14 05/17/2020   NA 136 05/17/2020   K 5.0 05/17/2020   CL 100 05/17/2020   CO2 27 05/17/2020   Lab Results  Component Value Date   CHOL 256 (H) 10/24/2019   HDL 73 10/24/2019   LDLCALC 155 (H) 10/24/2019   TRIG 156 (H) 10/24/2019   CHOLHDL 3.5 10/24/2019   Lab Results  Component Value Date   TSH 2.092 05/17/2020   No results found for: HGBA1C Lab Results  Component Value Date   WBC 5.3 10/24/2019   HGB 14.3 10/24/2019   HCT 41.4 10/24/2019   MCV 88 10/24/2019   PLT 279 10/24/2019   Lab Results  Component Value Date   ALT 13 10/24/2019   AST 23 10/24/2019   ALKPHOS 90 10/24/2019   BILITOT <0.2 10/24/2019     Review of Systems  Constitutional: Positive for fatigue. Negative for chills, fever and unexpected weight change.  HENT: Negative for trouble swallowing.   Respiratory: Positive for shortness of breath. Negative for chest tightness and wheezing.   Cardiovascular: Positive for palpitations. Negative for chest pain and leg swelling.  Gastrointestinal: Negative for abdominal pain,  constipation and diarrhea.  Neurological: Negative for dizziness and headaches.  Psychiatric/Behavioral: Negative for dysphoric mood. The patient has insomnia. The patient is not nervous/anxious.     Patient Active Problem List   Diagnosis Date Noted  . Migraine with aura and without status migrainosus, not intractable 07/09/2019  . Hx of breast cancer 12/26/2018  . Lymphedema 02/03/2017  . Lumbar degenerative disc disease 06/16/2016  . Bilateral sciatica 06/11/2016  . History of left breast cancer 03/26/2016  . Left wrist tendonitis 06/09/2015  . Plantar fasciitis, bilateral 11/29/2014  . Vitamin D deficiency 11/29/2014  . Dupuytren's contracture of foot 11/29/2014  . Dyslipidemia 11/28/2014  . Environmental and seasonal allergies 11/28/2014  . Raynaud's syndrome without gangrene 11/28/2014  . Carpal tunnel syndrome 11/28/2014  . Benign colonic polyp 11/28/2014  . Diverticulosis of colon without diverticulitis 11/28/2014  . Essential hypertension 11/28/2014  . Varicose veins of both lower extremities with pain 11/28/2014  . Raynaud's phenomenon 11/28/2014    Allergies  Allergen Reactions  . Codeine Sulfate Nausea And Vomiting  . Gluten Meal   . Shellfish-Derived Products   . Sulfa Antibiotics Nausea And Vomiting  . Brassica Oleracea Nausea And Vomiting and Other (See Comments)    Does not digest and vomits up    Past Surgical History:  Procedure Laterality Date  . BREAST BIOPSY Left 02/24/2016   path pending  . CHOLECYSTECTOMY  2012  . COLONOSCOPY WITH PROPOFOL N/A 10/13/2015   Procedure: COLONOSCOPY WITH PROPOFOL;  Surgeon: Hulen Luster, MD;  Location: [facility] ENDOSCOPY;  Service: Gastroenterology;  Laterality: N/A;  . ESOPHAGEAL DILATION  07/2009  . THROAT SURGERY  2011   polyp removed from vocal cord  . TUBAL LIGATION  1991    Social History   Tobacco Use  . Smoking status: Never Smoker  . Smokeless tobacco: Never Used  Vaping Use  . Vaping Use: Never used   Substance Use Topics  . Alcohol use: Yes    Alcohol/week: 0.0 standard drinks    Comment: social  . Drug use: No     Medication list has been reviewed and updated.  Current Meds  Medication Sig  . acetaminophen (TYLENOL) 500 MG tablet Take 1 tablet by mouth once as needed.  . Ascorbic Acid (VITAMIN C PO) Take by mouth daily.  Marland Kitchen azelastine (ASTELIN) 0.1 % nasal spray Place 2 sprays into both nostrils 2 (two) times daily. Use in each nostril as directed (Patient taking differently: Place 2 sprays into both nostrils as needed. Use in each nostril as directed)  . Calcium-Magnesium-Vitamin D (CALCIUM MAGNESIUM PO) Take by mouth.  . cetirizine (ZYRTEC) 10 MG tablet TAKE (1) TABLET BY MOUTH EVERY DAY  . esomeprazole (NEXIUM) 40 MG capsule Take by mouth.  . fluticasone (FLONASE) 50 MCG/ACT nasal spray Place 2 sprays into both nostrils daily.  Marland Kitchen lisinopril (ZESTRIL) 20 MG tablet TAKE (1) TABLET BY MOUTH EVERY DAY  . Melatonin 10 MG TABS Take by mouth at bedtime as needed.  . Probiotic Product (PROBIOTIC ADVANCED PO) Take by mouth.  . vitamin C (ASCORBIC ACID) 500 MG tablet Take 500 mg by mouth 2 (two) times daily.  Marland Kitchen VITAMIN D PO Take by mouth daily.    PHQ 2/9 Scores 07/22/2020 10/24/2019 07/09/2019 04/26/2019  PHQ - 2 Score 0 0 0 0  PHQ- 9 Score 4 0 - -    GAD 7 : Generalized Anxiety Score 07/22/2020 10/24/2019 12/26/2018  Nervous, Anxious, on Edge 0 0 0  Control/stop worrying 0 0 0  Worry too much - different things 0 0 0  Trouble relaxing 0 0 0  Restless 0 0 0  Easily annoyed or irritable 0 0 0  Afraid - awful might happen 0 0 0  Total GAD 7 Score 0 0 0  Anxiety Difficulty - Not difficult at all Not difficult at all   Results of the Epworth flowsheet 07/22/2020  Sitting and reading 1  Watching TV 1  Sitting, inactive in a public place (e.g. a theatre or a meeting) 0  As a passenger in a car for an hour without a break 1  Lying down to rest in the afternoon when circumstances permit 3   Sitting and talking to someone 0  Sitting quietly after a lunch without alcohol 0  In a car, while stopped for a few minutes in traffic 0  Total score 6     BP Readings from Last 3 Encounters:  07/22/20 100/74  05/17/20 (!) 147/83  10/24/19 102/64    Physical Exam Vitals and nursing note reviewed.  Constitutional:      General: She is not in acute distress.    Appearance: Normal appearance. She is well-developed.  HENT:     Head: Normocephalic and atraumatic.  Cardiovascular:     Rate and Rhythm: Normal rate and regular rhythm.  Pulmonary:     Effort: Pulmonary effort is normal. No  respiratory distress.     Breath sounds: No wheezing or rhonchi.  Musculoskeletal:     Cervical back: Normal range of motion.     Right lower leg: No edema.     Left lower leg: No edema.  Skin:    General: Skin is warm and dry.     Capillary Refill: Capillary refill takes less than 2 seconds.     Findings: No rash.  Neurological:     General: No focal deficit present.     Mental Status: She is alert and oriented to person, place, and time.  Psychiatric:        Mood and Affect: Mood and affect and mood normal.        Behavior: Behavior normal.     Wt Readings from Last 3 Encounters:  07/22/20 175 lb (79.4 kg)  05/17/20 182 lb (82.6 kg)  10/24/19 175 lb (79.4 kg)    BP 100/74   Pulse (!) 58   Temp 98.1 F (36.7 C) (Oral)   Ht 5\' 5"  (1.651 m)   Wt 175 lb (79.4 kg)   SpO2 99%   BMI 29.12 kg/m   Assessment and Plan: 1. Sleep disturbance Will order home sleep study and advise when results return Continue over the counter sleep aids - Ambulatory referral to Sleep Studies  2. Essential hypertension Clinically stable exam with well controlled BP. Tolerating medications without side effects at this time. Pt to continue current regimen and low sodium diet; benefits of regular exercise as able discussed.   Partially dictated using Editor, commissioning. Any errors are  unintentional.  Halina Maidens, MD [facility]  07/22/2020

## 2020-08-26 LAB — HM MAMMOGRAPHY

## 2020-10-09 ENCOUNTER — Other Ambulatory Visit: Payer: Self-pay | Admitting: Internal Medicine

## 2020-10-09 DIAGNOSIS — I10 Essential (primary) hypertension: Secondary | ICD-10-CM

## 2020-10-09 NOTE — Telephone Encounter (Signed)
Requested Prescriptions  Pending Prescriptions Disp Refills  . lisinopril (ZESTRIL) 20 MG tablet [Pharmacy Med Name: LISINOPRIL 20 MG TAB] 30 tablet 5    Sig: TAKE (1) TABLET BY MOUTH EVERY DAY     Cardiovascular:  ACE Inhibitors Passed - 10/09/2020  8:04 AM      Passed - Cr in normal range and within 180 days    Creatinine, Ser  Date Value Ref Range Status  05/17/2020 0.78 0.44 - 1.00 mg/dL Final         Passed - K in normal range and within 180 days    Potassium  Date Value Ref Range Status  05/17/2020 5.0 3.5 - 5.1 mmol/L Final         Passed - Patient is not pregnant      Passed - Last BP in normal range    BP Readings from Last 1 Encounters:  07/22/20 100/74         Passed - Valid encounter within last 6 months    Recent Outpatient Visits          2 months ago Sleep disturbance   [facility] Glean Hess, MD   11 months ago Annual physical exam   [facility] Glean Hess, MD   1 year ago [facility] Glean Hess, MD   1 year ago Diverticulosis of colon without diverticulitis   [facility] Glean Hess, MD   1 year ago Essential hypertension   [facility] Glean Hess, MD      Future Appointments            In 2 months Army Melia Jesse Sans, MD [facility], [facility]

## 2020-10-27 ENCOUNTER — Encounter: Payer: BC Managed Care – PPO | Admitting: Internal Medicine

## 2020-10-28 ENCOUNTER — Telehealth: Payer: Self-pay

## 2020-10-28 NOTE — Telephone Encounter (Signed)
Called to inform patient of message. 

## 2020-10-28 NOTE — Telephone Encounter (Signed)
Copied from [place] [id number]. Topic: General - Other >> Oct 28, 2020  8:31 AM Leward Quan A wrote: Reason for CRM: Patient called in to see if Dr Army Melia can send in an Rx to a pharmacy in [place] for her where she is on vacation. Say that she have Hemorrhagic Cystitis asking for a call back at Ph# [phone]

## 2020-11-03 ENCOUNTER — Encounter: Payer: Self-pay | Admitting: Internal Medicine

## 2020-11-03 ENCOUNTER — Other Ambulatory Visit: Payer: Self-pay | Admitting: Internal Medicine

## 2020-11-03 ENCOUNTER — Telehealth: Payer: Self-pay

## 2020-11-03 DIAGNOSIS — N3001 Acute cystitis with hematuria: Secondary | ICD-10-CM

## 2020-11-03 MED ORDER — CEFUROXIME AXETIL 250 MG PO TABS: 250.0000 mg | ORAL_TABLET | Freq: Two times a day (BID) | ORAL | 0 refills | Status: AC

## 2020-11-03 NOTE — Telephone Encounter (Signed)
Copied from [place] [id number]. Topic: General - Other >> Nov 03, 2020  1:06 PM Celene Kras wrote: Reason for CRM: Pt called stating that she was seen in [place] due to a uti. She states that the she will take her last pill today and is worried about bleeding. She states that she will need to be seen before next available on 11/12/20. Please advise.

## 2020-11-03 NOTE — Telephone Encounter (Signed)
Patient sent my chart message - will respond there.

## 2020-11-12 ENCOUNTER — Ambulatory Visit: Payer: BC Managed Care – PPO | Admitting: Internal Medicine

## 2020-11-20 ENCOUNTER — Ambulatory Visit
Admission: RE | Admit: 2020-11-20 | Discharge: 2020-11-20 | Disposition: A | Discharge status: Home / Self Care | Payer: Self-pay | Source: Ambulatory Visit | Attending: Internal Medicine | Admitting: Internal Medicine

## 2020-11-20 ENCOUNTER — Other Ambulatory Visit: Payer: Self-pay | Admitting: Internal Medicine

## 2020-11-20 ENCOUNTER — Other Ambulatory Visit: Payer: Self-pay

## 2020-11-20 DIAGNOSIS — E7801 Familial hypercholesterolemia: Secondary | ICD-10-CM | POA: Insufficient documentation

## 2020-11-20 DIAGNOSIS — I10 Essential (primary) hypertension: Secondary | ICD-10-CM | POA: Insufficient documentation

## 2020-12-16 ENCOUNTER — Telehealth: Payer: Self-pay

## 2020-12-16 NOTE — Telephone Encounter (Signed)
Copied from [place] [id number]. Topic: Appointment Scheduling - Scheduling Inquiry for Clinic >> Dec 16, 2020  8:49 AM Holly Villegas E wrote: Reason for CRM: Pt had to cancel CEP for tomorrow due to a family emergency out of town/ pt asked if Dr. Ronnald Ramp can put in order for the labs and she can come get those done after July 18th when she returns / Pt stated she was advised to have her blood work done to be put on a statin/ next CPE opening was in November /please advise

## 2020-12-16 NOTE — Telephone Encounter (Signed)
Please call pt and schedule CPE for next available. We do not order labs before a visit. We will draw labs at her CPE.  KP

## 2020-12-17 ENCOUNTER — Encounter: Payer: BC Managed Care – PPO | Admitting: Internal Medicine

## 2020-12-17 NOTE — Telephone Encounter (Signed)
Patient mention that she will send a message to both doctors about prescription medicines for statin.

## 2021-01-08 ENCOUNTER — Ambulatory Visit: Payer: Self-pay | Admitting: *Deleted

## 2021-01-08 NOTE — Telephone Encounter (Signed)
Pt reports positive home test Tuesday, PCR resulted today. Reports symptoms onset Monday; cough,body aches, temp 100.0, fatigued, nasal drainage. States O2 sat at 100%. Denies any SOB, no CP or wheezing, no chest tightness. Pt vaccinated, 1 booster Oct. 2021, [place]. States worse symptom lack of energy, fatigue. Pt is interested in oral anti viral meds. Home care advise given as well as symptoms that warrant an ED eval. Pt verbalizes understanding.  Please advise:  CB# [phone]     Reason for Disposition  [1] COVID-19 diagnosed by positive lab test (e.g., PCR, rapid self-test kit) AND [2] mild symptoms (e.g., cough, fever, others) AND [6] no complications or SOB  Answer Assessment - Initial Assessment Questions 1. COVID-19 DIAGNOSIS: "Who made your COVID-19 diagnosis?" "Was it confirmed by a positive lab test or self-test?" If not diagnosed by a doctor (or NP/PA), ask "Are there lots of cases (community spread) where you live?" Note: See public health department website, if unsure.     Home test Tuesday positive, PCR resulted today positive 2. COVID-19 EXPOSURE: "Was there any known exposure to COVID before the symptoms began?" CDC Definition of close contact: within 6 feet (2 meters) for a total of 15 minutes or more over a 24-hour period.      no 3. ONSET: "When did the COVID-19 symptoms start?"      Monday 4. WORST SYMPTOM: "What is your worst symptom?" (e.g., cough, fever, shortness of breath, muscle aches)     No energy 5. COUGH: "Do you have a cough?" If Yes, ask: "How bad is the cough?"       Yes, productive in AMs, unsure of color 6. FEVER: "Do you have a fever?" If Yes, ask: "What is your temperature, how was it measured, and when did it start?"     100.0 max 7. RESPIRATORY STATUS: "Describe your breathing?" (e.g., shortness of breath, wheezing, unable to speak)      No 8. BETTER-SAME-WORSE: "Are you getting better, staying the same or getting worse compared to yesterday?"  If  getting worse, ask, "In what way?"    Worse, no energy 9. HIGH RISK DISEASE: "Do you have any chronic medical problems?" (e.g., asthma, heart or lung disease, weak immune system, obesity, etc.)     *No Answer* 10. VACCINE: "Have you had the COVID-19 vaccine?" If Yes, ask: "Which one, how many shots, when did you get it?"       yes 11. BOOSTER: "Have you received your COVID-19 booster?" If Yes, ask: "Which one and when did you get it?"       1 booster, Oct 2021    3. OTHER SYMPTOMS: "Do you have any other symptoms?"  (e.g., chills, fatigue, headache, loss of smell or taste, muscle pain, sore throat)       Fatigue, headache, body aches, scratchy voice, 14. O2 SATURATION MONITOR:  "Do you use an oxygen saturation monitor (pulse oximeter) at home?" If Yes, ask "What is your reading (oxygen level) today?" "What is your usual oxygen saturation reading?" (e.g., 95%)      100%  Protocols used: Coronavirus (COVID-19) Diagnosed or Suspected-A-AH

## 2021-01-09 NOTE — Telephone Encounter (Signed)
Called patient and informed her per Dr Army Melia that since she is not considered moderate to high risk due to her health history she will need to take tylenol as needed every 6 hours, rest, and stay hydrated. Informed patient I also sent all of this information to her in her my chart as well.  She verbalized understanding.

## 2021-01-19 DIAGNOSIS — I700 Atherosclerosis of aorta: Secondary | ICD-10-CM | POA: Insufficient documentation

## 2021-03-02 ENCOUNTER — Ambulatory Visit
Admission: EM | Admit: 2021-03-02 | Discharge: 2021-03-02 | Disposition: A | Discharge status: Home / Self Care | Payer: BC Managed Care – PPO | Attending: Physician Assistant | Admitting: Physician Assistant

## 2021-03-02 ENCOUNTER — Ambulatory Visit (INDEPENDENT_AMBULATORY_CARE_PROVIDER_SITE_OTHER): Payer: BC Managed Care – PPO

## 2021-03-02 ENCOUNTER — Other Ambulatory Visit: Payer: Self-pay

## 2021-03-02 ENCOUNTER — Encounter: Payer: Self-pay | Admitting: Emergency Medicine

## 2021-03-02 DIAGNOSIS — S6392XA Sprain of unspecified part of left wrist and hand, initial encounter: Secondary | ICD-10-CM

## 2021-03-02 DIAGNOSIS — W19XXXA Unspecified fall, initial encounter: Secondary | ICD-10-CM | POA: Diagnosis not present

## 2021-03-02 DIAGNOSIS — S60222A Contusion of left hand, initial encounter: Secondary | ICD-10-CM

## 2021-03-02 DIAGNOSIS — M79642 Pain in left hand: Secondary | ICD-10-CM | POA: Diagnosis not present

## 2021-03-02 NOTE — ED Triage Notes (Signed)
Pt presents today with c/o left hand pain/swelling. She reports falling on it while playing pickle ball on 02/11/2021. No deformity.

## 2021-03-02 NOTE — ED Provider Notes (Signed)
[facility]    CSN: [id number] Arrival date & time: 03/02/21  1233      History   Chief Complaint Chief Complaint  Patient presents with   Hand Pain    left    HPI Holly Villegas is a 60 y.o. female who fell on her L hand and injured 4th and 5th metacarpals  when she fell playing pickle ball on 8/24. She has been taking care of it and hoping it would get better, but has not improved. Is still having pain  and her L wrist gets easily tired when she uses her hand. She is R hand dominant.     Past Medical History:  Diagnosis Date   Cancer ([place])    left sided breast ca   Diverticulosis    GERD (gastroesophageal reflux disease)    Hypercholesteremia    Hypertension     Patient Active Problem List   Diagnosis Date Noted   Migraine with aura and without status migrainosus, not intractable 07/09/2019   Hx of breast cancer 12/26/2018   Lymphedema 02/03/2017   Lumbar degenerative disc disease 06/16/2016   Bilateral sciatica 06/11/2016   History of left breast cancer 03/26/2016   Left wrist tendonitis 06/09/2015   Plantar fasciitis, bilateral 11/29/2014   Vitamin D deficiency 11/29/2014   Dupuytren's contracture of foot 11/29/2014   Dyslipidemia 11/28/2014   Environmental and seasonal allergies 11/28/2014   Raynaud's syndrome without gangrene 11/28/2014   Carpal tunnel syndrome 11/28/2014   Benign colonic polyp 11/28/2014   Diverticulosis of colon without diverticulitis 11/28/2014   Essential hypertension 11/28/2014   Varicose veins of both lower extremities with pain 11/28/2014   Raynaud's phenomenon 11/28/2014    Past Surgical History:  Procedure Laterality Date   BREAST BIOPSY Left 02/24/2016   path pending   CHOLECYSTECTOMY  2012   COLONOSCOPY WITH PROPOFOL N/A 10/13/2015   Procedure: COLONOSCOPY WITH PROPOFOL;  Surgeon: Hulen Luster, MD;  Location: [facility] ENDOSCOPY;  Service: Gastroenterology;  Laterality: N/A;   ESOPHAGEAL DILATION  07/2009    THROAT SURGERY  2011   polyp removed from vocal cord   TUBAL LIGATION  1991    OB History   No obstetric history on file.      Home Medications    Prior to Admission medications   Medication Sig Start Date End Date Taking? Authorizing Provider  acetaminophen (TYLENOL) 500 MG tablet Take 1 tablet by mouth once as needed.    [provider]  Ascorbic Acid (VITAMIN C PO) Take by mouth daily.    [provider]  azelastine (ASTELIN) 0.1 % nasal spray Place 2 sprays into both nostrils 2 (two) times daily. Use in each nostril as directed Patient taking differently: Place 2 sprays into both nostrils as needed. Use in each nostril as directed 06/22/18   Glean Hess, MD  Calcium-Magnesium-Vitamin D (CALCIUM MAGNESIUM PO) Take by mouth.    [provider]  cetirizine (ZYRTEC) 10 MG tablet TAKE (1) TABLET BY MOUTH EVERY DAY 06/17/15   Glean Hess, MD  esomeprazole (NEXIUM) 40 MG capsule Take by mouth. 12/02/09   [provider]  fluticasone (FLONASE) 50 MCG/ACT nasal spray Place 2 sprays into both nostrils daily. 06/22/18   Glean Hess, MD  lisinopril (ZESTRIL) 20 MG tablet TAKE (1) TABLET BY MOUTH EVERY DAY 10/09/20   Glean Hess, MD  Melatonin 10 MG TABS Take by mouth at bedtime as needed.    [provider]  Probiotic Product (PROBIOTIC ADVANCED PO) Take by mouth.    [provider]  vitamin C (ASCORBIC ACID) 500 MG tablet Take 500 mg by mouth 2 (two) times daily.    [provider]  VITAMIN D PO Take by mouth daily.    [provider]    Family History Family History  Problem Relation Age of Onset   Hypertension Mother    Migraines Mother    Peptic Ulcer Disease Father    Breast cancer Maternal Aunt 40    Social History Social History   Tobacco Use   Smoking status: Never   Smokeless tobacco: Never  Vaping Use   Vaping Use: Never used  Substance Use Topics   Alcohol use: Yes     Alcohol/week: 0.0 standard drinks    Comment: social   Drug use: No     Allergies   Codeine sulfate, Gluten meal, Shellfish-derived products, Sulfa antibiotics, and Brassica oleracea   Review of Systems Review of Systems + L hand pain, weakness of grip, L wrist gets easily fatigued. The rest is neg.   Physical Exam Triage Vital Signs ED Triage Vitals  Enc Vitals Group     BP 03/02/21 1357 139/79     Pulse Rate 03/02/21 1357 (!) 50     Resp 03/02/21 1357 18     Temp 03/02/21 1357 98.4 F (36.9 C)     Temp Source 03/02/21 1357 Oral     SpO2 03/02/21 1357 100 %     Weight --      Height --      Head Circumference --      Peak Flow --      Pain Score 03/02/21 1355 4     Pain Loc --      Pain Edu? --      Excl. in [place]? --    No data found.  Updated Vital Signs BP 139/79 (BP Location: Left Arm)   Pulse (!) 50   Temp 98.4 F (36.9 C) (Oral)   Resp 18   SpO2 100%   Visual Acuity Right Eye Distance:   Left Eye Distance:   Bilateral Distance:    Right Eye Near:   Left Eye Near:    Bilateral Near:     Physical Exam Constitutional:      General: She is not in acute distress.    Appearance: She is normal weight. She is not toxic-appearing.  HENT:     Head: Normocephalic.  Eyes:     General: No scleral icterus.    Conjunctiva/sclera: Conjunctivae normal.  Pulmonary:     Effort: Pulmonary effort is normal.  Musculoskeletal:     Cervical back: Neck supple.     Comments: L hand- with fainting ecchymosis on dorsal distal hand around 4th and 5th knuckles. Has local tenderness on 4th and 5th metacarpals. Does not have any deformity of her fingers when she makes a fist. Strength 4/5  Skin:    General: Skin is warm and dry.     Findings: No rash.  Neurological:     Mental Status: She is alert and oriented to person, place, and time.     Gait: Gait normal.  Psychiatric:        Mood and Affect: Mood normal.        Behavior: Behavior normal.        Thought Content:  Thought content normal.        Judgment: Judgment normal.  UC Treatments / Results  Labs (all labs ordered are listed, but only abnormal results are displayed) Labs Reviewed - No data to display  EKG   Radiology DG Hand Complete Left  Result Date: 03/02/2021 CLINICAL DATA:  Pain and swelling of the left hand. [place] 02/11/2021. EXAM: LEFT HAND - COMPLETE 3+ VIEW COMPARISON:  None. FINDINGS: There is no evidence of fracture or dislocation. There is no evidence of arthropathy or other focal bone abnormality. Soft tissues are unremarkable. IMPRESSION: Negative. Electronically Signed   By: Nelson Chimes M.D.   On: 03/02/2021 14:37    Procedures Procedures (including critical care time)  Medications Ordered in UC Medications - No data to display  Initial Impression / Assessment and Plan / UC Course  I have reviewed the triage vital signs and the nursing notes. Pertinent  imaging results that were available during my care of the patient were reviewed by me and considered in my medical decision making (see chart for details). Contusion L hand.  I placed her on a splint to wear for 7 days and see if this helps, but should also seek OT to help her fully heal.     Final Clinical Impressions(s) / UC Diagnoses   Final diagnoses:  Hand sprain, left, initial encounter  Contusion of left hand, initial encounter     Discharge Instructions      Follow up with occupational therapy to help you heal Try the brace for one week.      ED Prescriptions   None    PDMP not reviewed this encounter.   Shelby Mattocks, PA-C 03/02/21 1501

## 2021-03-02 NOTE — Discharge Instructions (Addendum)
Follow up with occupational therapy to help you heal Try the brace for one week.

## 2021-04-25 ENCOUNTER — Other Ambulatory Visit: Payer: Self-pay | Admitting: Internal Medicine

## 2021-04-25 DIAGNOSIS — I10 Essential (primary) hypertension: Secondary | ICD-10-CM

## 2021-04-25 DIAGNOSIS — J3089 Other allergic rhinitis: Secondary | ICD-10-CM

## 2021-04-25 NOTE — Telephone Encounter (Signed)
Requested medication (s) are due for refill today: yes  Requested medication (s) are on the active medication list: yes  Last refill:  10/09/20 #30 5 RF  Future visit scheduled: yes I 2 days 04/27/21  Notes to clinic:  pt stated she has enough med to last until appt-    Requested Prescriptions  Pending Prescriptions Disp Refills   lisinopril (ZESTRIL) 20 MG tablet [Pharmacy Med Name: LISINOPRIL 20 MG TAB] 30 tablet     Sig: TAKE (1) TABLET BY MOUTH EVERY DAY     Cardiovascular:  ACE Inhibitors Failed - 04/25/2021 11:07 AM      Failed - Cr in normal range and within 180 days    Creatinine, Ser  Date Value Ref Range Status  05/17/2020 0.78 0.44 - 1.00 mg/dL Final          Failed - K in normal range and within 180 days    Potassium  Date Value Ref Range Status  05/17/2020 5.0 3.5 - 5.1 mmol/L Final          Failed - Valid encounter within last 6 months    Recent Outpatient Visits           9 months ago Sleep disturbance   [facility] Glean Hess, MD   1 year ago Annual physical exam   [facility] Glean Hess, MD   1 year ago [facility] Glean Hess, MD   2 years ago Diverticulosis of colon without diverticulitis   [facility] Glean Hess, MD   2 years ago Essential hypertension   [place], Laura H, MD       Future Appointments             In 2 days Glean Hess, MD [facility], [place] - Patient is not pregnant      Passed - Last BP in normal range    BP Readings from Last 1 Encounters:  03/02/21 139/79          Signed Prescriptions Disp Refills   fluticasone (FLONASE) 50 MCG/ACT nasal spray 16 g 5    Sig: PLACE 2 SPRAYS INTO BOTH NOSTRILS EVERY DAY     Ear, Nose, and Throat: Nasal Preparations - Corticosteroids Passed - 04/25/2021 11:07 AM      Passed - Valid encounter within last 12 months    Recent Outpatient Visits            9 months ago Sleep disturbance   [facility] Glean Hess, MD   1 year ago Annual physical exam   [facility] Glean Hess, MD   1 year ago [facility] Glean Hess, MD   2 years ago Diverticulosis of colon without diverticulitis   [facility] Glean Hess, MD   2 years ago Essential hypertension   [place], Laura H, MD       Future Appointments             In 2 days Glean Hess, MD [facility]             azelastine (ASTELIN) 0.1 % nasal spray 30 mL 5    Sig: PLACE 2 SPRAYS INTO BOTH NOSTRILS TWICE DAILY     Ear, Nose, and Throat: Nasal Preparations - Antiallergy  Passed - 04/25/2021 11:07 AM      Passed - Valid encounter within last 12 months    Recent Outpatient Visits           9 months ago Sleep disturbance   [facility] Glean Hess, MD   1 year ago Annual physical exam   [facility] Glean Hess, MD   1 year ago [facility] Glean Hess, MD   2 years ago Diverticulosis of colon without diverticulitis   [facility] Glean Hess, MD   2 years ago Essential hypertension   [facility] Glean Hess, MD       Future Appointments             In 2 days Glean Hess, MD [facility], [facility]

## 2021-04-25 NOTE — Telephone Encounter (Signed)
Requested Prescriptions  Pending Prescriptions Disp Refills  . lisinopril (ZESTRIL) 20 MG tablet [Pharmacy Med Name: LISINOPRIL 20 MG TAB] 30 tablet     Sig: TAKE (1) TABLET BY MOUTH EVERY DAY     Cardiovascular:  ACE Inhibitors Failed - 04/25/2021 11:07 AM      Failed - Cr in normal range and within 180 days    Creatinine, Ser  Date Value Ref Range Status  05/17/2020 0.78 0.44 - 1.00 mg/dL Final         Failed - K in normal range and within 180 days    Potassium  Date Value Ref Range Status  05/17/2020 5.0 3.5 - 5.1 mmol/L Final         Failed - Valid encounter within last 6 months    Recent Outpatient Visits          9 months ago Sleep disturbance   [facility] Glean Hess, MD   1 year ago Annual physical exam   [facility] Glean Hess, MD   1 year ago [facility] Glean Hess, MD   2 years ago Diverticulosis of colon without diverticulitis   [facility] Glean Hess, MD   2 years ago Essential hypertension   [place], Laura H, MD      Future Appointments            In 2 days Glean Hess, MD [facility], [place] - Patient is not pregnant      Passed - Last BP in normal range    BP Readings from Last 1 Encounters:  03/02/21 139/79         . fluticasone (FLONASE) 50 MCG/ACT nasal spray [Pharmacy Med Name: FLUTICASONE PROPIONATE 50 MCG/ACT N] 16 g 5    Sig: PLACE 2 SPRAYS INTO BOTH NOSTRILS EVERY DAY     Ear, Nose, and Throat: Nasal Preparations - Corticosteroids Passed - 04/25/2021 11:07 AM      Passed - Valid encounter within last 12 months    Recent Outpatient Visits          9 months ago Sleep disturbance   [facility] Glean Hess, MD   1 year ago Annual physical exam   [facility] Glean Hess, MD   1 year ago [facility] Glean Hess, MD   2 years ago Diverticulosis of colon  without diverticulitis   [facility] Glean Hess, MD   2 years ago Essential hypertension   [facility] Glean Hess, MD      Future Appointments            In 2 days Glean Hess, MD [facility]           . azelastine (ASTELIN) 0.1 % nasal spray [Pharmacy Med Name: AZELASTINE HCL 0.1% NASAL SOLN ML] 30 mL 5    Sig: PLACE 2 SPRAYS INTO BOTH NOSTRILS TWICE DAILY     Ear, Nose, and Throat: Nasal Preparations - Antiallergy Passed - 04/25/2021 11:07 AM      Passed - Valid encounter within last 12 months    Recent Outpatient Visits          9 months ago Sleep disturbance   [facility] Glean Hess, MD   1 year ago Annual physical  exam   [facility] Glean Hess, MD   1 year ago [facility] Glean Hess, MD   2 years ago Diverticulosis of colon without diverticulitis   [facility] Glean Hess, MD   2 years ago Essential hypertension   [facility] Glean Hess, MD      Future Appointments            In 2 days Army Melia Jesse Sans, MD [facility], [facility]

## 2021-04-27 ENCOUNTER — Ambulatory Visit: Payer: BC Managed Care – PPO | Admitting: Internal Medicine

## 2021-04-27 ENCOUNTER — Encounter: Payer: Self-pay | Admitting: Internal Medicine

## 2021-04-27 ENCOUNTER — Other Ambulatory Visit: Payer: Self-pay

## 2021-04-27 VITALS — BP 122/80 | HR 57 | Ht 65.0 in | Wt 178.0 lb

## 2021-04-27 DIAGNOSIS — E785 Hyperlipidemia, unspecified: Secondary | ICD-10-CM | POA: Diagnosis not present

## 2021-04-27 DIAGNOSIS — Z1211 Encounter for screening for malignant neoplasm of colon: Secondary | ICD-10-CM

## 2021-04-27 DIAGNOSIS — I10 Essential (primary) hypertension: Secondary | ICD-10-CM | POA: Diagnosis not present

## 2021-04-27 DIAGNOSIS — R3911 Hesitancy of micturition: Secondary | ICD-10-CM

## 2021-04-27 DIAGNOSIS — I700 Atherosclerosis of aorta: Secondary | ICD-10-CM

## 2021-04-27 MED ORDER — ATORVASTATIN CALCIUM 20 MG PO TABS: 20.0000 mg | ORAL_TABLET | Freq: Every day | ORAL | 1 refills | Status: DC

## 2021-04-27 NOTE — Progress Notes (Signed)
Date:  04/27/2021   Name:  Holly Villegas   DOB:  05-Jan-1961   MRN:  [id number]   Chief Complaint: Hypertension  Hypertension This is a chronic problem. The problem is controlled. Pertinent negatives include no chest pain, headaches, palpitations or shortness of breath. Past treatments include ACE inhibitors. The current treatment provides significant improvement.  Hyperlipidemia This is a chronic problem. Pertinent negatives include no chest pain or shortness of breath. Current antihyperlipidemic treatment includes statins (atorvastatin started in August). Risk factors for coronary artery disease include dyslipidemia (elevated coronary calcium score).  Urinary issues - has had 2 infections over the past year - both associated with air travel.  Now she also notes hesitancy that is intermittent - very slow stream that is almost a drip and it takes a long time to empty completely.  It is not constant and she is unsure if there is a common situation. Lab Results  Component Value Date   CREATININE 0.78 05/17/2020   BUN 14 05/17/2020   NA 136 05/17/2020   K 5.0 05/17/2020   CL 100 05/17/2020   CO2 27 05/17/2020   Lab Results  Component Value Date   CHOL 256 (H) 10/24/2019   HDL 73 10/24/2019   LDLCALC 155 (H) 10/24/2019   TRIG 156 (H) 10/24/2019   CHOLHDL 3.5 10/24/2019   Lab Results  Component Value Date   TSH 2.092 05/17/2020   No results found for: HGBA1C Lab Results  Component Value Date   WBC 5.3 10/24/2019   HGB 14.3 10/24/2019   HCT 41.4 10/24/2019   MCV 88 10/24/2019   PLT 279 10/24/2019   Lab Results  Component Value Date   ALT 13 10/24/2019   AST 23 10/24/2019   ALKPHOS 90 10/24/2019   BILITOT <0.2 10/24/2019     Review of Systems  Constitutional:  Negative for fatigue and unexpected weight change.  HENT:  Negative for nosebleeds.   Eyes:  Negative for visual disturbance.  Respiratory:  Negative for cough, chest tightness, shortness of breath and  wheezing.   Cardiovascular:  Negative for chest pain, palpitations and leg swelling.  Gastrointestinal:  Negative for abdominal pain, constipation and diarrhea.  Genitourinary:  Positive for difficulty urinating. Negative for dysuria and hematuria.  Musculoskeletal:  Negative for arthralgias, gait problem and joint swelling.  Neurological:  Negative for dizziness, weakness, light-headedness and headaches.  Psychiatric/Behavioral:  Negative for dysphoric mood and sleep disturbance. The patient is not nervous/anxious.    Patient Active Problem List   Diagnosis Date Noted   Aortic atherosclerosis ([place]) 01/19/2021   Migraine with aura and without status migrainosus, not intractable 07/09/2019   Hx of breast cancer 12/26/2018   Lymphedema 02/03/2017   Lumbar degenerative disc disease 06/16/2016   Bilateral sciatica 06/11/2016   History of left breast cancer 03/26/2016   Left wrist tendonitis 06/09/2015   Plantar fasciitis, bilateral 11/29/2014   Vitamin D deficiency 11/29/2014   Dupuytren's contracture of foot 11/29/2014   Dyslipidemia 11/28/2014   Environmental and seasonal allergies 11/28/2014   Raynaud's syndrome without gangrene 11/28/2014   Carpal tunnel syndrome 11/28/2014   Benign colonic polyp 11/28/2014   Diverticulosis of colon without diverticulitis 11/28/2014   Essential hypertension 11/28/2014   Varicose veins of both lower extremities with pain 11/28/2014   Raynaud's phenomenon 11/28/2014    Allergies  Allergen Reactions   Codeine Sulfate Nausea And Vomiting   Gluten Meal    Shellfish-Derived Products    Sulfa Antibiotics Nausea And  Vomiting   Brassica Oleracea Nausea And Vomiting and Other (See Comments)    Does not digest and vomits up    Past Surgical History:  Procedure Laterality Date   BREAST BIOPSY Left 02/24/2016   path pending   CHOLECYSTECTOMY  2012   COLONOSCOPY WITH PROPOFOL N/A 10/13/2015   Procedure: COLONOSCOPY WITH PROPOFOL;  Surgeon: Hulen Luster,  MD;  Location: [facility] ENDOSCOPY;  Service: Gastroenterology;  Laterality: N/A;   ESOPHAGEAL DILATION  07/2009   THROAT SURGERY  2011   polyp removed from vocal cord   TUBAL LIGATION  1991    Social History   Tobacco Use   Smoking status: Never   Smokeless tobacco: Never  Vaping Use   Vaping Use: Never used  Substance Use Topics   Alcohol use: Yes    Alcohol/week: 0.0 standard drinks    Comment: social   Drug use: No     Medication list has been reviewed and updated.  Current Meds  Medication Sig   acetaminophen (TYLENOL) 500 MG tablet Take 1 tablet by mouth once as needed.   Ascorbic Acid (VITAMIN C PO) Take by mouth daily.   atorvastatin (LIPITOR) 20 MG tablet Take 20 mg by mouth daily.   azelastine (ASTELIN) 0.1 % nasal spray PLACE 2 SPRAYS INTO BOTH NOSTRILS TWICE DAILY   Calcium-Magnesium-Vitamin D (CALCIUM MAGNESIUM PO) Take by mouth.   cetirizine (ZYRTEC) 10 MG tablet TAKE (1) TABLET BY MOUTH EVERY DAY   esomeprazole (NEXIUM) 40 MG capsule Take by mouth.   fluticasone (FLONASE) 50 MCG/ACT nasal spray PLACE 2 SPRAYS INTO BOTH NOSTRILS EVERY DAY   lisinopril (ZESTRIL) 20 MG tablet TAKE (1) TABLET BY MOUTH EVERY DAY   Melatonin 10 MG TABS Take by mouth at bedtime as needed.   Probiotic Product (PROBIOTIC ADVANCED PO) Take by mouth.   vitamin C (ASCORBIC ACID) 500 MG tablet Take 500 mg by mouth 2 (two) times daily.   VITAMIN D PO Take by mouth daily.    PHQ 2/9 Scores 04/27/2021 07/22/2020 10/24/2019 07/09/2019  PHQ - 2 Score 0 0 0 0  PHQ- 9 Score 0 4 0 -    GAD 7 : Generalized Anxiety Score 04/27/2021 07/22/2020 10/24/2019 12/26/2018  Nervous, Anxious, on Edge 0 0 0 0  Control/stop worrying 0 0 0 0  Worry too much - different things 0 0 0 0  Trouble relaxing 0 0 0 0  Restless 0 0 0 0  Easily annoyed or irritable 0 0 0 0  Afraid - awful might happen 0 0 0 0  Total GAD 7 Score 0 0 0 0  Anxiety Difficulty Not difficult at all - Not difficult at all Not difficult at all     BP Readings from Last 3 Encounters:  04/27/21 122/80  03/02/21 139/79  07/22/20 100/74    Physical Exam Vitals and nursing note reviewed.  Constitutional:      General: She is not in acute distress.    Appearance: Normal appearance. She is well-developed.  HENT:     Head: Normocephalic and atraumatic.  Neck:     Vascular: No carotid bruit.  Cardiovascular:     Rate and Rhythm: Normal rate and regular rhythm.     Heart sounds: No murmur heard. Pulmonary:     Effort: Pulmonary effort is normal. No respiratory distress.     Breath sounds: No wheezing or rhonchi.  Musculoskeletal:        General: Normal range of motion.  Cervical back: Normal range of motion.     Right lower leg: No edema.     Left lower leg: No edema.     Comments: Varicose veins LLE  Lymphadenopathy:     Cervical: No cervical adenopathy.  Skin:    General: Skin is warm and dry.     Capillary Refill: Capillary refill takes less than 2 seconds.     Findings: No rash.  Neurological:     General: No focal deficit present.     Mental Status: She is alert and oriented to person, place, and time.  Psychiatric:        Mood and Affect: Mood normal.        Behavior: Behavior normal.    Wt Readings from Last 3 Encounters:  04/27/21 178 lb (80.7 kg)  07/22/20 175 lb (79.4 kg)  05/17/20 182 lb (82.6 kg)    BP 122/80   Pulse (!) 57   Ht 5\' 5"  (1.651 m)   Wt 178 lb (80.7 kg)   SpO2 96%   BMI 29.62 kg/m   Assessment and Plan: 1. Essential hypertension Clinically stable exam with well controlled BP. Tolerating medications without side effects at this time. Pt to continue current regimen and low sodium diet; benefits of regular exercise as able discussed. - Comprehensive metabolic panel - CBC with Differential/Platelet - TSH  2. Dyslipidemia Now back on statin without side effects Will check labs - adjust dose if needed - Lipid panel - atorvastatin (LIPITOR) 20 MG tablet; Take 1 tablet (20 mg  total) by mouth daily.  Dispense: 90 tablet; Refill: 1  3. Colon cancer screening Received call from [facility] Dr. Alice Reichert - she needs a referral - Ambulatory referral to Gastroenterology  4. Urinary hesitancy Not ready for Urology referral at this time - will pay more attention to her sx and call if she decides to be seen  5. Aortic atherosclerosis ([place]) Continue statin therapy   Partially dictated using Editor, commissioning. Any errors are unintentional.  Halina Maidens, MD [facility]  04/27/2021

## 2021-04-28 LAB — CBC WITH DIFFERENTIAL/PLATELET
Basophils Absolute: 0.1 10*3/uL (ref 0.0–0.2)
Basos: 1 %
EOS (ABSOLUTE): 0.1 10*3/uL (ref 0.0–0.4)
Eos: 2 %
Hematocrit: 39.1 % (ref 34.0–46.6)
Hemoglobin: 13.1 g/dL (ref 11.1–15.9)
Immature Grans (Abs): 0 10*3/uL (ref 0.0–0.1)
Immature Granulocytes: 0 %
Lymphocytes Absolute: 1.9 10*3/uL (ref 0.7–3.1)
Lymphs: 37 %
MCH: 29.3 pg (ref 26.6–33.0)
MCHC: 33.5 g/dL (ref 31.5–35.7)
MCV: 88 fL (ref 79–97)
Monocytes Absolute: 0.5 10*3/uL (ref 0.1–0.9)
Monocytes: 9 %
Neutrophils Absolute: 2.7 10*3/uL (ref 1.4–7.0)
Neutrophils: 51 %
Platelets: 306 10*3/uL (ref 150–450)
RBC: 4.47 x10E6/uL (ref 3.77–5.28)
RDW: 12.1 % (ref 11.7–15.4)
WBC: 5.2 10*3/uL (ref 3.4–10.8)

## 2021-04-28 LAB — COMPREHENSIVE METABOLIC PANEL
ALT: 30 IU/L (ref 0–32)
AST: 33 IU/L (ref 0–40)
Albumin/Globulin Ratio: 1.6 (ref 1.2–2.2)
Albumin: 4.4 g/dL (ref 3.8–4.8)
Alkaline Phosphatase: 101 IU/L (ref 44–121)
BUN/Creatinine Ratio: 12 (ref 12–28)
BUN: 11 mg/dL (ref 8–27)
Bilirubin Total: 0.3 mg/dL (ref 0.0–1.2)
CO2: 26 mmol/L (ref 20–29)
Calcium: 9.7 mg/dL (ref 8.7–10.3)
Chloride: 101 mmol/L (ref 96–106)
Creatinine, Ser: 0.89 mg/dL (ref 0.57–1.00)
Globulin, Total: 2.7 g/dL (ref 1.5–4.5)
Glucose: 111 mg/dL — ABNORMAL HIGH (ref 70–99)
Potassium: 4.9 mmol/L (ref 3.5–5.2)
Sodium: 139 mmol/L (ref 134–144)
Total Protein: 7.1 g/dL (ref 6.0–8.5)
eGFR: 73 mL/min/{1.73_m2} (ref >59)

## 2021-04-28 LAB — LIPID PANEL
Chol/HDL Ratio: 3.1 ratio (ref 0.0–4.4)
Cholesterol, Total: 188 mg/dL (ref 100–199)
HDL: 60 mg/dL (ref >39)
LDL Chol Calc (NIH): 109 mg/dL — ABNORMAL HIGH (ref 0–99)
Triglycerides: 106 mg/dL (ref 0–149)
VLDL Cholesterol Cal: 19 mg/dL (ref 5–40)

## 2021-04-28 LAB — TSH: TSH: 1.46 u[IU]/mL (ref 0.450–4.500)

## 2021-05-20 ENCOUNTER — Ambulatory Visit: Payer: Self-pay | Admitting: *Deleted

## 2021-05-20 NOTE — Telephone Encounter (Signed)
Noted  Pt told to go to UC or ED. No available appts.  KP

## 2021-05-20 NOTE — Telephone Encounter (Signed)
Pls fu with pt as has a UTI and has had 2 days,  bad history burning, frequency and normally states  has hemorraghic bleeding after 2nd day. No appt available [phone]   Called patient to review symptoms . C/o burning, frequency, urgency urinating x 2 days . Started taking AZO yesterday and reports symptoms decreased. C/o hx UTI with hemorrhagic bleeding after 2 days . Denies fever, blood in urine. Reports last UTI x 1 month ago and required antibiotics. Contacted [facility] and no available appt for today or tomorrow. Instructed patient to go to [facility] or ED today . Care advise given. Patient verbalized understanding of care advise and to call back and go to [facility] or ED today.

## 2021-05-20 NOTE — Telephone Encounter (Signed)
Reason for Disposition  Age > 50 years  Answer Assessment - Initial Assessment Questions 1. SEVERITY: "How bad is the pain?"  (e.g., Scale 1-10; mild, moderate, or severe)   - MILD (1-3): complains slightly about urination hurting   - MODERATE (4-7): interferes with normal activities     - SEVERE (8-10): excruciating, unwilling or unable to urinate because of the pain      Mild to moderate  2. FREQUENCY: "How many times have you had painful urination today?"      na 3. PATTERN: "Is pain present every time you urinate or just sometimes?"      Burning and frequent, urgency noted  4. ONSET: "When did the painful urination start?"      2 days ago  5. FEVER: "Do you have a fever?" If Yes, ask: "What is your temperature, how was it measured, and when did it start?"     no 6. PAST UTI: "Have you had a urine infection before?" If Yes, ask: "When was the last time?" and "What happened that time?"      Yes x 1 months ago  7. CAUSE: "What do you think is causing the painful urination?"  (e.g., UTI, scratch, Herpes sore)     UTI 8. OTHER SYMPTOMS: "Do you have any other symptoms?" (e.g., flank pain, vaginal discharge, genital sores, urgency, blood in urine)     Burning , frequency, urgency  9. PREGNANCY: "Is there any chance you are pregnant?" "When was your last menstrual period?"     na  Protocols used: Urination Pain - Female-A-AH

## 2021-05-21 ENCOUNTER — Encounter: Payer: Self-pay | Admitting: Licensed Clinical Social Worker

## 2021-05-21 ENCOUNTER — Ambulatory Visit
Admission: EM | Admit: 2021-05-21 | Discharge: 2021-05-21 | Disposition: A | Discharge status: Home / Self Care | Payer: BC Managed Care – PPO | Attending: Internal Medicine | Admitting: Internal Medicine

## 2021-05-21 ENCOUNTER — Other Ambulatory Visit: Payer: Self-pay

## 2021-05-21 DIAGNOSIS — N3000 Acute cystitis without hematuria: Secondary | ICD-10-CM | POA: Insufficient documentation

## 2021-05-21 LAB — URINALYSIS, COMPLETE (UACMP) WITH MICROSCOPIC
Bilirubin Urine: NEGATIVE
Glucose, UA: NEGATIVE mg/dL
Hgb urine dipstick: NEGATIVE
Ketones, ur: NEGATIVE mg/dL
Leukocytes,Ua: NEGATIVE
Nitrite: POSITIVE — AB
Protein, ur: NEGATIVE mg/dL
Specific Gravity, Urine: 1.015 (ref 1.005–1.030)
pH: 7 (ref 5.0–8.0)

## 2021-05-21 MED ORDER — NITROFURANTOIN MONOHYD MACRO 100 MG PO CAPS: 100.0000 mg | ORAL_CAPSULE | Freq: Two times a day (BID) | ORAL | 0 refills | Status: DC

## 2021-05-21 NOTE — ED Provider Notes (Signed)
[facility]    CSN: [id number] Arrival date & time: 05/21/21  1346      History   Chief Complaint Chief Complaint  Patient presents with   Dysuria    HPI Holly Villegas is a 60 y.o. female who present with dysuria  and frequency x 2 days. Has been taking Azo. Has had 2 UTI's this year so far. Her PCP is aware of recent frequency UTI's/ Pt denies constipation, fever, chills or flank pain.     Past Medical History:  Diagnosis Date   Cancer ([place])    left sided breast ca   Diverticulosis    GERD (gastroesophageal reflux disease)    Hypercholesteremia    Hypertension     Patient Active Problem List   Diagnosis Date Noted   Aortic atherosclerosis ([place]) 01/19/2021   Migraine with aura and without status migrainosus, not intractable 07/09/2019   Hx of breast cancer 12/26/2018   Lymphedema 02/03/2017   Lumbar degenerative disc disease 06/16/2016   Bilateral sciatica 06/11/2016   History of left breast cancer 03/26/2016   Left wrist tendonitis 06/09/2015   Plantar fasciitis, bilateral 11/29/2014   Vitamin D deficiency 11/29/2014   Dupuytren's contracture of foot 11/29/2014   Dyslipidemia 11/28/2014   Environmental and seasonal allergies 11/28/2014   Raynaud's syndrome without gangrene 11/28/2014   Carpal tunnel syndrome 11/28/2014   Benign colonic polyp 11/28/2014   Diverticulosis of colon without diverticulitis 11/28/2014   Essential hypertension 11/28/2014   Varicose veins of both lower extremities with pain 11/28/2014   Raynaud's phenomenon 11/28/2014    Past Surgical History:  Procedure Laterality Date   BREAST BIOPSY Left 02/24/2016   path pending   CHOLECYSTECTOMY  2012   COLONOSCOPY WITH PROPOFOL N/A 10/13/2015   Procedure: COLONOSCOPY WITH PROPOFOL;  Surgeon: Hulen Luster, MD;  Location: [facility] ENDOSCOPY;  Service: Gastroenterology;  Laterality: N/A;   ESOPHAGEAL DILATION  07/2009   THROAT SURGERY  2011   polyp removed from vocal cord    TUBAL LIGATION  1991    OB History   No obstetric history on file.      Home Medications    Prior to Admission medications   Medication Sig Start Date End Date Taking? Authorizing Provider  nitrofurantoin, macrocrystal-monohydrate, (MACROBID) 100 MG capsule Take 1 capsule (100 mg total) by mouth 2 (two) times daily. 05/21/21  Yes Rodriguez-Southworth, Sunday Spillers, PA-C  acetaminophen (TYLENOL) 500 MG tablet Take 1 tablet by mouth once as needed.    [provider]  Ascorbic Acid (VITAMIN C PO) Take by mouth daily.    [provider]  atorvastatin (LIPITOR) 20 MG tablet Take 1 tablet (20 mg total) by mouth daily. 04/27/21   Glean Hess, MD  azelastine (ASTELIN) 0.1 % nasal spray PLACE 2 SPRAYS INTO BOTH NOSTRILS TWICE DAILY 04/25/21   Glean Hess, MD  Calcium-Magnesium-Vitamin D (CALCIUM MAGNESIUM PO) Take by mouth.    [provider]  cetirizine (ZYRTEC) 10 MG tablet TAKE (1) TABLET BY MOUTH EVERY DAY 06/17/15   Glean Hess, MD  esomeprazole (NEXIUM) 40 MG capsule Take by mouth. 12/02/09   [provider]  fluticasone (FLONASE) 50 MCG/ACT nasal spray PLACE 2 SPRAYS INTO BOTH NOSTRILS EVERY DAY 04/25/21   Glean Hess, MD  lisinopril (ZESTRIL) 20 MG tablet TAKE (1) TABLET BY MOUTH EVERY DAY 04/27/21   Glean Hess, MD  Melatonin 10 MG TABS Take by mouth at bedtime as needed.    [provider]  Probiotic Product (PROBIOTIC ADVANCED PO) Take by mouth.    [provider]  vitamin C (ASCORBIC ACID) 500 MG tablet Take 500 mg by mouth 2 (two) times daily.    [provider]  VITAMIN D PO Take by mouth daily.    [provider]    Family History Family History  Problem Relation Age of Onset   Hypertension Mother    Migraines Mother    Peptic Ulcer Disease Father    Breast cancer Maternal Aunt 40    Social History Social History   Tobacco Use   Smoking status: Never   Smokeless tobacco: Never   Vaping Use   Vaping Use: Never used  Substance Use Topics   Alcohol use: Yes    Alcohol/week: 0.0 standard drinks    Comment: social   Drug use: No     Allergies   Codeine sulfate, Gluten meal, Shellfish-derived products, Sulfa antibiotics, and Brassica oleracea   Review of Systems Review of Systems  Constitutional:  Negative for fever.  Genitourinary:  Positive for dysuria and frequency. Negative for flank pain.    Physical Exam Triage Vital Signs ED Triage Vitals  Enc Vitals Group     BP 05/21/21 1405 (!) 150/98     Pulse Rate 05/21/21 1405 (!) 52     Resp 05/21/21 1405 16     Temp 05/21/21 1405 98.2 F (36.8 C)     Temp src --      SpO2 05/21/21 1405 100 %     Weight 05/21/21 1404 177 lb 14.6 oz (80.7 kg)     Height 05/21/21 1404 5\' 5"  (1.651 m)     Head Circumference --      Peak Flow --      Pain Score 05/21/21 1403 3     Pain Loc --      Pain Edu? --      Excl. in [place]? --    No data found.  Updated Vital Signs BP (!) 150/98 (BP Location: Left Arm)   Pulse (!) 52   Temp 98.2 F (36.8 C)   Resp 16   Ht 5\' 5"  (1.651 m)   Wt 177 lb 14.6 oz (80.7 kg)   SpO2 100%   BMI 29.61 kg/m   Visual Acuity Right Eye Distance:   Left Eye Distance:   Bilateral Distance:    Right Eye Near:   Left Eye Near:    Bilateral Near:      Physical Exam Vitals and nursing note reviewed.  Constitutional:      General: She is not in acute distress.    Appearance: She is not toxic-appearing.  HENT:     Head: Normocephalic.     Right Ear: External ear normal.     Left Ear: External ear normal.  Eyes:     General: No scleral icterus.    Conjunctiva/sclera: Conjunctivae normal.  Pulmonary:     Effort: Pulmonary effort is normal.  Abdominal:     General: Bowel sounds are normal.     Palpations: Abdomen is soft. There is no mass.     Tenderness: There is no guarding or rebound.     Comments: - CVA tenderness   Musculoskeletal:        General: Normal range of  motion.     Cervical back: Neck supple.      Skin:    General: Skin is warm and dry.     Findings: No rash.  Neurological:     Mental Status: She is alert and oriented to person, place, and time.     Gait: Gait normal.  Psychiatric:        Mood and Affect: Mood normal.        Behavior: Behavior normal.        Thought Content: Thought content normal.        Judgment: Judgment normal.    UC Treatments / Results  Labs (all labs ordered are listed, but only abnormal results are displayed) Labs Reviewed  URINALYSIS, COMPLETE (UACMP) WITH MICROSCOPIC - Abnormal; Notable for the following components:      Result Value   Nitrite POSITIVE (*)    Bacteria, UA FEW (*)    All other components within normal limits  URINE CULTURE    EKG   Radiology No results found.  Procedures Procedures (including critical care time)  Medications Ordered in UC Medications - No data to display  Initial Impression / Assessment and Plan / UC Course  I have reviewed the triage vital signs and the nursing notes.  Pertinent labs results that were available during my care of the patient were reviewed by me and considered in my medical decision making (see chart for details). Urine culture was sent out and we will call her if we need to make any changes.   Final Clinical Impressions(s) / UC Diagnoses   Final diagnoses:  Acute cystitis without hematuria   Discharge Instructions   None    ED Prescriptions     Medication Sig Dispense Auth. Provider   nitrofurantoin, macrocrystal-monohydrate, (MACROBID) 100 MG capsule Take 1 capsule (100 mg total) by mouth 2 (two) times daily. 10 capsule Rodriguez-Southworth, Sunday Spillers, PA-C      PDMP not reviewed this encounter.   Shelby Mattocks, [place] 05/21/21 1448

## 2021-05-21 NOTE — ED Triage Notes (Signed)
Pt c/o painful urination, frequency x 2 days. Using azo

## 2021-05-22 LAB — URINE CULTURE: Culture: <10000 — AB

## 2021-07-20 LAB — RESULTS CONSOLE HPV: CHL HPV: NEGATIVE

## 2021-07-20 LAB — HM PAP SMEAR: HM Pap smear: NORMAL

## 2021-07-22 ENCOUNTER — Other Ambulatory Visit: Payer: Self-pay

## 2021-07-22 ENCOUNTER — Ambulatory Visit
Admission: EM | Admit: 2021-07-22 | Discharge: 2021-07-22 | Disposition: A | Discharge status: Home / Self Care | Payer: BC Managed Care – PPO | Attending: Emergency Medicine | Admitting: Emergency Medicine

## 2021-07-22 DIAGNOSIS — J0100 Acute maxillary sinusitis, unspecified: Secondary | ICD-10-CM | POA: Insufficient documentation

## 2021-07-22 DIAGNOSIS — Z20822 Contact with and (suspected) exposure to covid-19: Secondary | ICD-10-CM | POA: Diagnosis present

## 2021-07-22 LAB — RESP PANEL BY RT-PCR (FLU A&B, COVID) ARPGX2
Influenza A by PCR: NEGATIVE
Influenza B by PCR: NEGATIVE
SARS Coronavirus 2 by RT PCR: NEGATIVE

## 2021-07-22 MED ORDER — AMOXICILLIN-POT CLAVULANATE 875-125 MG PO TABS: 1.0000 | ORAL_TABLET | Freq: Two times a day (BID) | ORAL | 0 refills | Status: DC

## 2021-07-22 NOTE — ED Triage Notes (Signed)
Pt reports sinus pressure, throat discomfort with drainage, headache, cough x 4 days.

## 2021-07-22 NOTE — ED Provider Notes (Signed)
HPI  SUBJECTIVE:  Holly Villegas is a 61 y.o. female who presents with 5 days of a scratchy throat, 4 days of headache, frontal sinus pain and pressure, nasal congestion, rhinorrhea that started off as clear and has now become yellow, postnasal drip.  She reports a cough productive of the same material as her nasal congestion.  No fevers, body aches, facial swelling, upper dental pain, wheezing, shortness of breath, nausea, vomiting, diarrhea, abdominal pain.  Her grandkids are currently sick with URIs, it is not known if they have COVID, flu, RSV.  No known COVID or flu exposure.  She got 4 doses of the COVID-vaccine and this years flu vaccine.  No antibiotics in the past month.  No antipyretic in the past 6 hours.  No double sickening.  She has tried Chad pot, Vicks, Mucinex, humidifier, NyQuil with temporary relief.  No aggravating factors.  She has a past medical history of hypertension, COVID in July 22, hypercholesterolemia.    Past Medical History:  Diagnosis Date   Cancer [facility])    left sided breast ca   Diverticulosis    GERD (gastroesophageal reflux disease)    Hypercholesteremia    Hypertension     Past Surgical History:  Procedure Laterality Date   BREAST BIOPSY Left 02/24/2016   path pending   CHOLECYSTECTOMY  2012   COLONOSCOPY WITH PROPOFOL N/A 10/13/2015   Procedure: COLONOSCOPY WITH PROPOFOL;  Surgeon: Hulen Luster, MD;  Location: [facility] ENDOSCOPY;  Service: Gastroenterology;  Laterality: N/A;   ESOPHAGEAL DILATION  07/2009   THROAT SURGERY  2011   polyp removed from vocal cord   TUBAL LIGATION  1991    Family History  Problem Relation Age of Onset   Hypertension Mother    Migraines Mother    Peptic Ulcer Disease Father    Breast cancer Maternal Aunt 40    Social History   Tobacco Use   Smoking status: Never   Smokeless tobacco: Never  Vaping Use   Vaping Use: Never used  Substance Use Topics   Alcohol use: Yes    Alcohol/week: 0.0 standard drinks     Comment: social   Drug use: No    No current facility-administered medications for this encounter.  Current Outpatient Medications:    amoxicillin-clavulanate (AUGMENTIN) 875-125 MG tablet, Take 1 tablet by mouth 2 (two) times daily. X 7 days, Disp: 14 tablet, Rfl: 0   acetaminophen (TYLENOL) 500 MG tablet, Take 1 tablet by mouth once as needed., Disp: , Rfl:    Ascorbic Acid (VITAMIN C PO), Take by mouth daily., Disp: , Rfl:    atorvastatin (LIPITOR) 20 MG tablet, Take 1 tablet (20 mg total) by mouth daily., Disp: 90 tablet, Rfl: 1   azelastine (ASTELIN) 0.1 % nasal spray, PLACE 2 SPRAYS INTO BOTH NOSTRILS TWICE DAILY, Disp: 30 mL, Rfl: 5   Calcium-Magnesium-Vitamin D (CALCIUM MAGNESIUM PO), Take by mouth., Disp: , Rfl:    cetirizine (ZYRTEC) 10 MG tablet, TAKE (1) TABLET BY MOUTH EVERY DAY, Disp: 30 tablet, Rfl: 5   fluticasone (FLONASE) 50 MCG/ACT nasal spray, PLACE 2 SPRAYS INTO BOTH NOSTRILS EVERY DAY, Disp: 16 g, Rfl: 5   lisinopril (ZESTRIL) 20 MG tablet, TAKE (1) TABLET BY MOUTH EVERY DAY, Disp: 90 tablet, Rfl: 1   Melatonin 10 MG TABS, Take by mouth at bedtime as needed., Disp: , Rfl:    Probiotic Product (PROBIOTIC ADVANCED PO), Take by mouth., Disp: , Rfl:    vitamin C (ASCORBIC ACID) 500  MG tablet, Take 500 mg by mouth 2 (two) times daily., Disp: , Rfl:    VITAMIN D PO, Take by mouth daily., Disp: , Rfl:   Allergies  Allergen Reactions   Codeine Sulfate Nausea And Vomiting   Gluten Meal    Shellfish-Derived Products    Sulfa Antibiotics Nausea And Vomiting   Brassica Oleracea Nausea And Vomiting and Other (See Comments)    Does not digest and vomits up     ROS  As noted in HPI.   Physical Exam  BP (!) 143/79 (BP Location: Left Arm)    Pulse (!) 57    Temp 98.8 F (37.1 C) (Oral)    Resp 18    SpO2 98%   Constitutional: Well developed, well nourished, no acute distress Eyes:  EOMI, conjunctiva normal bilaterally HENT: Normocephalic, atraumatic,mucus membranes  moist.  Positive purulent nasal congestion.  Positive maxillary sinus tenderness.  No frontal sinus tenderness.  Positive postnasal drip. Respiratory: Normal inspiratory effort, lungs clear bilaterally Cardiovascular: Normal rate, regular rhythm, no murmurs, rubs, gallop GI: nondistended skin: No rash, skin intact Musculoskeletal: no deformities Neurologic: Alert & oriented x 3, no focal neuro deficits Psychiatric: Speech and behavior appropriate   ED Course   Medications - No data to display  Orders Placed This Encounter  Procedures   Resp Panel by RT-PCR (Flu A&B, Covid) Nasopharyngeal Swab    Standing Status:   Standing    Number of Occurrences:   1   Airborne and Contact precautions    Standing Status:   Standing    Number of Occurrences:   1    Results for orders placed or performed during the hospital encounter of 07/22/21 (from the past 24 hour(s))  Resp Panel by RT-PCR (Flu A&B, Covid) Nasopharyngeal Swab     Status: None   Collection Time: 07/22/21  9:59 AM   Specimen: Nasopharyngeal Swab; Nasopharyngeal(NP) swabs in vial transport medium  Result Value Ref Range   SARS Coronavirus 2 by RT PCR NEGATIVE NEGATIVE   Influenza A by PCR NEGATIVE NEGATIVE   Influenza B by PCR NEGATIVE NEGATIVE   No results found.  ED Clinical Impression  1. Acute non-recurrent maxillary sinusitis   2. Encounter for laboratory testing for COVID-19 virus      ED Assessment/Plan  Patient with a sinusitis, most likely viral.  Will check a COVID because she will be a candidate for antivirals if COVID is positive.  In the meantime, continue Nettie pot, Mucinex, Vicks, vaporizer, start Flonase, wait-and-see prescription of Augmentin.  Went over indications for filling this.  COVID, influenza negative.  Plan as above.  Discussed labs,MDM, treatment plan, and plan for follow-up with patient. Discussed sn/sx that should prompt return to the ED. patient agrees with plan.   Meds ordered this  encounter  Medications   amoxicillin-clavulanate (AUGMENTIN) 875-125 MG tablet    Sig: Take 1 tablet by mouth 2 (two) times daily. X 7 days    Dispense:  14 tablet    Refill:  0      *This clinic note was created using [employer]. Therefore, there may be occasional mistakes despite careful proofreading.  ?    Melynda Ripple, MD 07/24/21 [phone]

## 2021-07-22 NOTE — Discharge Instructions (Addendum)
continue Nettie pot, Mucinex, Vicks, humidifier, start Flonase, wait-and-see prescription of Augmentin.  I will contact you if and only if your COVID comes back positive.  I will prescribe antivirals if it is positive.  If you do not hear from me by the end of the day, you can assume that it is negative.

## 2021-08-28 LAB — HM MAMMOGRAPHY

## 2021-08-31 LAB — HM COLONOSCOPY

## 2021-09-04 ENCOUNTER — Ambulatory Visit: Admit: 2021-09-04 | Payer: BC Managed Care – PPO | Admitting: Internal Medicine

## 2021-09-04 SURGERY — COLONOSCOPY WITH PROPOFOL
Anesthesia: General

## 2021-10-10 ENCOUNTER — Ambulatory Visit
Admission: EM | Admit: 2021-10-10 | Discharge: 2021-10-10 | Disposition: A | Discharge status: Home / Self Care | Payer: BC Managed Care – PPO

## 2021-10-10 ENCOUNTER — Other Ambulatory Visit: Payer: Self-pay

## 2021-10-10 DIAGNOSIS — R001 Bradycardia, unspecified: Secondary | ICD-10-CM | POA: Diagnosis not present

## 2021-10-10 DIAGNOSIS — R42 Dizziness and giddiness: Secondary | ICD-10-CM

## 2021-10-10 DIAGNOSIS — R0789 Other chest pain: Secondary | ICD-10-CM | POA: Diagnosis not present

## 2021-10-10 NOTE — ED Triage Notes (Signed)
Patient is here for "sinus issues/problems". Yesterday morning woke up fatigued, low HR. Took OTC, Sinus pill, felt some better. Last night around 10pm, started with upper abd pain, vomiting throughout night. Then possibly chest pressure thereafter. No fever. History of significant allergies, symptoms started with season change/warm weather.  ?

## 2021-10-10 NOTE — ED Provider Notes (Signed)
?[facility] ? ? ? ?CSN: [id number] ?Arrival date & time: 10/10/21  9381 ? ? ?  ? ?History   ?Chief Complaint ?Chief Complaint  ?Patient presents with  ? Not Feeling Well  ? Emesis  ? Chest Pain  ? ? ?HPI ?Holly Villegas is a 61 y.o. female.  ? ?Patient presents today with a 12-hour history of general malaise.  Reports that yesterday around 10 PM she started feeling poorly with nausea, fatigue, malaise, chest pressure.  She reports several episodes of emesis after which she felt somewhat improved.  She took over-the-counter medication with improvement of symptoms.  Denies any known sick contacts.  She presents today and continues to have chest pressure that this is minimal with pain rated 2 on a 0-10 pain scale.  Denies any shortness of breath, current lightheadedness, syncopal episode.  She denies any association with activity.  Denies history of CAD but does have risk factors including age, hyperlipidemia, aortic atherosclerosis, hypertension.  She denies history of smoking or strong family history of cardiovascular disease.  Denies any medication changes or other recent illness. ? ? ?Past Medical History:  ?Diagnosis Date  ? Cancer [facility])   ? left sided breast ca  ? Diverticulosis   ? GERD (gastroesophageal reflux disease)   ? Hypercholesteremia   ? Hypertension   ? ? ?Patient Active Problem List  ? Diagnosis Date Noted  ? Aortic atherosclerosis ([place]) 01/19/2021  ? Migraine with aura and without status migrainosus, not intractable 07/09/2019  ? Hx of breast cancer 12/26/2018  ? Lymphedema 02/03/2017  ? Lumbar degenerative disc disease 06/16/2016  ? Bilateral sciatica 06/11/2016  ? History of left breast cancer 03/26/2016  ? Left wrist tendonitis 06/09/2015  ? Plantar fasciitis, bilateral 11/29/2014  ? Vitamin D deficiency 11/29/2014  ? Dupuytren's contracture of foot 11/29/2014  ? Dyslipidemia 11/28/2014  ? Environmental and seasonal allergies 11/28/2014  ? Raynaud's syndrome without gangrene  11/28/2014  ? Carpal tunnel syndrome 11/28/2014  ? Benign colonic polyp 11/28/2014  ? Diverticulosis of colon without diverticulitis 11/28/2014  ? Essential hypertension 11/28/2014  ? Varicose veins of both lower extremities with pain 11/28/2014  ? Raynaud's phenomenon 11/28/2014  ? ? ?Past Surgical History:  ?Procedure Laterality Date  ? BREAST BIOPSY Left 02/24/2016  ? path pending  ? CHOLECYSTECTOMY  2012  ? COLONOSCOPY WITH PROPOFOL N/A 10/13/2015  ? Procedure: COLONOSCOPY WITH PROPOFOL;  Surgeon: Hulen Luster, MD;  Location[facility] ENDOSCOPY;  Service: Gastroenterology;  Laterality: N/A;  ? ESOPHAGEAL DILATION  07/2009  ? THROAT SURGERY  2011  ? polyp removed from vocal cord  ? TUBAL LIGATION  1991  ? ? ?OB History   ?No obstetric history on file. ?  ? ? ? ?Home Medications   ? ?Prior to Admission medications   ?Medication Sig Start Date End Date Taking? Authorizing Provider  ?Ascorbic Acid (VITAMIN C PO) Take by mouth daily.   Yes [provider]  ?aspirin 81 MG chewable tablet Chew 81 mg by mouth daily. 08/29/21  Yes [provider]  ?atorvastatin (LIPITOR) 20 MG tablet Take 1 tablet (20 mg total) by mouth daily. 04/27/21  Yes Glean Hess, MD  ?cetirizine (ZYRTEC) 10 MG tablet TAKE (1) TABLET BY MOUTH EVERY DAY 06/17/15  Yes Glean Hess, MD  ?fluticasone [facility]) 50 MCG/ACT nasal spray PLACE 2 SPRAYS INTO BOTH NOSTRILS EVERY DAY 04/25/21  Yes Glean Hess, MD  ?lisinopril (ZESTRIL) 20 MG tablet TAKE (1) TABLET BY MOUTH EVERY  DAY 04/27/21  Yes Glean Hess, MD  ?Melatonin 10 MG TABS Take by mouth at bedtime as needed.   Yes [provider]  ?Multiple Vitamins-Minerals (ZINC PO) zinc 50 mg capsule ?  1 capsule every day by oral route.   Yes [provider]  ?pantoprazole (PROTONIX) 20 MG tablet Take by mouth. 07/31/21  Yes [provider]  ?Probiotic Product (PROBIOTIC ADVANCED PO) Take by mouth.   Yes [provider]  ?UNABLE TO FIND magnesium  250 mg tablet ?  1 tablet every day by oral route.   Yes [provider]  ?Vitamin A 2400 MCG (8000 UT) CAPS daily.   Yes [provider]  ?vitamin C (ASCORBIC ACID) 500 MG tablet Take 500 mg by mouth 2 (two) times daily.   Yes [provider]  ?Zinc Citrate-Phytase (ZYTAZE) 25-500 MG CAPS daily.   Yes [provider]  ?acetaminophen (TYLENOL) 500 MG tablet Take 1 tablet by mouth once as needed.    [provider]  ?azelastine (ASTELIN) 0.1 % nasal spray PLACE 2 SPRAYS INTO BOTH NOSTRILS TWICE DAILY 04/25/21   Glean Hess, MD  ?Calcium-Magnesium-Vitamin D (CALCIUM MAGNESIUM PO) Take by mouth.    [provider]  ?fluorouracil (EFUDEX) 5 % cream fluorouracil 5 % topical cream    [provider]  ?VITAMIN D PO Take by mouth daily.    [provider]  ? ? ?Family History ?Family History  ?Problem Relation Age of Onset  ? Hypertension Mother   ? Migraines Mother   ? Peptic Ulcer Disease Father   ? Breast cancer Maternal Aunt 77  ? ? ?Social History ?Social History  ? ?Tobacco Use  ? Smoking status: Never  ? Smokeless tobacco: Never  ?Vaping Use  ? Vaping Use: Never used  ?Substance Use Topics  ? Alcohol use: Yes  ?  Alcohol/week: 0.0 standard drinks  ?  Comment: social  ? Drug use: No  ? ? ? ?Allergies   ?Gluten meal, Shellfish allergy, Codeine sulfate, Shellfish-derived products, Sulfa antibiotics, and Brassica oleracea ? ? ?Review of Systems ?Review of Systems  ?Constitutional:  Positive for activity change. Negative for appetite change, fatigue and fever.  ?Respiratory:  Negative for cough and shortness of breath.   ?Cardiovascular:  Positive for chest pain (pressure).  ?Gastrointestinal:  Positive for nausea and vomiting. Negative for abdominal pain and diarrhea.  ?Neurological:  Negative for dizziness, light-headedness and headaches.  ? ? ?Physical Exam ?Triage Vital Signs ?ED Triage Vitals  ?Enc Vitals Group  ?   BP 10/10/21 0856 114/62   ?   Pulse Rate 10/10/21 0856 67  ?   Resp 10/10/21 0856 18  ?   Temp 10/10/21 0856 98.9 ?F (37.2 ?C)  ?   Temp Source 10/10/21 0856 Oral  ?   SpO2 10/10/21 0856 100 %  ?   Weight 10/10/21 0854 174 lb (78.9 kg)  ?   Height 10/10/21 0854 '5\' 4"'$  (1.626 m)  ?   Head Circumference --   ?   Peak Flow --   ?   Pain Score 10/10/21 0850 2  ?   Pain Loc --   ?   Pain Edu? --   ?   Excl. in [place]? --   ? ?No data found. ? ?Updated Vital Signs ?BP 114/62 (BP Location: Left Arm)   Pulse (!) 38   Temp 98.9 ?F (37.2 ?C) (Oral)   Resp 18   Ht '5\' 4"'$  (  1.626 m)   Wt 174 lb (78.9 kg)   SpO2 100%   BMI 29.87 kg/m?  ? ?Visual Acuity ?Right Eye Distance:   ?Left Eye Distance:   ?Bilateral Distance:   ? ?Right Eye Near:   ?Left Eye Near:    ?Bilateral Near:    ? ?Physical Exam ?Vitals reviewed.  ?Constitutional:   ?   General: She is awake. She is not in acute distress. ?   Appearance: Normal appearance. She is well-developed. She is not ill-appearing.  ?   Comments: Very pleasant female appears stated age in no acute distress  ?HENT:  ?   Head: Normocephalic and atraumatic.  ?Cardiovascular:  ?   Rate and Rhythm: Regular rhythm. Bradycardia present.  ?   Heart sounds: S1 normal and S2 normal. Murmur heard.  ?Pulmonary:  ?   Effort: Pulmonary effort is normal.  ?   Breath sounds: Normal breath sounds. No wheezing, rhonchi or rales.  ?   Comments: Clear to auscultation bilaterally  ?Abdominal:  ?   Palpations: Abdomen is soft.  ?   Tenderness: There is no abdominal tenderness.  ?Psychiatric:     ?   Behavior: Behavior is cooperative.  ? ? ? ?UC Treatments / Results  ?Labs ?(all labs ordered are listed, but only abnormal results are displayed) ?Labs Reviewed - No data to display ? ?EKG ? ? ?Radiology ?No results found. ? ?Procedures ?Procedures (including critical care time) ? ?Medications Ordered in UC ?Medications - No data to display ? ?Initial Impression / Assessment and Plan / UC Course  ?I have reviewed the triage vital signs and  the nursing notes. ? ?Pertinent labs & imaging results that were available during my care of the patient were reviewed by me and considered in my medical decision making (see chart for details). ? ?  ? ?EKG obtaine

## 2021-10-10 NOTE — Discharge Instructions (Signed)
Given your heart rate is fluctuating you are having the symptoms it is important that you be checked out at the emergency room.  Since you do not want to go by EMS I do recommend you have someone take you to the ER immediately.  If at any point you develop chest pain, lightheadedness, recurrent nausea/vomiting, shortness of breath you should pull over and call 911. ?

## 2021-10-16 ENCOUNTER — Encounter: Payer: Self-pay | Admitting: Internal Medicine

## 2021-10-16 ENCOUNTER — Ambulatory Visit (INDEPENDENT_AMBULATORY_CARE_PROVIDER_SITE_OTHER): Payer: BC Managed Care – PPO | Admitting: Internal Medicine

## 2021-10-16 VITALS — BP 134/84 | HR 55 | Temp 98.1°F | Ht 64.0 in | Wt 179.0 lb

## 2021-10-16 DIAGNOSIS — R002 Palpitations: Secondary | ICD-10-CM | POA: Diagnosis not present

## 2021-10-16 DIAGNOSIS — J3089 Other allergic rhinitis: Secondary | ICD-10-CM | POA: Diagnosis not present

## 2021-10-16 DIAGNOSIS — J0100 Acute maxillary sinusitis, unspecified: Secondary | ICD-10-CM | POA: Diagnosis not present

## 2021-10-16 MED ORDER — PREDNISONE 10 MG PO TABS: 10.0000 mg | ORAL_TABLET | ORAL | 0 refills | Status: AC

## 2021-10-16 MED ORDER — FLUTICASONE PROPIONATE 50 MCG/ACT NA SUSP: NASAL | 5 refills | Status: DC

## 2021-10-16 MED ORDER — AZELASTINE HCL 0.1 % NA SOLN: NASAL | 5 refills | Status: DC

## 2021-10-16 MED ORDER — AMOXICILLIN-POT CLAVULANATE 875-125 MG PO TABS: 1.0000 | ORAL_TABLET | Freq: Two times a day (BID) | ORAL | 0 refills | Status: AC

## 2021-10-16 NOTE — Progress Notes (Signed)
? ? ?Date:  10/16/2021  ? ?Name:  Holly Villegas   DOB:  August 12, 1960   MRN:  [id number] ? ? ?Chief Complaint: Sinusitis (Covid test- last week, has zio monitor on, has had low hear rate for 1 week ) ? ?Sinus Problem ?This is a new problem. Episode onset: 9 days. The problem has been gradually worsening since onset. There has been no fever. Her pain is at a severity of 4/10. The pain is mild. Associated symptoms include chills, congestion, coughing, headaches, sinus pressure and a sore throat. Pertinent negatives include no shortness of breath. (Tooth pain, numbness of left side of face) Past treatments include acetaminophen, saline sprays and sitting up (OTC medication). The treatment provided mild relief.  ?Palpitations  ?This is a new problem. The problem has been unchanged. Associated symptoms include coughing. Pertinent negatives include no anxiety, chest pain, dizziness, numbness or shortness of breath. She has tried nothing for the symptoms.  ?She was seen originally in UC and had bradycardia so was sent to ER. There she had PVCs but no bradycardia. ? ?Lab Results  ?Component Value Date  ? NA 139 04/27/2021  ? K 4.9 04/27/2021  ? CO2 26 04/27/2021  ? GLUCOSE 111 (H) 04/27/2021  ? BUN 11 04/27/2021  ? CREATININE 0.89 04/27/2021  ? CALCIUM 9.7 04/27/2021  ? EGFR 73 04/27/2021  ? GFRNONAA >60 05/17/2020  ? ?Lab Results  ?Component Value Date  ? CHOL 188 04/27/2021  ? HDL 60 04/27/2021  ? LDLCALC 109 (H) 04/27/2021  ? TRIG 106 04/27/2021  ? CHOLHDL 3.1 04/27/2021  ? ?Lab Results  ?Component Value Date  ? TSH 1.460 04/27/2021  ? ?No results found for: HGBA1C ?Lab Results  ?Component Value Date  ? WBC 5.2 04/27/2021  ? HGB 13.1 04/27/2021  ? HCT 39.1 04/27/2021  ? MCV 88 04/27/2021  ? PLT 306 04/27/2021  ? ?Lab Results  ?Component Value Date  ? ALT 30 04/27/2021  ? AST 33 04/27/2021  ? ALKPHOS 101 04/27/2021  ? BILITOT 0.3 04/27/2021  ? ?Lab Results  ?Component Value Date  ? VD25OH 65.7 12/27/2017  ?  ? ?Review  of Systems  ?Constitutional:  Positive for chills.  ?HENT:  Positive for congestion, sinus pressure and sore throat.   ?Respiratory:  Positive for cough. Negative for chest tightness, shortness of breath and wheezing.   ?Cardiovascular:  Positive for palpitations. Negative for chest pain.  ?Neurological:  Positive for headaches. Negative for dizziness, syncope, light-headedness and numbness.  ?Psychiatric/Behavioral:  Negative for dysphoric mood and sleep disturbance. The patient is not nervous/anxious.   ? ?Patient Active Problem List  ? Diagnosis Date Noted  ? Aortic atherosclerosis ([place]) 01/19/2021  ? Migraine with aura and without status migrainosus, not intractable 07/09/2019  ? Hx of breast cancer 12/26/2018  ? Lymphedema 02/03/2017  ? Lumbar degenerative disc disease 06/16/2016  ? Bilateral sciatica 06/11/2016  ? History of left breast cancer 03/26/2016  ? Left wrist tendonitis 06/09/2015  ? Plantar fasciitis, bilateral 11/29/2014  ? Vitamin D deficiency 11/29/2014  ? Dupuytren's contracture of foot 11/29/2014  ? Dyslipidemia 11/28/2014  ? Environmental and seasonal allergies 11/28/2014  ? Raynaud's syndrome without gangrene 11/28/2014  ? Carpal tunnel syndrome 11/28/2014  ? Benign colonic polyp 11/28/2014  ? Diverticulosis of colon without diverticulitis 11/28/2014  ? Essential hypertension 11/28/2014  ? Varicose veins of both lower extremities with pain 11/28/2014  ? Raynaud's phenomenon 11/28/2014  ? ? ?Allergies  ?Allergen Reactions  ? Gluten Meal   ?  Other reaction(s): Abdominal Pain  ? Shellfish Allergy   ?  Other reaction(s): Vomiting  ? Codeine Sulfate Nausea And Vomiting  ? Shellfish-Derived Products   ? Sulfa Antibiotics Nausea And Vomiting  ? Brassica Oleracea Nausea And Vomiting and Other (See Comments)  ?  Does not digest and vomits up  ? ? ?Past Surgical History:  ?Procedure Laterality Date  ? BREAST BIOPSY Left 02/24/2016  ? path pending  ? CHOLECYSTECTOMY  2012  ? COLONOSCOPY WITH PROPOFOL  N/A 10/13/2015  ? Procedure: COLONOSCOPY WITH PROPOFOL;  Surgeon: Hulen Luster, MD;  Location: [facility] ENDOSCOPY;  Service: Gastroenterology;  Laterality: N/A;  ? ESOPHAGEAL DILATION  07/2009  ? THROAT SURGERY  2011  ? polyp removed from vocal cord  ? TUBAL LIGATION  1991  ? ? ?Social History  ? ?Tobacco Use  ? Smoking status: Never  ? Smokeless tobacco: Never  ?Vaping Use  ? Vaping Use: Never used  ?Substance Use Topics  ? Alcohol use: Yes  ?  Alcohol/week: 0.0 standard drinks  ?  Comment: social  ? Drug use: No  ? ? ? ?Medication list has been reviewed and updated. ? ?Current Meds  ?Medication Sig  ? amoxicillin-clavulanate (AUGMENTIN) 875-125 MG tablet Take 1 tablet by mouth 2 (two) times daily for 10 days.  ? predniSONE (DELTASONE) 10 MG tablet Take 1 tablet (10 mg total) by mouth as directed for 6 days. Take 6,5,4,3,2,1 then stop  ? ? ? ?  10/16/2021  ?  1:31 PM 04/27/2021  ?  9:41 AM 07/22/2020  ?  9:42 AM 10/24/2019  ?  8:25 AM  ?GAD 7 : Generalized Anxiety Score  ?Nervous, Anxious, on Edge 0 0 0 0  ?Control/stop worrying 0 0 0 0  ?Worry too much - different things 0 0 0 0  ?Trouble relaxing 0 0 0 0  ?Restless 0 0 0 0  ?Easily annoyed or irritable 0 0 0 0  ?Afraid - awful might happen 0 0 0 0  ?Total GAD 7 Score 0 0 0 0  ?Anxiety Difficulty  Not difficult at all  Not difficult at all  ? ? ? ?  10/16/2021  ?  1:31 PM  ?Depression screen PHQ 2/9  ?Decreased Interest 0  ?Down, Depressed, Hopeless 0  ?PHQ - 2 Score 0  ?Altered sleeping 0  ?Tired, decreased energy 0  ?Change in appetite 0  ?Feeling bad or failure about yourself  0  ?Trouble concentrating 0  ?Moving slowly or fidgety/restless 0  ?Suicidal thoughts 0  ?PHQ-9 Score 0  ?Difficult doing work/chores Not difficult at all  ? ? ?BP Readings from Last 3 Encounters:  ?10/16/21 134/84  ?10/10/21 114/62  ?07/22/21 (!) 143/79  ? ? ?Physical Exam ?Vitals and nursing note reviewed.  ?Constitutional:   ?   General: She is not in acute distress. ?   Appearance: Normal  appearance. She is well-developed.  ?HENT:  ?   Head: Normocephalic and atraumatic.  ?   Right Ear: Ear canal and external ear normal. Tympanic membrane is not erythematous or retracted.  ?   Left Ear: Ear canal and external ear normal. Tympanic membrane is not erythematous or retracted.  ?   Nose:  ?   Right Sinus: Maxillary sinus tenderness present. No frontal sinus tenderness.  ?   Left Sinus: Maxillary sinus tenderness and frontal sinus tenderness present.  ?   Mouth/Throat:  ?   Mouth: No oral lesions.  ?   Pharynx: Uvula  midline. No oropharyngeal exudate or posterior oropharyngeal erythema.  ?Cardiovascular:  ?   Rate and Rhythm: Regular rhythm. Bradycardia present.  ?   Heart sounds: Normal heart sounds.  ?Pulmonary:  ?   Effort: Pulmonary effort is normal. No respiratory distress.  ?   Breath sounds: Normal breath sounds. No decreased breath sounds, wheezing or rales.  ?Musculoskeletal:  ?   Cervical back: Normal range of motion.  ?Lymphadenopathy:  ?   Cervical: No cervical adenopathy.  ?Skin: ?   General: Skin is warm and dry.  ?   Findings: No rash.  ?Neurological:  ?   Mental Status: She is alert and oriented to person, place, and time.  ?Psychiatric:     ?   Mood and Affect: Mood normal.     ?   Behavior: Behavior normal.  ? ? ?Wt Readings from Last 3 Encounters:  ?10/16/21 179 lb (81.2 kg)  ?10/10/21 174 lb (78.9 kg)  ?05/21/21 177 lb 14.6 oz (80.7 kg)  ? ? ?BP 134/84   Pulse (!) 55   Temp 98.1 ?F (36.7 ?C) (Oral)   Ht '5\' 4"'  (1.626 m)   Wt 179 lb (81.2 kg)   SpO2 98%   BMI 30.73 kg/m?  ? ?Assessment and Plan: ?1. Acute non-recurrent maxillary sinusitis ?Continue flonase and astelin ?Use steroid taper if needed ?- amoxicillin-clavulanate (AUGMENTIN) 875-125 MG tablet; Take 1 tablet by mouth 2 (two) times daily for 10 days.  Dispense: 20 tablet; Refill: 0 ?- predniSONE (DELTASONE) 10 MG tablet; Take 1 tablet (10 mg total) by mouth as directed for 6 days. Take 6,5,4,3,2,1 then stop  Dispense: 21  tablet; Refill: 0 ? ?2. Heart palpitations ?Wearing a Zio monitor now ?Has follow up with Dr. Nehemiah Massed in 4 weeks ?Recommend ED if she become symptomatic again ? ?3. Environmental and seasonal allergies ?- azelastine (AST

## 2021-10-27 ENCOUNTER — Ambulatory Visit (INDEPENDENT_AMBULATORY_CARE_PROVIDER_SITE_OTHER): Payer: BC Managed Care – PPO | Admitting: Internal Medicine

## 2021-10-27 ENCOUNTER — Encounter: Payer: Self-pay | Admitting: Internal Medicine

## 2021-10-27 VITALS — BP 134/82 | HR 52 | Ht 64.0 in | Wt 180.0 lb

## 2021-10-27 DIAGNOSIS — I83813 Varicose veins of bilateral lower extremities with pain: Secondary | ICD-10-CM | POA: Diagnosis not present

## 2021-10-27 DIAGNOSIS — I493 Ventricular premature depolarization: Secondary | ICD-10-CM | POA: Diagnosis not present

## 2021-10-27 DIAGNOSIS — I700 Atherosclerosis of aorta: Secondary | ICD-10-CM

## 2021-10-27 DIAGNOSIS — I10 Essential (primary) hypertension: Secondary | ICD-10-CM | POA: Diagnosis not present

## 2021-10-27 MED ORDER — LISINOPRIL 20 MG PO TABS: 20.0000 mg | ORAL_TABLET | Freq: Every day | ORAL | 1 refills | Status: DC

## 2021-10-27 MED ORDER — ATORVASTATIN CALCIUM 20 MG PO TABS: 20.0000 mg | ORAL_TABLET | Freq: Every day | ORAL | 1 refills | Status: DC

## 2021-10-27 NOTE — Progress Notes (Signed)
Date:  10/27/2021   Name:  Holly Villegas   DOB:  07-09-1960   MRN:  [id number]   Chief Complaint: Hypertension  Hypertension This is a chronic problem. The problem is controlled. Pertinent negatives include no chest pain, headaches, palpitations or shortness of breath. Past treatments include ACE inhibitors. The current treatment provides significant improvement.  Hyperlipidemia This is a chronic problem. The problem is controlled. Associated symptoms include myalgias (varicose veins). Pertinent negatives include no chest pain or shortness of breath. Current antihyperlipidemic treatment includes statins. The current treatment provides significant improvement of lipids.   Lab Results  Component Value Date   NA 139 04/27/2021   K 4.9 04/27/2021   CO2 26 04/27/2021   GLUCOSE 111 (H) 04/27/2021   BUN 11 04/27/2021   CREATININE 0.89 04/27/2021   CALCIUM 9.7 04/27/2021   EGFR 73 04/27/2021   GFRNONAA >60 05/17/2020   Lab Results  Component Value Date   CHOL 188 04/27/2021   HDL 60 04/27/2021   LDLCALC 109 (H) 04/27/2021   TRIG 106 04/27/2021   CHOLHDL 3.1 04/27/2021   Lab Results  Component Value Date   TSH 1.460 04/27/2021   No results found for: HGBA1C Lab Results  Component Value Date   WBC 5.2 04/27/2021   HGB 13.1 04/27/2021   HCT 39.1 04/27/2021   MCV 88 04/27/2021   PLT 306 04/27/2021   Lab Results  Component Value Date   ALT 30 04/27/2021   AST 33 04/27/2021   ALKPHOS 101 04/27/2021   BILITOT 0.3 04/27/2021   Lab Results  Component Value Date   VD25OH 65.7 12/27/2017     Review of Systems  Constitutional:  Negative for fatigue and unexpected weight change.  HENT:  Negative for nosebleeds.   Eyes:  Negative for visual disturbance.  Respiratory:  Negative for cough, chest tightness, shortness of breath and wheezing.   Cardiovascular:  Negative for chest pain, palpitations and leg swelling.  Gastrointestinal:  Negative for abdominal pain,  constipation and diarrhea.  Musculoskeletal:  Positive for myalgias (varicose veins).  Neurological:  Negative for dizziness, weakness, light-headedness and headaches.  Psychiatric/Behavioral:  Negative for dysphoric mood and sleep disturbance. The patient is not nervous/anxious.    Patient Active Problem List   Diagnosis Date Noted   PVC's (premature ventricular contractions) 10/27/2021   Aortic atherosclerosis ([facility]) 01/19/2021   Migraine with aura and without status migrainosus, not intractable 07/09/2019   Hx of breast cancer 12/26/2018   Lymphedema 02/03/2017   Lumbar degenerative disc disease 06/16/2016   Bilateral sciatica 06/11/2016   History of left breast cancer 03/26/2016   Left wrist tendonitis 06/09/2015   Plantar fasciitis, bilateral 11/29/2014   Vitamin D deficiency 11/29/2014   Dupuytren's contracture of foot 11/29/2014   Dyslipidemia 11/28/2014   Environmental and seasonal allergies 11/28/2014   Raynaud's syndrome without gangrene 11/28/2014   Carpal tunnel syndrome 11/28/2014   Benign colonic polyp 11/28/2014   Diverticulosis of colon without diverticulitis 11/28/2014   Essential hypertension 11/28/2014   Varicose veins of both lower extremities with pain 11/28/2014   Raynaud's phenomenon 11/28/2014    Allergies  Allergen Reactions   Gluten Meal     Other reaction(s): Abdominal Pain   Shellfish Allergy     Other reaction(s): Vomiting   Codeine Sulfate Nausea And Vomiting   Shellfish-Derived Products    Sulfa Antibiotics Nausea And Vomiting   Brassica Oleracea Nausea And Vomiting and Other (See Comments)    Does not digest and  vomits up    Past Surgical History:  Procedure Laterality Date   BREAST BIOPSY Left 02/24/2016   path pending   CHOLECYSTECTOMY  2012   COLONOSCOPY WITH PROPOFOL N/A 10/13/2015   Procedure: COLONOSCOPY WITH PROPOFOL;  Surgeon: Wallace Cullens, MD;  Location: [facility] ENDOSCOPY;  Service: Gastroenterology;  Laterality: N/A;   ESOPHAGEAL  DILATION  07/2009   THROAT SURGERY  2011   polyp removed from vocal cord   TUBAL LIGATION  1991    Social History   Tobacco Use   Smoking status: Never   Smokeless tobacco: Never  Vaping Use   Vaping Use: Never used  Substance Use Topics   Alcohol use: Yes    Alcohol/week: 0.0 standard drinks    Comment: social   Drug use: No     Medication list has been reviewed and updated.  Current Meds  Medication Sig   acetaminophen (TYLENOL) 500 MG tablet Take 1 tablet by mouth once as needed.   Ascorbic Acid (VITAMIN C PO) Take by mouth daily.   aspirin 81 MG chewable tablet Chew 81 mg by mouth daily.   atorvastatin (LIPITOR) 20 MG tablet Take 1 tablet (20 mg total) by mouth daily.   azelastine (ASTELIN) 0.1 % nasal spray PLACE 2 SPRAYS INTO BOTH NOSTRILS TWICE DAILY   Calcium-Magnesium-Vitamin D (CALCIUM MAGNESIUM PO) Take by mouth.   cetirizine (ZYRTEC) 10 MG tablet TAKE (1) TABLET BY MOUTH EVERY DAY   fluticasone (FLONASE) 50 MCG/ACT nasal spray PLACE 2 SPRAYS INTO BOTH NOSTRILS EVERY DAY   lisinopril (ZESTRIL) 20 MG tablet TAKE (1) TABLET BY MOUTH EVERY DAY   Melatonin 10 MG TABS Take by mouth at bedtime as needed.   Multiple Vitamins-Minerals (ZINC PO) zinc 50 mg capsule   1 capsule every day by oral route.   pantoprazole (PROTONIX) 20 MG tablet Take by mouth.   Probiotic Product (PROBIOTIC ADVANCED PO) Take by mouth.   UNABLE TO FIND magnesium 250 mg tablet   1 tablet every day by oral route.   Vitamin A 2400 MCG (8000 UT) CAPS daily.   vitamin C (ASCORBIC ACID) 500 MG tablet Take 500 mg by mouth 2 (two) times daily.   VITAMIN D PO Take by mouth daily.   Zinc Citrate-Phytase (ZYTAZE) 25-500 MG CAPS daily.       10/27/2021    9:02 AM 10/16/2021    1:31 PM 04/27/2021    9:41 AM 07/22/2020    9:42 AM  GAD 7 : Generalized Anxiety Score  Nervous, Anxious, on Edge 0 0 0 0  Control/stop worrying 0 0 0 0  Worry too much - different things 0 0 0 0  Trouble relaxing 0 0 0 0   Restless 0 0 0 0  Easily annoyed or irritable 0 0 0 0  Afraid - awful might happen 0 0 0 0  Total GAD 7 Score 0 0 0 0  Anxiety Difficulty Not difficult at all  Not difficult at all        10/27/2021    9:02 AM  Depression screen PHQ 2/9  Decreased Interest 0  Down, Depressed, Hopeless 0  PHQ - 2 Score 0  Altered sleeping 0  Tired, decreased energy 0  Change in appetite 1  Feeling bad or failure about yourself  0  Trouble concentrating 0  Moving slowly or fidgety/restless 0  Suicidal thoughts 0  PHQ-9 Score 1  Difficult doing work/chores Not difficult at all    BP Readings from  Last 3 Encounters:  10/27/21 134/82  10/16/21 134/84  10/10/21 114/62    Physical Exam Vitals and nursing note reviewed.  Constitutional:      General: She is not in acute distress.    Appearance: She is well-developed.  HENT:     Head: Normocephalic and atraumatic.  Cardiovascular:     Rate and Rhythm: Normal rate and regular rhythm.     Heart sounds: No murmur heard. Pulmonary:     Effort: Pulmonary effort is normal. No respiratory distress.     Breath sounds: No wheezing or rhonchi.  Musculoskeletal:     Cervical back: Normal range of motion.     Right lower leg: No edema.     Left lower leg: No edema.  Lymphadenopathy:     Cervical: No cervical adenopathy.  Skin:    General: Skin is warm and dry.     Capillary Refill: Capillary refill takes less than 2 seconds.     Findings: No rash.  Neurological:     General: No focal deficit present.     Mental Status: She is alert and oriented to person, place, and time.  Psychiatric:        Mood and Affect: Mood normal.        Behavior: Behavior normal.    Wt Readings from Last 3 Encounters:  10/27/21 180 lb (81.6 kg)  10/16/21 179 lb (81.2 kg)  10/10/21 174 lb (78.9 kg)    BP 134/82   Pulse (!) 52   Ht 5\' 4"  (1.626 m)   Wt 180 lb (81.6 kg)   SpO2 98%   BMI 30.90 kg/m   Assessment and Plan: 1. Essential  hypertension Clinically stable exam with well controlled BP. Tolerating medications without side effects at this time. Pt to continue current regimen and low sodium diet; benefits of regular exercise as able discussed. - lisinopril (ZESTRIL) 20 MG tablet; Take 1 tablet (20 mg total) by mouth daily.  Dispense: 90 tablet; Refill: 1  2. PVC's (premature ventricular contractions) Completed Zio monitor thru [facility] Will follow up with cardiology at the end of the month  3. Aortic atherosclerosis (HCC) On statin therapy without side effects - atorvastatin (LIPITOR) 20 MG tablet; Take 1 tablet (20 mg total) by mouth daily.  Dispense: 90 tablet; Refill: 1  4. Varicose veins of both lower extremities with pain Compliant with compression hose daily Follow up with [facility]    Partially dictated using Animal nutritionist. Any errors are unintentional.  Bari Edward, MD [facility]  10/27/2021

## 2021-12-09 ENCOUNTER — Encounter: Payer: Self-pay | Admitting: Internal Medicine

## 2021-12-09 ENCOUNTER — Ambulatory Visit: Payer: BC Managed Care – PPO | Admitting: Internal Medicine

## 2021-12-09 ENCOUNTER — Ambulatory Visit: Payer: Self-pay | Admitting: *Deleted

## 2021-12-09 VITALS — BP 136/82 | HR 52 | Ht 64.0 in | Wt 183.0 lb

## 2021-12-09 DIAGNOSIS — L247 Irritant contact dermatitis due to plants, except food: Secondary | ICD-10-CM | POA: Diagnosis not present

## 2021-12-09 MED ORDER — PREDNISONE 10 MG PO TABS: ORAL_TABLET | ORAL | 0 refills | Status: AC

## 2021-12-09 NOTE — Progress Notes (Signed)
Date:  12/09/2021   Name:  Holly Villegas   DOB:  01-26-1961   MRN:  [id number]   Chief Complaint: Rash (Spreading from right arm to neck and breast, and stomach. Started Saturday June 17th. Tried calamine and benadryl, but no relief. )  Rash This is a new problem. The current episode started in the past 7 days. The problem has been gradually worsening since onset. The affected locations include the abdomen, chest and right arm. The rash is characterized by blistering, itchiness and redness. She was exposed to plant contact. Pertinent negatives include no fatigue, fever or shortness of breath.    Lab Results  Component Value Date   NA 139 04/27/2021   K 4.9 04/27/2021   CO2 26 04/27/2021   GLUCOSE 111 (H) 04/27/2021   BUN 11 04/27/2021   CREATININE 0.89 04/27/2021   CALCIUM 9.7 04/27/2021   EGFR 73 04/27/2021   GFRNONAA >60 05/17/2020   Lab Results  Component Value Date   CHOL 188 04/27/2021   HDL 60 04/27/2021   LDLCALC 109 (H) 04/27/2021   TRIG 106 04/27/2021   CHOLHDL 3.1 04/27/2021   Lab Results  Component Value Date   TSH 1.460 04/27/2021   No results found for: "HGBA1C" Lab Results  Component Value Date   WBC 5.2 04/27/2021   HGB 13.1 04/27/2021   HCT 39.1 04/27/2021   MCV 88 04/27/2021   PLT 306 04/27/2021   Lab Results  Component Value Date   ALT 30 04/27/2021   AST 33 04/27/2021   ALKPHOS 101 04/27/2021   BILITOT 0.3 04/27/2021   Lab Results  Component Value Date   VD25OH 65.7 12/27/2017     Review of Systems  Constitutional:  Negative for appetite change, fatigue and fever.  Respiratory:  Negative for chest tightness and shortness of breath.   Cardiovascular:  Negative for chest pain.  Skin:  Positive for rash.  Psychiatric/Behavioral:  Negative for dysphoric mood and sleep disturbance. The patient is not nervous/anxious.     Patient Active Problem List   Diagnosis Date Noted   PVC's (premature ventricular contractions) 10/27/2021    Aortic atherosclerosis ([place]) 01/19/2021   Migraine with aura and without status migrainosus, not intractable 07/09/2019   Hx of breast cancer 12/26/2018   Lymphedema 02/03/2017   Lumbar degenerative disc disease 06/16/2016   Bilateral sciatica 06/11/2016   History of left breast cancer 03/26/2016   Left wrist tendonitis 06/09/2015   Plantar fasciitis, bilateral 11/29/2014   Vitamin D deficiency 11/29/2014   Dupuytren's contracture of foot 11/29/2014   Dyslipidemia 11/28/2014   Environmental and seasonal allergies 11/28/2014   Raynaud's syndrome without gangrene 11/28/2014   Carpal tunnel syndrome 11/28/2014   Benign colonic polyp 11/28/2014   Diverticulosis of colon without diverticulitis 11/28/2014   Essential hypertension 11/28/2014   Varicose veins of both lower extremities with pain 11/28/2014   Raynaud's phenomenon 11/28/2014    Allergies  Allergen Reactions   Gluten Meal     Other reaction(s): Abdominal Pain   Shellfish Allergy     Other reaction(s): Vomiting   Codeine Sulfate Nausea And Vomiting   Shellfish-Derived Products    Sulfa Antibiotics Nausea And Vomiting   Brassica Oleracea Nausea And Vomiting and Other (See Comments)    Does not digest and vomits up    Past Surgical History:  Procedure Laterality Date   BREAST BIOPSY Left 02/24/2016   path pending   CHOLECYSTECTOMY  2012   COLONOSCOPY WITH PROPOFOL N/A  10/13/2015   Procedure: COLONOSCOPY WITH PROPOFOL;  Surgeon: Hulen Luster, MD;  Location: [facility] ENDOSCOPY;  Service: Gastroenterology;  Laterality: N/A;   ESOPHAGEAL DILATION  07/2009   THROAT SURGERY  2011   polyp removed from vocal cord   TUBAL LIGATION  1991    Social History   Tobacco Use   Smoking status: Never   Smokeless tobacco: Never  Vaping Use   Vaping Use: Never used  Substance Use Topics   Alcohol use: Yes    Alcohol/week: 0.0 standard drinks of alcohol    Comment: social   Drug use: No     Medication list has been reviewed and  updated.  Current Meds  Medication Sig   predniSONE (DELTASONE) 10 MG tablet Take 6 on day 1and 2, 5 on day 3 and 4, 4 on day 5 and 6 , 3 on day 7 and 8, 2 on day 9 and 10 and 1 on day 11 and 12 then stop.       10/27/2021    9:02 AM 10/16/2021    1:31 PM 04/27/2021    9:41 AM 07/22/2020    9:42 AM  GAD 7 : Generalized Anxiety Score  Nervous, Anxious, on Edge 0 0 0 0  Control/stop worrying 0 0 0 0  Worry too much - different things 0 0 0 0  Trouble relaxing 0 0 0 0  Restless 0 0 0 0  Easily annoyed or irritable 0 0 0 0  Afraid - awful might happen 0 0 0 0  Total GAD 7 Score 0 0 0 0  Anxiety Difficulty Not difficult at all  Not difficult at all        10/27/2021    9:02 AM  Depression screen PHQ 2/9  Decreased Interest 0  Down, Depressed, Hopeless 0  PHQ - 2 Score 0  Altered sleeping 0  Tired, decreased energy 0  Change in appetite 1  Feeling bad or failure about yourself  0  Trouble concentrating 0  Moving slowly or fidgety/restless 0  Suicidal thoughts 0  PHQ-9 Score 1  Difficult doing work/chores Not difficult at all    BP Readings from Last 3 Encounters:  12/09/21 136/82  10/27/21 134/82  10/16/21 134/84    Physical Exam Vitals and nursing note reviewed.  Constitutional:      General: She is not in acute distress.    Appearance: Normal appearance. She is well-developed.  HENT:     Head: Normocephalic and atraumatic.  Cardiovascular:     Rate and Rhythm: Normal rate and regular rhythm.  Pulmonary:     Effort: Pulmonary effort is normal. No respiratory distress.  Musculoskeletal:     Cervical back: Normal range of motion.  Skin:    General: Skin is warm and dry.     Findings: Rash present.     Comments: Linear blistering rash on right arm, both breasts, abdomen and neck.  Neurological:     Mental Status: She is alert and oriented to person, place, and time.  Psychiatric:        Mood and Affect: Mood normal.        Behavior: Behavior normal.     Wt  Readings from Last 3 Encounters:  12/09/21 183 lb (83 kg)  10/27/21 180 lb (81.6 kg)  10/16/21 179 lb (81.2 kg)    BP 136/82   Pulse (!) 52   Ht _0  (1.626 m)   Wt 183 lb (83 kg)  SpO2 99%   BMI 31.41 kg/m   Assessment and Plan: 1. Irritant contact dermatitis due to plants, except food Recommend Benadryl qid, Zyrtec qd and Pepcid bid Topical cortisone as needed - predniSONE (DELTASONE) 10 MG tablet; Take 6 on day 1and 2, 5 on day 3 and 4, 4 on day 5 and 6 , 3 on day 7 and 8, 2 on day 9 and 10 and 1 on day 11 and 12 then stop.  Dispense: 42 tablet; Refill: 0   Partially dictated using Editor, commissioning. Any errors are unintentional.  Halina Maidens, MD [facility]  12/09/2021

## 2021-12-09 NOTE — Telephone Encounter (Signed)
Son ivy Reason for Disposition  [1] Skin around the rash has become red AND [2] larger than 2 inches (5 cm)  Answer Assessment - Initial Assessment Questions 1. APPEARANCE of RASH: "Describe the rash."      Red, whelps,raised 2. LOCATION: "Where is the rash located?"  (e.g., face, genitals, hands, legs)     6-7 places- breast, side,arms 3. SIZE: "How large is the rash?"      Arm- 2" long 4. ONSET: "When did the rash begin?"      Saturday 5. ITCHING: "Does the rash itch?" If Yes, ask: "How bad is it?"   - MILD - doesn't interfere with normal activities   - MODERATE-SEVERE: interferes with work, school, sleep, or other activities      moderate 6. EXPOSURE:  "How were you exposed to the plant (poison ivy, poison oak, sumac)"  "When were you exposed?"       Poison oak exposure 7. PAST HISTORY: "Have you had a poison ivy rash before?" If Yes, ask: "How bad was it?"     Yes- ha to have treatment 8. PREGNANCY: "Is there any chance you are pregnant?" "When was your last menstrual period?"  Protocols used: [place] [facility]

## 2021-12-09 NOTE — Patient Instructions (Signed)
Take for itching:  Zyrtec daily  Benadryl 25 mg four times per day  Pepcid 10-20 mg twice a day

## 2021-12-09 NOTE — Telephone Encounter (Signed)
  Chief Complaint: poison oak Symptoms: itching, rash Frequency: started Saturday  Pertinent Negatives: Patient denies   Disposition: '[]'$ ED /'[]'$ Urgent Care (no appt availability in office) / '[x]'$ Appointment(In office/virtual)/ '[]'$  [place] [facility]/ '[]'$ Home Care/ '[]'$ Refused Recommended Disposition /'[]'$ [place] [facility]/ '[]'$  Follow-up with PCP Additional Notes: Call to office- Estill Bamberg helped schedule appointment

## 2022-01-02 IMAGING — CT CT CARDIAC CORONARY ARTERY CALCIUM SCORE
3 series · 14 of 20 positions shown, 16 images · non-contrast
Comparison: None.
COMPARISON: None.

Addendum:
EXAM:
OVER-READ INTERPRETATION  CT CHEST

The following report is an over-read performed by radiologist Dr.
Noorita Malka [REDACTED] on 11/20/2020. This
over-read does not include interpretation of cardiac or coronary
anatomy or pathology. The coronary calcium score interpretation by
the cardiologist is attached.
CLINICAL DATA: Cardiovascular Disease Risk stratification
Coronary Calcium Score
TECHNIQUE: A gated, non-contrast computed tomography scan of the heart was
performed using 3mm slice thickness. Axial images were analyzed on a
dedicated workstation. Calcium scoring of the coronary arteries was
performed using the Agatston method.

[Series 2: sa36 calcium scoring 3.00 · axial · 0.32mm/px · z∈[-1097,-1016]mm · 4 of 46 slices shown]
[im 10/46  vessel]
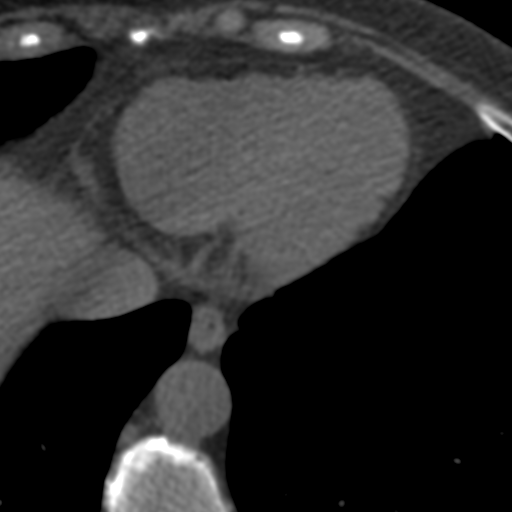
[im 19/46  vessel]
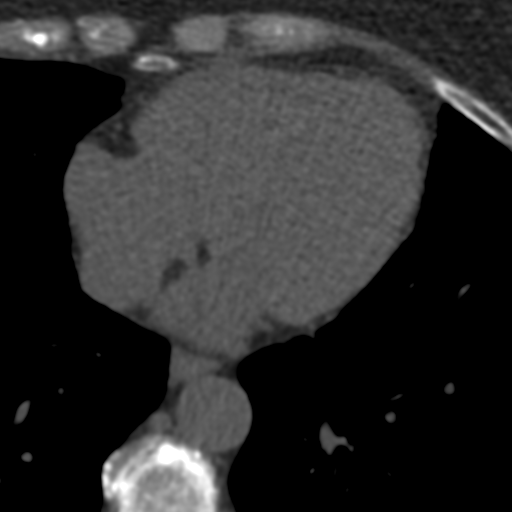
[im 28/46  vessel]
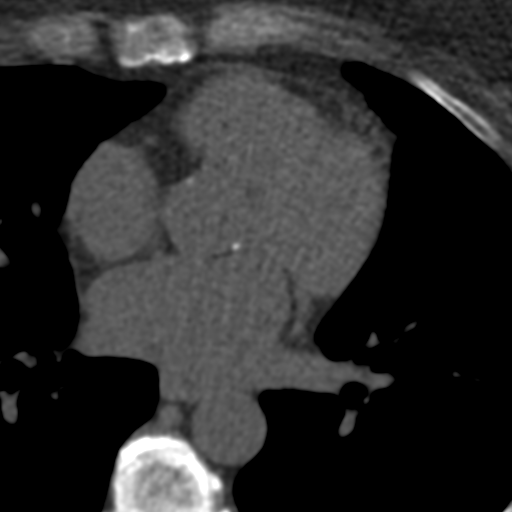
[im 37/46  vessel]
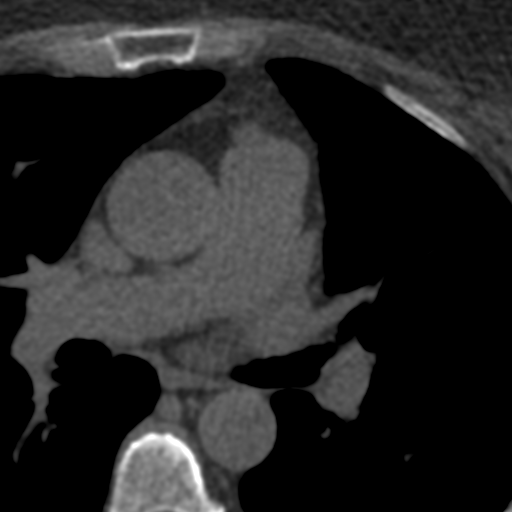

[Series 5: full fov st calcium scoring 3.00 · axial · 0.76mm/px · z∈[-1103,-1013]mm · 5 of 46 slices shown, 7 images]
[im 8/46  vessel]
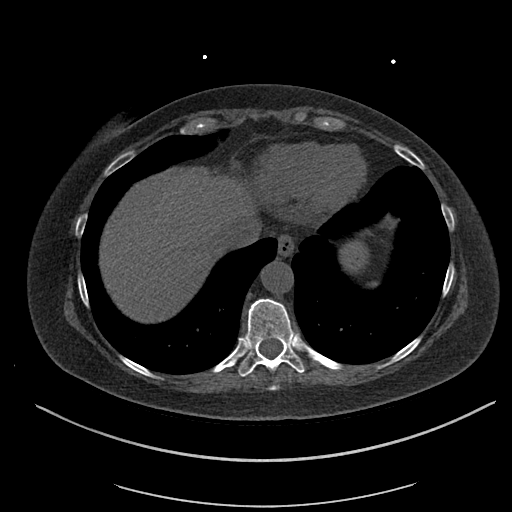
[im 8/46  lung]
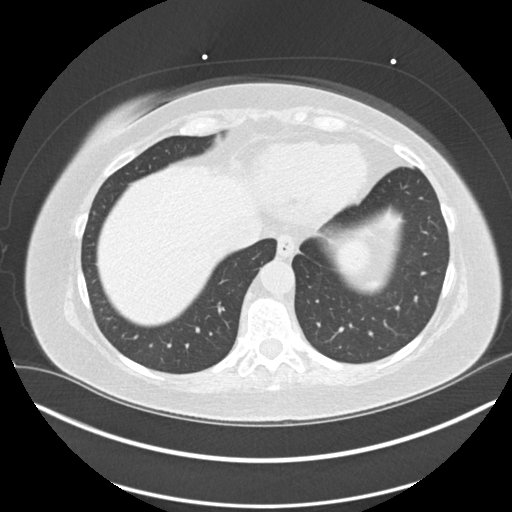
[im 16/46  vessel]
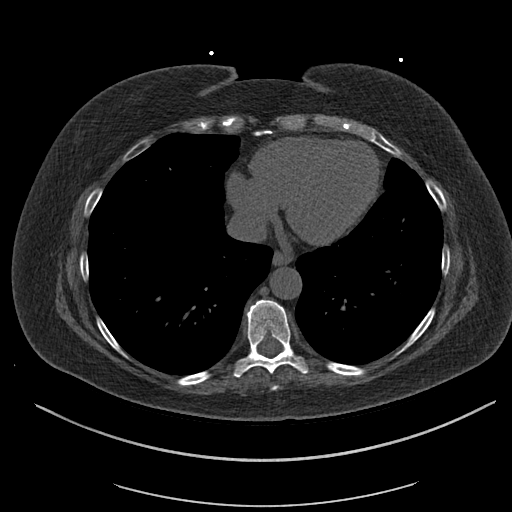
[im 23/46  vessel]
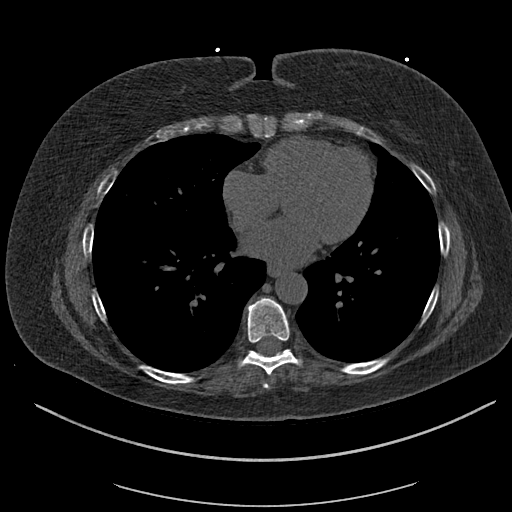
[im 31/46  vessel]
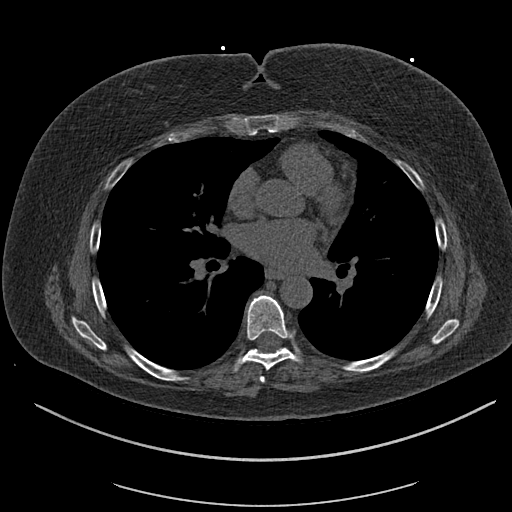
[im 38/46  vessel]
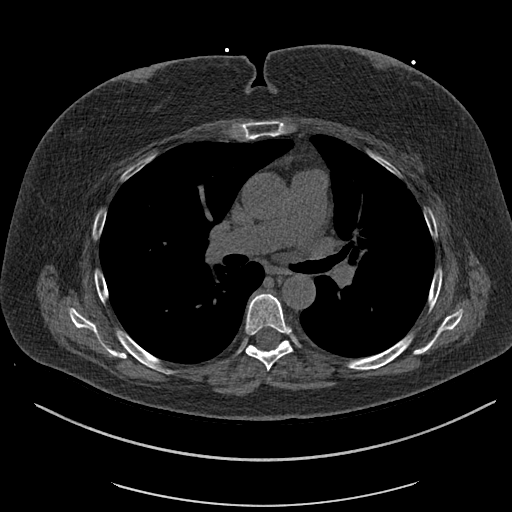
[im 38/46  lung]
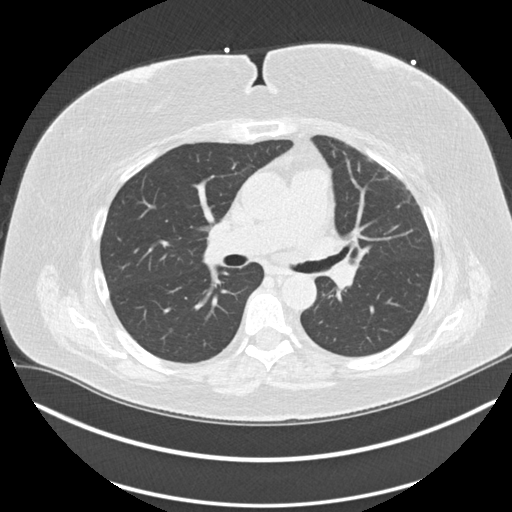

[Series 10: full fov lungs calcium scoring 3.00 ax · axial · 0.76mm/px · z∈[-1103,-1013]mm · 5 of 46 slices shown]
[im 8/46  vessel]
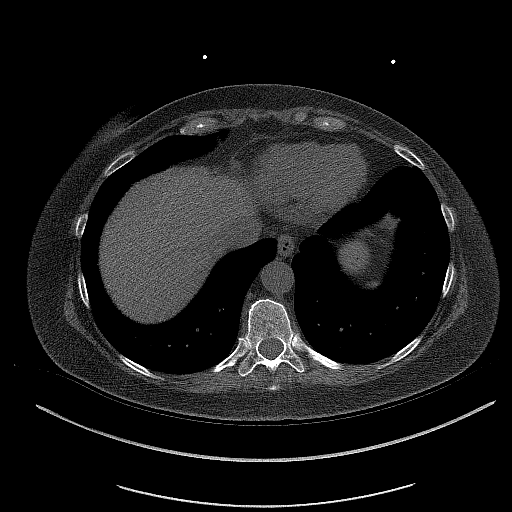
[im 16/46  vessel]
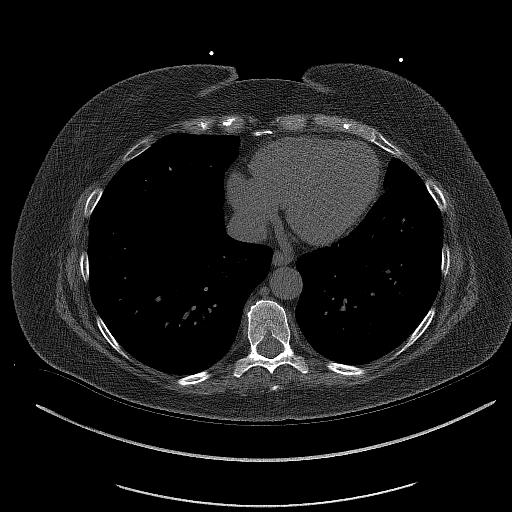
[im 23/46  vessel]
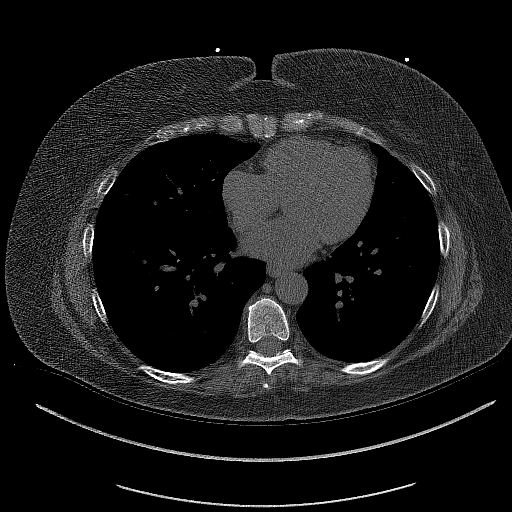
[im 31/46  vessel]
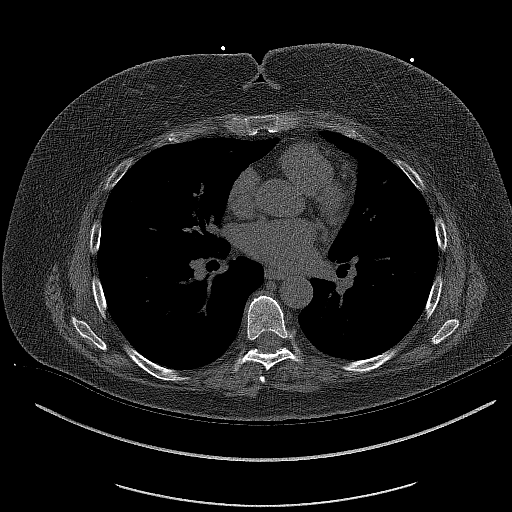
[im 38/46  vessel]
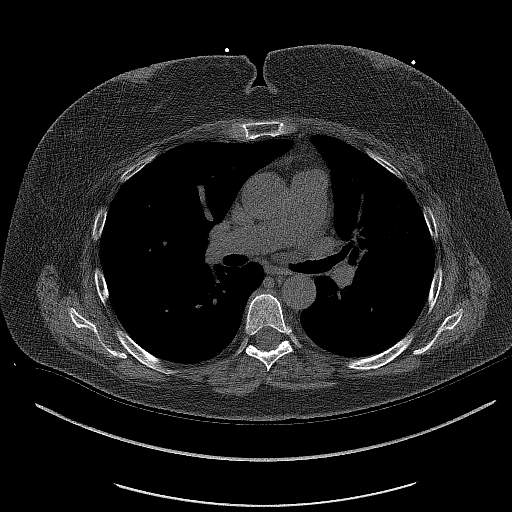

[14 of 20 positions shown; findings below may reference images not displayed]

FINDINGS: Atherosclerotic calcifications in the thoracic aorta. Within the
visualized portions of the thorax there are no suspicious appearing
pulmonary nodules or masses, there is no acute consolidative
airspace disease, no pleural effusions, no pneumothorax and no
lymphadenopathy. Visualized portions of the upper abdomen are
unremarkable. There are no aggressive appearing lytic or blastic
lesions noted in the visualized portions of the skeleton.
IMPRESSION: 1.  Aortic Atherosclerosis (KD535-3RM.M).
FINDINGS: Coronary arteries: Normal origins.

Coronary Calcium Score:

Left main:

Left anterior descending artery: 58.4, proximal

Left circumflex artery: 0

Right coronary artery: 0

Total: 87

Percentile: 87th

Pericardium: Normal.

Ascending Aorta: Normal caliber. Aortic root calcifications were
noted.

Non-cardiac: See separate report from [REDACTED]. Dilated
main pulmonary artery to at least 30 mm (non-contrast), suggestive
of pulmonary hypertension.
IMPRESSION: 1. Coronary calcium score of 87. This was 87th percentile for age-,
race-, and sex-matched controls.
2. Dilated main pulmonary artery to at least 30 mm (non-contrast),
suggestive of pulmonary hypertension.
3. Aortic root calcifications.



If CAC=0, it is reasonable to withhold statin therapy and reassess
in 5 to 10 years, as long as higher risk conditions are absent
(diabetes mellitus, family history of premature CHD in first degree
relatives (males <55 years; females <65 years), cigarette smoking,
or LDL >=190 mg/dL).

If CAC is 1 to 99, it is reasonable to initiate statin therapy for
patients >=55 years of age.

If CAC is >=100 or >=75th percentile, it is reasonable to initiate
statin therapy at any age.

Cardiology referral should be considered for patients with CAC
scores >=400 or >=75th percentile.

*6551 AHA/ACC/AACVPR/AAPA/ABC/GODREAU/LIND/OLOGO/Jan Douwe/ARREAGA/MELEGA/TORAL
Guideline on the Management of Blood Cholesterol: A Report of the
American College of Cardiology/American Heart Association Task Force
on Clinical Practice Guidelines. J Am Coll Cardiol.
9974;73(24):5203-5715.

*** End of Addendum ***
EXAM:
OVER-READ INTERPRETATION  CT CHEST

The following report is an over-read performed by radiologist Dr.
Noorita Malka [REDACTED] on 11/20/2020. This
over-read does not include interpretation of cardiac or coronary
anatomy or pathology. The coronary calcium score interpretation by
the cardiologist is attached.
FINDINGS: Atherosclerotic calcifications in the thoracic aorta. Within the
visualized portions of the thorax there are no suspicious appearing
pulmonary nodules or masses, there is no acute consolidative
airspace disease, no pleural effusions, no pneumothorax and no
lymphadenopathy. Visualized portions of the upper abdomen are
unremarkable. There are no aggressive appearing lytic or blastic
lesions noted in the visualized portions of the skeleton.
IMPRESSION: 1.  Aortic Atherosclerosis (KD535-3RM.M).

## 2022-03-09 ENCOUNTER — Ambulatory Visit
Admission: EM | Admit: 2022-03-09 | Discharge: 2022-03-09 | Disposition: A | Discharge status: Home / Self Care | Payer: BC Managed Care – PPO | Attending: Emergency Medicine | Admitting: Emergency Medicine

## 2022-03-09 DIAGNOSIS — H1033 Unspecified acute conjunctivitis, bilateral: Secondary | ICD-10-CM

## 2022-03-09 MED ORDER — TOBRAMYCIN-DEXAMETHASONE 0.3-0.1 % OP SUSP: 2.0000 [drp] | Freq: Four times a day (QID) | OPHTHALMIC | 0 refills | Status: DC

## 2022-03-09 NOTE — ED Triage Notes (Addendum)
Pt was spraying Rain & Stain Repellent on her shoes in the sink and believes she got some in Both Eyes.   Pt states that she was spraying at 11:45 and immediately after she went and got in a salt water pool.   Pt states that she felt her eyes sting so she began to rinse out her eyes in a sink.   Pt states that her eyes feel better after being rinsed.   Pt last saw her Eye doctor in July.

## 2022-03-09 NOTE — Discharge Instructions (Signed)
Instill 2 drops of TobraDex in both eyes 4 times a day for 5 days.  Abstain from wearing contacts until your symptoms have completely resolved.  When you do resume wearing contacts start with a fresh pair.  Make sure that you are storing your contracts in a clean with clean saline nightly.  Wash your contacts with an enzymatic cleaner once weekly and rinse them thoroughly with new, clean contact lens solution to remove any enzymatic residue.  If you have any worsening of your symptoms such as increased pain, increased redness, increased photosensitivity, or changes in your vision you need to follow-up with your eye doctor.

## 2022-03-09 NOTE — ED Provider Notes (Signed)
[facility]    CSN: [id number] Arrival date & time: 03/09/22  1318      History   Chief Complaint Chief Complaint  Patient presents with   Eye Problem    HPI Holly Villegas is a 61 y.o. female.   HPI  65 old female here for evaluation of eye complaint.  Patient reports that she was spraying her shoes with a protectant spray called [place] rain and stain repellent and she thinks she may have gotten some in her eyes.  She states that her shoes were in the sink when she was arms distance away when she was spraying them with a pump bottle, not an aerosol bottle.  She endorses that she may have inhaled some of the fumes as she did have a cough but no eye irritation.  Eye irritation began a little later on when she was driving to her water aerobics class.  It is in a salt water pool she states that her eyes became more irritated and she rubbed her eyes after her hands in the water and this increase the irritation and itching.  She got out of the pool, rinsed her eyes in the sink, and then rinse her eyes in the shower.  She states that she does have some blurry vision and that it was difficult to drive home.  She retreated the bottle and then drove here.  Her vision has improved.  Past Medical History:  Diagnosis Date   Cancer ([place])    left sided breast ca   Diverticulosis    GERD (gastroesophageal reflux disease)    Hypercholesteremia    Hypertension     Patient Active Problem List   Diagnosis Date Noted   PVC's (premature ventricular contractions) 10/27/2021   Aortic atherosclerosis ([place]) 01/19/2021   Migraine with aura and without status migrainosus, not intractable 07/09/2019   Hx of breast cancer 12/26/2018   Lymphedema 02/03/2017   Lumbar degenerative disc disease 06/16/2016   Bilateral sciatica 06/11/2016   History of left breast cancer 03/26/2016   Left wrist tendonitis 06/09/2015   Plantar fasciitis, bilateral 11/29/2014   Vitamin D deficiency  11/29/2014   Dupuytren's contracture of foot 11/29/2014   Dyslipidemia 11/28/2014   Environmental and seasonal allergies 11/28/2014   Raynaud's syndrome without gangrene 11/28/2014   Carpal tunnel syndrome 11/28/2014   Benign colonic polyp 11/28/2014   Diverticulosis of colon without diverticulitis 11/28/2014   Essential hypertension 11/28/2014   Varicose veins of both lower extremities with pain 11/28/2014   Raynaud's phenomenon 11/28/2014    Past Surgical History:  Procedure Laterality Date   BREAST BIOPSY Left 02/24/2016   path pending   CHOLECYSTECTOMY  2012   COLONOSCOPY WITH PROPOFOL N/A 10/13/2015   Procedure: COLONOSCOPY WITH PROPOFOL;  Surgeon: Hulen Luster, MD;  Location: [facility] ENDOSCOPY;  Service: Gastroenterology;  Laterality: N/A;   ESOPHAGEAL DILATION  07/2009   THROAT SURGERY  2011   polyp removed from vocal cord   TUBAL LIGATION  1991    OB History   No obstetric history on file.      Home Medications    Prior to Admission medications   Medication Sig Start Date End Date Taking? Authorizing Provider  acetaminophen (TYLENOL) 500 MG tablet Take 1 tablet by mouth once as needed.   Yes [provider]  Ascorbic Acid (VITAMIN C PO) Take by mouth daily.   Yes [provider]  aspirin 81 MG chewable tablet Chew 81 mg by mouth daily. 08/29/21  Yes [provider]  atorvastatin (LIPITOR) 20 MG tablet Take 1 tablet (20 mg total) by mouth daily. 10/27/21  Yes Glean Hess, MD  azelastine (ASTELIN) 0.1 % nasal spray PLACE 2 SPRAYS INTO BOTH NOSTRILS TWICE DAILY 10/16/21  Yes Glean Hess, MD  Calcium-Magnesium-Vitamin D (CALCIUM MAGNESIUM PO) Take by mouth.   Yes [provider]  cetirizine (ZYRTEC) 10 MG tablet TAKE (1) TABLET BY MOUTH EVERY DAY 06/17/15  Yes Glean Hess, MD  fluticasone [facility]) 50 MCG/ACT nasal spray PLACE 2 SPRAYS INTO BOTH NOSTRILS EVERY DAY 10/16/21  Yes Glean Hess, MD  lisinopril (ZESTRIL) 20 MG  tablet Take 1 tablet (20 mg total) by mouth daily. 10/27/21  Yes Glean Hess, MD  Melatonin 10 MG TABS Take by mouth at bedtime as needed.   Yes [provider]  Multiple Vitamins-Minerals (ZINC PO) zinc 50 mg capsule   1 capsule every day by oral route.   Yes [provider]  pantoprazole (PROTONIX) 20 MG tablet Take by mouth. 07/31/21  Yes [provider]  Probiotic Product (PROBIOTIC ADVANCED PO) Take by mouth.   Yes [provider]  tobramycin-dexamethasone [facility]) ophthalmic solution Place 2 drops into the right eye every 6 (six) hours. 03/09/22  Yes Margarette Canada, NP  UNABLE TO FIND magnesium 250 mg tablet   1 tablet every day by oral route.   Yes [provider]  Vitamin A 2400 MCG (8000 UT) CAPS daily.   Yes [provider]  vitamin C (ASCORBIC ACID) 500 MG tablet Take 500 mg by mouth 2 (two) times daily.   Yes [provider]  VITAMIN D PO Take by mouth daily.   Yes [provider]  Zinc Citrate-Phytase (ZYTAZE) 25-500 MG CAPS daily.   Yes [provider]    Family History Family History  Problem Relation Age of Onset   Hypertension Mother    Migraines Mother    Peptic Ulcer Disease Father    Breast cancer Maternal Aunt 40    Social History Social History   Tobacco Use   Smoking status: Never   Smokeless tobacco: Never  Vaping Use   Vaping Use: Never used  Substance Use Topics   Alcohol use: Yes    Alcohol/week: 0.0 standard drinks of alcohol    Comment: social   Drug use: No     Allergies   Gluten meal, Shellfish allergy, Codeine sulfate, Shellfish-derived products, Sulfa antibiotics, and Brassica oleracea   Review of Systems Review of Systems  Eyes:  Positive for redness, itching and visual disturbance.     Physical Exam Triage Vital Signs ED Triage Vitals  Enc Vitals Group     BP 03/09/22 1329 (!) 158/55     Pulse Rate 03/09/22 1329 60     Resp 03/09/22 1329 18      Temp 03/09/22 1329 98.1 F (36.7 C)     Temp Source 03/09/22 1329 Oral     SpO2 03/09/22 1329 100 %     Weight 03/09/22 1327 180 lb (81.6 kg)     Height 03/09/22 1327 '5\' 4"'$  (1.626 m)     Head Circumference --      Peak Flow --      Pain Score 03/09/22 1327 7     Pain Loc --      Pain Edu? --      Excl. in [place]? --    No data found.  Updated Vital Signs BP 110/78 (BP  Location: Left Arm)   Pulse 60   Temp 98.1 F (36.7 C) (Oral)   Resp 18   Ht '5\' 4"'$  (1.626 m)   Wt 180 lb (81.6 kg)   SpO2 100%   BMI 30.90 kg/m   Visual Acuity Right Eye Distance: 20/20 (Pt states the vision is blurry) Left Eye Distance: 20/20 (Pt states the vision is more blurry than Right Eye) Bilateral Distance: 20/20  Right Eye Near:   Left Eye Near:    Bilateral Near:   (Pt wears glasses)  Physical Exam Vitals and nursing note reviewed.  Constitutional:      Appearance: Normal appearance. She is not ill-appearing.  HENT:     Head: Normocephalic and atraumatic.  Eyes:     General: No scleral icterus.       Right eye: No discharge.        Left eye: No discharge.     Extraocular Movements: Extraocular movements intact.     Pupils: Pupils are equal, round, and reactive to light.  Skin:    General: Skin is warm and dry.     Capillary Refill: Capillary refill takes less than 2 seconds.     Findings: No erythema or rash.  Neurological:     General: No focal deficit present.     Mental Status: She is alert and oriented to person, place, and time.  Psychiatric:        Mood and Affect: Mood normal.        Behavior: Behavior normal.        Thought Content: Thought content normal.        Judgment: Judgment normal.      UC Treatments / Results  Labs (all labs ordered are listed, but only abnormal results are displayed) Labs Reviewed - No data to display  EKG   Radiology No results found.  Procedures Procedures (including critical care time)  Medications Ordered in UC Medications - No  data to display  Initial Impression / Assessment and Plan / UC Course  I have reviewed the triage vital signs and the nursing notes.  Pertinent labs & imaging results that were available during my care of the patient were reviewed by me and considered in my medical decision making (see chart for details).   Patient is a pleasant, nontoxic-appearing 61 year old female here for evaluation of bilateral eye irritation that has been going on for the past couple of hours.  She believes she may have gotten some she protectant spray in her eyes but she is unsure.  Her symptoms began after she was using a spray and intensified after getting in a salt water pool.  She states she did not submerge her head but her eyes were itching so she was rubbing them after her hands were in the pool.  This intensified the itching and burn and she is out of the pool, flushed her eyes at the sink, and then flushed her eyes in the shower.  She states she did not flush for a total of 15 minutes but she is unsure how long she flushed her eyes.  On exam patient has mild erythema of her bulbar and labral conjunctiva bilaterally.  Her pupils are equal round reactive and her EOM is intact.  She has a number light reflex in both eyes.  There is no discharge from either eye.  Her visual acuity reflects OU 20/20, OD 2020, and OS 20/20 though she reports with singular vision of her left and right eye  that it is blurry.  She does not, that is blurry with her eyes both focusing at the same time.  I checked the patient's eye pH with pH paper and her eye pH is 7.  It is unclear if the patient's irritation is coming from her allergies, which she has and uses Pat[place] daily, some contact with the spray, or the salt water pool.  Or a combination of factors.  Her exam does suggest possible chemical conjunctivitis.  I will treat her with TobraDex eyedrops and have her avoid any contact with the shoe protectant spray.  If her symptoms do not improve, or  they worsen, I have recommended that she follow-up with her ophthalmologist.   Final Clinical Impressions(s) / UC Diagnoses   Final diagnoses:  Acute conjunctivitis of both eyes, unspecified acute conjunctivitis type     Discharge Instructions      Instill 2 drops of TobraDex in both eyes 4 times a day for 5 days.  Abstain from wearing contacts until your symptoms have completely resolved.  When you do resume wearing contacts start with a fresh pair.  Make sure that you are storing your contracts in a clean with clean saline nightly.  Wash your contacts with an enzymatic cleaner once weekly and rinse them thoroughly with new, clean contact lens solution to remove any enzymatic residue.  If you have any worsening of your symptoms such as increased pain, increased redness, increased photosensitivity, or changes in your vision you need to follow-up with your eye doctor.      ED Prescriptions     Medication Sig Dispense Auth. Provider   tobramycin-dexamethasone [facility]) ophthalmic solution Place 2 drops into the right eye every 6 (six) hours. 5 mL Margarette Canada, NP      PDMP not reviewed this encounter.   Margarette Canada, NP 03/09/22 [id number]

## 2022-04-14 IMAGING — CR DG HAND COMPLETE 3+V*L*
3 series · 3 of 3 positions shown · non-contrast
Comparison: None.

CLINICAL DATA: Pain and swelling of the left hand. Fell 02/11/2021.

EXAM:
LEFT HAND - COMPLETE 3+ VIEW

[hand ap]
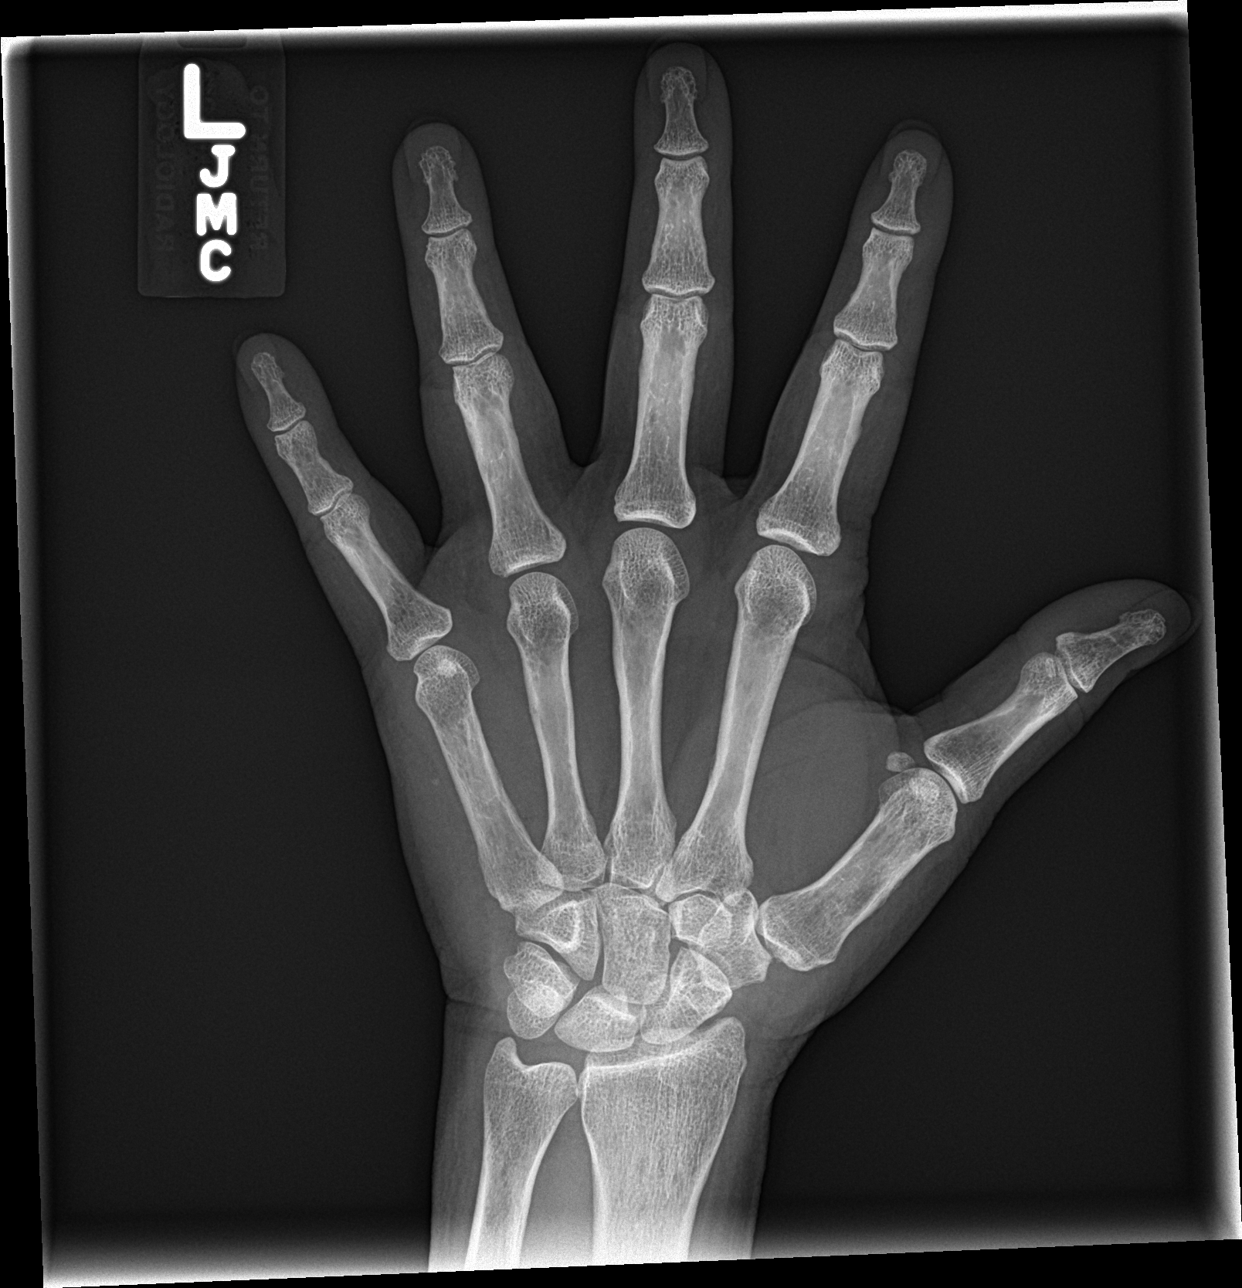

[hand obl]
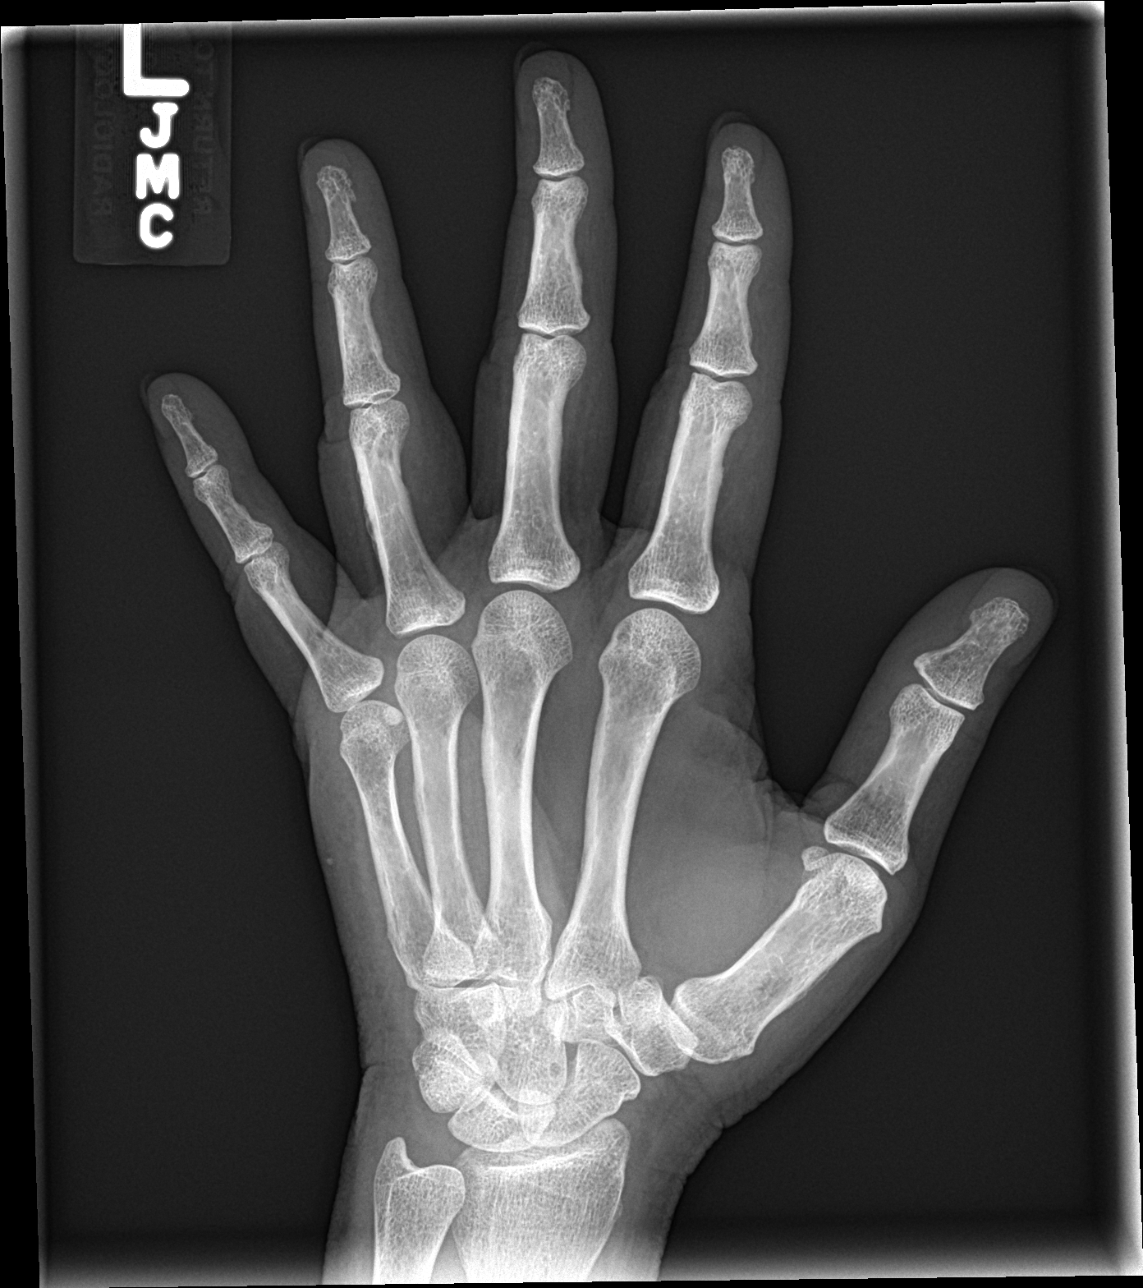

[hand lat]
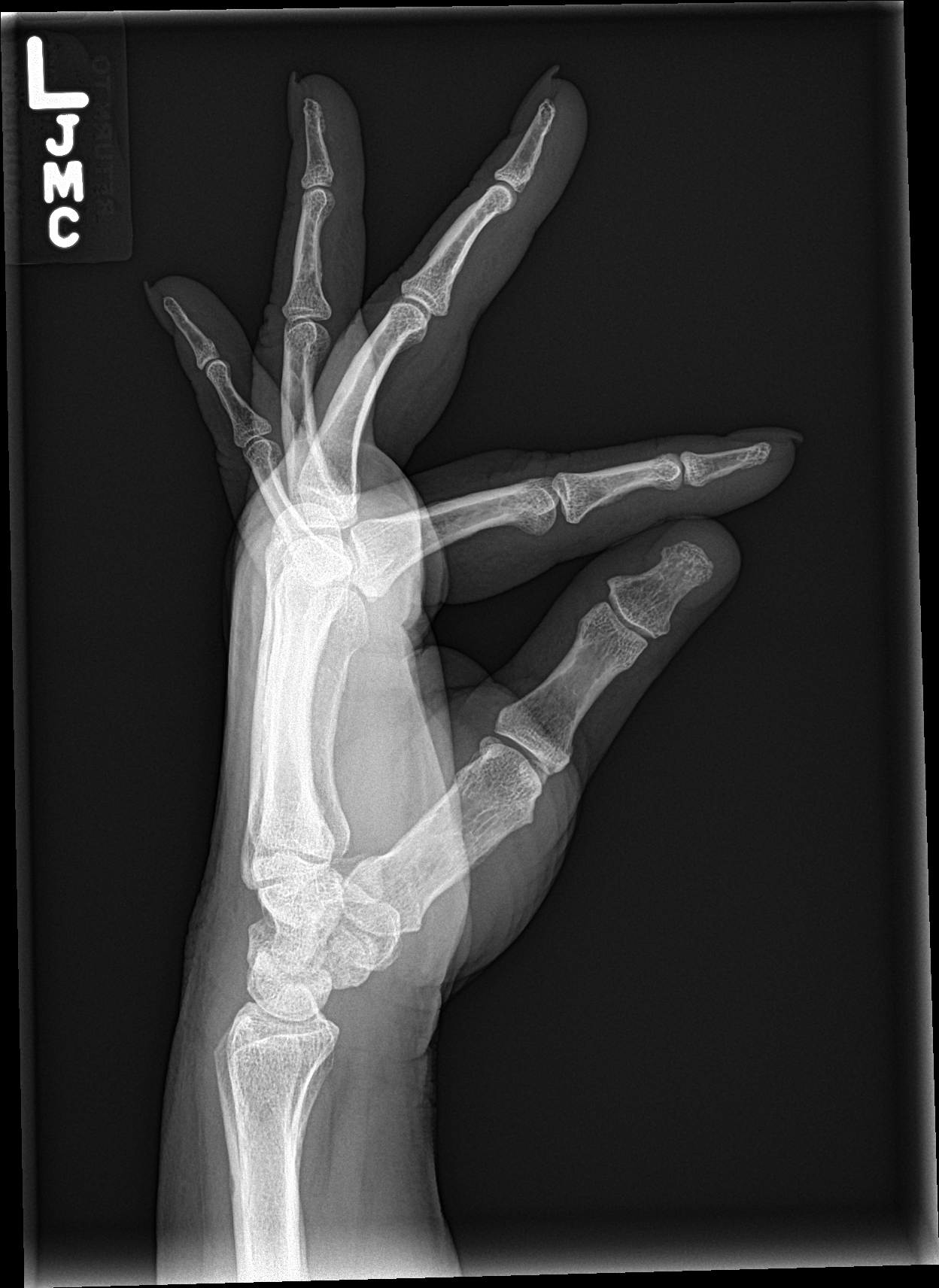

[3 of 3 positions shown; findings below may reference images not displayed]

FINDINGS: There is no evidence of fracture or dislocation. There is no
evidence of arthropathy or other focal bone abnormality. Soft
tissues are unremarkable.
IMPRESSION: Negative.

## 2022-04-29 ENCOUNTER — Ambulatory Visit (INDEPENDENT_AMBULATORY_CARE_PROVIDER_SITE_OTHER): Payer: BC Managed Care – PPO | Admitting: Internal Medicine

## 2022-04-29 ENCOUNTER — Encounter: Payer: Self-pay | Admitting: Internal Medicine

## 2022-04-29 VITALS — BP 136/80 | HR 73 | Ht 64.0 in | Wt 189.0 lb

## 2022-04-29 DIAGNOSIS — E785 Hyperlipidemia, unspecified: Secondary | ICD-10-CM

## 2022-04-29 DIAGNOSIS — I10 Essential (primary) hypertension: Secondary | ICD-10-CM | POA: Diagnosis not present

## 2022-04-29 DIAGNOSIS — Z853 Personal history of malignant neoplasm of breast: Secondary | ICD-10-CM

## 2022-04-29 DIAGNOSIS — Z131 Encounter for screening for diabetes mellitus: Secondary | ICD-10-CM | POA: Diagnosis not present

## 2022-04-29 DIAGNOSIS — J0100 Acute maxillary sinusitis, unspecified: Secondary | ICD-10-CM | POA: Diagnosis not present

## 2022-04-29 DIAGNOSIS — I700 Atherosclerosis of aorta: Secondary | ICD-10-CM

## 2022-04-29 DIAGNOSIS — Z0000 Encounter for general adult medical examination without abnormal findings: Secondary | ICD-10-CM | POA: Diagnosis not present

## 2022-04-29 DIAGNOSIS — K219 Gastro-esophageal reflux disease without esophagitis: Secondary | ICD-10-CM | POA: Insufficient documentation

## 2022-04-29 LAB — POCT URINALYSIS DIPSTICK
Bilirubin, UA: NEGATIVE
Blood, UA: NEGATIVE
Glucose, UA: NEGATIVE
Ketones, UA: NEGATIVE
Leukocytes, UA: NEGATIVE
Nitrite, UA: NEGATIVE
Protein, UA: NEGATIVE
Spec Grav, UA: 1.015 (ref 1.010–1.025)
Urobilinogen, UA: 0.2 E.U./dL
pH, UA: 6 (ref 5.0–8.0)

## 2022-04-29 MED ORDER — AMOXICILLIN-POT CLAVULANATE 875-125 MG PO TABS: 1.0000 | ORAL_TABLET | Freq: Two times a day (BID) | ORAL | 0 refills | Status: AC

## 2022-04-29 MED ORDER — LISINOPRIL 20 MG PO TABS: 20.0000 mg | ORAL_TABLET | Freq: Every day | ORAL | 1 refills | Status: DC

## 2022-04-29 MED ORDER — ATORVASTATIN CALCIUM 20 MG PO TABS: 20.0000 mg | ORAL_TABLET | Freq: Every day | ORAL | 1 refills | Status: DC

## 2022-04-29 NOTE — Progress Notes (Signed)
Date:  04/29/2022   Name:  Holly Villegas   DOB:  September 01, 1960   MRN:  [id number]   Chief Complaint: Annual Exam (Breast exam no pap) Holly Villegas is a 61 y.o. female who presents today for her Complete Annual Exam. She feels fairly well. She reports exercising- none. She reports she is sleeping fairly well. Breast complaints - none.  Mammogram: 11/2021 [facility] DEXA: none Pap smear: 06/2021 neg thin prep Colonoscopy: 08/2021 repeat 10 yrs  Health Maintenance Due  Topic Date Due   TETANUS/TDAP  06/21/2020    Immunization History  Administered Date(s) Administered   Hep A / Hep B 01/20/2003   Hep B, Unspecified 01/20/2003   Hepatitis B 01/20/2003   Influenza,inj,quad, With Preservative 04/05/2019   Influenza-Unspecified 03/21/2020, 04/18/2021, 04/14/2022   Moderna Covid-19 Vaccine Bivalent Booster 71yr & up 04/27/2021   Moderna Sars-Covid-2 Vaccination 08/27/2019, 10/04/2019, 04/14/2020, 04/14/2022   Tdap 06/21/2010   Zoster Recombinat (Shingrix) 12/26/2018, 04/26/2019   Zoster, Live 12/06/2013, 04/03/2019    Hypertension This is a chronic problem. The problem is controlled. Pertinent negatives include no chest pain, headaches, palpitations or shortness of breath. Past treatments include ACE inhibitors. There is no history of kidney disease, CAD/MI or CVA.  Hyperlipidemia This is a chronic problem. The problem is controlled. Pertinent negatives include no chest pain or shortness of breath. Current antihyperlipidemic treatment includes statins. The current treatment provides significant improvement of lipids.  Sinus Problem This is a new problem. The current episode started in the past 7 days. The problem is unchanged. There has been no fever. Associated symptoms include congestion, a hoarse voice and sinus pressure. Pertinent negatives include no chills, coughing, headaches or shortness of breath. Past treatments include oral decongestants, nasal decongestants and saline  nose sprays (and neti pot). The treatment provided moderate relief.  Breast cancer - now 5 years recurrence free.  Gets annual mammograms from ONcology.  Elected no AI due to extreme n/v/d SE.  Lab Results  Component Value Date   NA 139 04/27/2021   K 4.9 04/27/2021   CO2 26 04/27/2021   GLUCOSE 111 (H) 04/27/2021   BUN 11 04/27/2021   CREATININE 0.89 04/27/2021   CALCIUM 9.7 04/27/2021   EGFR 73 04/27/2021   GFRNONAA >60 05/17/2020   Lab Results  Component Value Date   CHOL 188 04/27/2021   HDL 60 04/27/2021   LDLCALC 109 (H) 04/27/2021   TRIG 106 04/27/2021   CHOLHDL 3.1 04/27/2021   Lab Results  Component Value Date   TSH 1.460 04/27/2021   No results found for: "HGBA1C" Lab Results  Component Value Date   WBC 5.2 04/27/2021   HGB 13.1 04/27/2021   HCT 39.1 04/27/2021   MCV 88 04/27/2021   PLT 306 04/27/2021   Lab Results  Component Value Date   ALT 30 04/27/2021   AST 33 04/27/2021   ALKPHOS 101 04/27/2021   BILITOT 0.3 04/27/2021   Lab Results  Component Value Date   VD25OH 65.7 12/27/2017     Review of Systems  Constitutional:  Negative for chills, fatigue and fever.  HENT:  Positive for congestion, hoarse voice and sinus pressure. Negative for hearing loss, tinnitus, trouble swallowing and voice change.   Eyes:  Negative for visual disturbance.  Respiratory:  Negative for cough, chest tightness, shortness of breath and wheezing.   Cardiovascular:  Negative for chest pain, palpitations and leg swelling.  Gastrointestinal:  Negative for abdominal pain, constipation, diarrhea and vomiting.  Intermittent gas and belching  Endocrine: Negative for polydipsia and polyuria.  Genitourinary:  Negative for dysuria, frequency, genital sores, vaginal bleeding and vaginal discharge.  Musculoskeletal:  Negative for arthralgias, gait problem and joint swelling.  Skin:  Negative for color change and rash.  Neurological:  Negative for dizziness, tremors,  light-headedness and headaches.  Hematological:  Negative for adenopathy. Does not bruise/bleed easily.  Psychiatric/Behavioral:  Negative for dysphoric mood and sleep disturbance. The patient is not nervous/anxious.     Patient Active Problem List   Diagnosis Date Noted   Gastroesophageal reflux disease without esophagitis 04/29/2022   PVC's (premature ventricular contractions) 10/27/2021   Aortic atherosclerosis ([place]) 01/19/2021   Migraine with aura and without status migrainosus, not intractable 07/09/2019   Lymphedema 02/03/2017   Lumbar degenerative disc disease 06/16/2016   Bilateral sciatica 06/11/2016   History of left breast cancer 03/26/2016   Left wrist tendonitis 06/09/2015   Plantar fasciitis, bilateral 11/29/2014   Vitamin D deficiency 11/29/2014   Dupuytren's contracture of foot 11/29/2014   Dyslipidemia 11/28/2014   Environmental and seasonal allergies 11/28/2014   Raynaud's syndrome without gangrene 11/28/2014   Carpal tunnel syndrome 11/28/2014   Benign colonic polyp 11/28/2014   Diverticulosis of colon without diverticulitis 11/28/2014   Essential hypertension 11/28/2014   Varicose veins of both lower extremities with pain 11/28/2014   Raynaud's phenomenon 11/28/2014    Allergies  Allergen Reactions   Gluten Meal     Other reaction(s): Abdominal Pain   Shellfish Allergy     Other reaction(s): Vomiting   Codeine Sulfate Nausea And Vomiting   Shellfish-Derived Products    Sulfa Antibiotics Nausea And Vomiting   Brassica Oleracea Nausea And Vomiting and Other (See Comments)    Does not digest and vomits up    Past Surgical History:  Procedure Laterality Date   BREAST BIOPSY Left 02/24/2016   path pending   CHOLECYSTECTOMY  2012   COLONOSCOPY WITH PROPOFOL N/A 10/13/2015   Procedure: COLONOSCOPY WITH PROPOFOL;  Surgeon: Hulen Luster, MD;  Location[facility] ENDOSCOPY;  Service: Gastroenterology;  Laterality: N/A;   ESOPHAGEAL DILATION  07/2009   THROAT  SURGERY  2011   polyp removed from vocal cord   TUBAL LIGATION  1991    Social History   Tobacco Use   Smoking status: Never   Smokeless tobacco: Never  Vaping Use   Vaping Use: Never used  Substance Use Topics   Alcohol use: Yes    Alcohol/week: 0.0 standard drinks of alcohol    Comment: social   Drug use: No     Medication list has been reviewed and updated.  Current Meds  Medication Sig   acetaminophen (TYLENOL) 500 MG tablet Take 1 tablet by mouth once as needed.   Ascorbic Acid (VITAMIN C PO) Take by mouth daily.   atorvastatin (LIPITOR) 20 MG tablet Take 1 tablet (20 mg total) by mouth daily.   azelastine (ASTELIN) 0.1 % nasal spray PLACE 2 SPRAYS INTO BOTH NOSTRILS TWICE DAILY   Calcium-Magnesium-Vitamin D (CALCIUM MAGNESIUM PO) Take by mouth.   cetirizine (ZYRTEC) 10 MG tablet TAKE (1) TABLET BY MOUTH EVERY DAY   fluticasone (FLONASE) 50 MCG/ACT nasal spray PLACE 2 SPRAYS INTO BOTH NOSTRILS EVERY DAY   lisinopril (ZESTRIL) 20 MG tablet Take 1 tablet (20 mg total) by mouth daily.   Melatonin 10 MG TABS Take by mouth at bedtime as needed.   norethindrone (AYGESTIN) 5 MG tablet    pantoprazole (PROTONIX) 20 MG tablet Take  by mouth.   Probiotic Product (PROBIOTIC ADVANCED PO) Take by mouth.   UNABLE TO FIND magnesium 250 mg tablet   1 tablet every day by oral route.   Vitamin A 2400 MCG (8000 UT) CAPS daily.   vitamin C (ASCORBIC ACID) 500 MG tablet Take 500 mg by mouth 2 (two) times daily.   VITAMIN D PO Take by mouth daily.   Zinc Citrate-Phytase (ZYTAZE) 25-500 MG CAPS daily.       04/29/2022    8:24 AM 10/27/2021    9:02 AM 10/16/2021    1:31 PM 04/27/2021    9:41 AM  GAD 7 : Generalized Anxiety Score  Nervous, Anxious, on Edge 0 0 0 0  Control/stop worrying 0 0 0 0  Worry too much - different things 0 0 0 0  Trouble relaxing 0 0 0 0  Restless 0 0 0 0  Easily annoyed or irritable 0 0 0 0  Afraid - awful might happen 0 0 0 0  Total GAD 7 Score 0 0 0 0   Anxiety Difficulty Not difficult at all Not difficult at all  Not difficult at all       04/29/2022    8:24 AM 10/27/2021    9:02 AM 10/16/2021    1:31 PM  Depression screen PHQ 2/9  Decreased Interest 0 0 0  Down, Depressed, Hopeless 0 0 0  PHQ - 2 Score 0 0 0  Altered sleeping 0 0 0  Tired, decreased energy 1 0 0  Change in appetite 1 1 0  Feeling bad or failure about yourself  0 0 0  Trouble concentrating 0 0 0  Moving slowly or fidgety/restless 0 0 0  Suicidal thoughts 0 0 0  PHQ-9 Score 2 1 0  Difficult doing work/chores Not difficult at all Not difficult at all Not difficult at all    BP Readings from Last 3 Encounters:  04/29/22 136/80  03/09/22 110/78  12/09/21 136/82    Physical Exam Vitals and nursing note reviewed.  Constitutional:      General: She is not in acute distress.    Appearance: She is well-developed.  HENT:     Head: Normocephalic and atraumatic.     Right Ear: Tympanic membrane, ear canal and external ear normal. Tympanic membrane is not erythematous or retracted.     Left Ear: Tympanic membrane, ear canal and external ear normal. Tympanic membrane is not erythematous or retracted.     Nose:     Right Sinus: Maxillary sinus tenderness and frontal sinus tenderness present.     Left Sinus: Maxillary sinus tenderness and frontal sinus tenderness present.     Mouth/Throat:     Mouth: No oral lesions.     Pharynx: Uvula midline. Posterior oropharyngeal erythema present. No oropharyngeal exudate.  Eyes:     General: No scleral icterus.       Right eye: No discharge.        Left eye: No discharge.     Conjunctiva/sclera: Conjunctivae normal.  Neck:     Thyroid: No thyromegaly.     Vascular: No carotid bruit.  Cardiovascular:     Rate and Rhythm: Normal rate and regular rhythm.     Pulses: Normal pulses.     Heart sounds: Normal heart sounds.  Pulmonary:     Effort: Pulmonary effort is normal. No respiratory distress.     Breath sounds: Normal  breath sounds. No wheezing or rales.  Abdominal:  General: Bowel sounds are normal.     Palpations: Abdomen is soft.     Tenderness: There is no abdominal tenderness.  Musculoskeletal:     Cervical back: Normal range of motion. No erythema.     Right lower leg: No edema.     Left lower leg: No edema.  Lymphadenopathy:     Cervical: No cervical adenopathy.  Skin:    General: Skin is warm and dry.     Findings: No rash.  Neurological:     Mental Status: She is alert and oriented to person, place, and time.     Cranial Nerves: No cranial nerve deficit.     Sensory: No sensory deficit.     Deep Tendon Reflexes: Reflexes are normal and symmetric.  Psychiatric:        Attention and Perception: Attention normal.        Mood and Affect: Mood normal.     Wt Readings from Last 3 Encounters:  04/29/22 189 lb (85.7 kg)  03/09/22 180 lb (81.6 kg)  12/09/21 183 lb (83 kg)    BP 136/80 (BP Location: Left Arm, Cuff Size: Normal)   Pulse 73   Ht _0  (1.626 m)   Wt 189 lb (85.7 kg)   SpO2 98%   BMI 32.44 kg/m   Assessment and Plan: 1. Annual physical exam Normal exam. Continue healthy diet and exercise - CBC with Differential/Platelet - Comprehensive metabolic panel - Hemoglobin A1c - Lipid panel - TSH  2. Essential hypertension Clinically stable exam with well controlled BP. Tolerating medications without side effects at this time. Pt to continue current regimen and low sodium diet; benefits of regular exercise as able discussed. - CBC with Differential/Platelet - TSH - POCT urinalysis dipstick - lisinopril (ZESTRIL) 20 MG tablet; Take 1 tablet (20 mg total) by mouth daily.  Dispense: 90 tablet; Refill: 1  3. Dyslipidemia On statin therapy. - Lipid panel - atorvastatin (LIPITOR) 20 MG tablet; Take 1 tablet (20 mg total) by mouth daily.  Dispense: 90 tablet; Refill: 1  4. History of left breast cancer Followed by Oncology Recent mammogram normal.  5. Screening  for diabetes mellitus - Hemoglobin A1c  6. Gastroesophageal reflux disease without esophagitis Has some bloating and gas but no other worrisome symptoms  - CBC with Differential/Platelet  7. Aortic atherosclerosis ([place]) On statin  8. Acute non-recurrent maxillary sinusitis Continue mucinex, Flonase, Astelin and Neti Pot - amoxicillin-clavulanate (AUGMENTIN) 875-125 MG tablet; Take 1 tablet by mouth 2 (two) times daily for 10 days.  Dispense: 20 tablet; Refill: 0   Partially dictated using Editor, commissioning. Any errors are unintentional.  Halina Maidens, MD [facility]  04/29/2022

## 2022-04-30 LAB — COMPREHENSIVE METABOLIC PANEL
ALT: 18 IU/L (ref 0–32)
AST: 17 IU/L (ref 0–40)
Albumin/Globulin Ratio: 1.8 (ref 1.2–2.2)
Albumin: 4.2 g/dL (ref 3.9–4.9)
Alkaline Phosphatase: 120 IU/L (ref 44–121)
BUN/Creatinine Ratio: 13 (ref 12–28)
BUN: 10 mg/dL (ref 8–27)
Bilirubin Total: <0.2 mg/dL (ref 0.0–1.2)
CO2: 24 mmol/L (ref 20–29)
Calcium: 8.8 mg/dL (ref 8.7–10.3)
Chloride: 100 mmol/L (ref 96–106)
Creatinine, Ser: 0.8 mg/dL (ref 0.57–1.00)
Globulin, Total: 2.3 g/dL (ref 1.5–4.5)
Glucose: 100 mg/dL — ABNORMAL HIGH (ref 70–99)
Potassium: 4.4 mmol/L (ref 3.5–5.2)
Sodium: 138 mmol/L (ref 134–144)
Total Protein: 6.5 g/dL (ref 6.0–8.5)
eGFR: 83 mL/min/{1.73_m2} (ref >59)

## 2022-04-30 LAB — CBC WITH DIFFERENTIAL/PLATELET
Basophils Absolute: 0.1 10*3/uL (ref 0.0–0.2)
Basos: 1 %
EOS (ABSOLUTE): 0.2 10*3/uL (ref 0.0–0.4)
Eos: 3 %
Hematocrit: 36.6 % (ref 34.0–46.6)
Hemoglobin: 12.1 g/dL (ref 11.1–15.9)
Immature Grans (Abs): 0 10*3/uL (ref 0.0–0.1)
Immature Granulocytes: 0 %
Lymphocytes Absolute: 1.7 10*3/uL (ref 0.7–3.1)
Lymphs: 29 %
MCH: 29.2 pg (ref 26.6–33.0)
MCHC: 33.1 g/dL (ref 31.5–35.7)
MCV: 88 fL (ref 79–97)
Monocytes Absolute: 0.4 10*3/uL (ref 0.1–0.9)
Monocytes: 8 %
Neutrophils Absolute: 3.4 10*3/uL (ref 1.4–7.0)
Neutrophils: 59 %
Platelets: 244 10*3/uL (ref 150–450)
RBC: 4.14 x10E6/uL (ref 3.77–5.28)
RDW: 12 % (ref 11.7–15.4)
WBC: 5.7 10*3/uL (ref 3.4–10.8)

## 2022-04-30 LAB — LIPID PANEL
Chol/HDL Ratio: 2.5 ratio (ref 0.0–4.4)
Cholesterol, Total: 175 mg/dL (ref 100–199)
HDL: 69 mg/dL (ref >39)
LDL Chol Calc (NIH): 89 mg/dL (ref 0–99)
Triglycerides: 97 mg/dL (ref 0–149)
VLDL Cholesterol Cal: 17 mg/dL (ref 5–40)

## 2022-04-30 LAB — HEMOGLOBIN A1C
Est. average glucose Bld gHb Est-mCnc: 120 mg/dL
Hgb A1c MFr Bld: 5.8 % — ABNORMAL HIGH (ref 4.8–5.6)

## 2022-04-30 LAB — TSH: TSH: 2.95 u[IU]/mL (ref 0.450–4.500)

## 2022-10-04 LAB — HM MAMMOGRAPHY

## 2022-10-15 ENCOUNTER — Encounter: Payer: Self-pay | Admitting: Internal Medicine

## 2022-10-15 ENCOUNTER — Ambulatory Visit: Payer: BC Managed Care – PPO | Admitting: Internal Medicine

## 2022-10-15 VITALS — BP 122/76 | HR 31 | Ht 64.0 in | Wt 163.0 lb

## 2022-10-15 DIAGNOSIS — N3001 Acute cystitis with hematuria: Secondary | ICD-10-CM | POA: Diagnosis not present

## 2022-10-15 DIAGNOSIS — R310 Gross hematuria: Secondary | ICD-10-CM

## 2022-10-15 LAB — POCT URINALYSIS DIPSTICK
Bilirubin, UA: NEGATIVE
Glucose, UA: NEGATIVE
Ketones, UA: NEGATIVE
Nitrite, UA: NEGATIVE
Protein, UA: NEGATIVE
Spec Grav, UA: 1.01 (ref 1.010–1.025)
Urobilinogen, UA: 0.2 E.U./dL
pH, UA: 6.5 (ref 5.0–8.0)

## 2022-10-15 MED ORDER — CEFUROXIME AXETIL 250 MG PO TABS: 250.0000 mg | ORAL_TABLET | Freq: Two times a day (BID) | ORAL | 0 refills | Status: DC

## 2022-10-15 NOTE — Patient Instructions (Signed)
-  It was a pleasure to see you today! Please review your visit summary for helpful information. -Lab results are usually available within 1-2 days and we will call once reviewed. -I would encourage you to follow your care via MyChart where you can access lab results, notes, messages, and more. -If you feel that we did a nice job today, please complete your after-visit survey and leave us a Google review! Your CMA today was Abayomi Pattison and your provider was Dr Laura Berglund, MD.  

## 2022-10-15 NOTE — Progress Notes (Signed)
Date:  10/15/2022   Name:  Tommi Crepeau [place]   DOB:  1961-04-23   MRN:  [id number]   Chief Complaint: Urinary Tract Infection (Taking old Rx of Cefuroxime and taking AZO. Having blood tinged urine, and frequency. Having discomfort around the bladder area. ) and Abdominal Pain (LLQ and middle of abdomen - difficulty having BM. Patient took some miralax. Still having discomfort. )  Urinary Tract Infection  This is a new problem. Episode onset: 6 days ago. The problem has been waxing and waning. There has been no fever. Associated symptoms include frequency, hematuria and urgency. Pertinent negatives include no nausea. Treatments tried: took Ceftin for 4 days but still has symptoms.  Abdominal Pain This is a new problem. Episode onset: past 4 days. The problem has been unchanged. The pain is located in the periumbilical region. The pain is mild. Associated symptoms include dysuria, frequency and hematuria. Pertinent negatives include no diarrhea, fever or nausea.    Lab Results  Component Value Date   NA 138 04/29/2022   K 4.4 04/29/2022   CO2 24 04/29/2022   GLUCOSE 100 (H) 04/29/2022   BUN 10 04/29/2022   CREATININE 0.80 04/29/2022   CALCIUM 8.8 04/29/2022   EGFR 83 04/29/2022   GFRNONAA >60 05/17/2020   Lab Results  Component Value Date   CHOL 175 04/29/2022   HDL 69 04/29/2022   LDLCALC 89 04/29/2022   TRIG 97 04/29/2022   CHOLHDL 2.5 04/29/2022   Lab Results  Component Value Date   TSH 2.950 04/29/2022   Lab Results  Component Value Date   HGBA1C 5.8 (H) 04/29/2022   Lab Results  Component Value Date   WBC 5.7 04/29/2022   HGB 12.1 04/29/2022   HCT 36.6 04/29/2022   MCV 88 04/29/2022   PLT 244 04/29/2022   Lab Results  Component Value Date   ALT 18 04/29/2022   AST 17 04/29/2022   ALKPHOS 120 04/29/2022   BILITOT <0.2 04/29/2022   Lab Results  Component Value Date   VD25OH 65.7 12/27/2017     Review of Systems  Constitutional:  Positive for  unexpected weight change (has lost 25 lbs with weight watchers). Negative for diaphoresis, fatigue and fever.  Gastrointestinal:  Positive for abdominal pain. Negative for blood in stool, diarrhea, nausea and rectal pain.  Genitourinary:  Positive for dysuria, frequency, hematuria and urgency.  Psychiatric/Behavioral:  Negative for dysphoric mood and sleep disturbance. The patient is not nervous/anxious.     Patient Active Problem List   Diagnosis Date Noted   Gastroesophageal reflux disease without esophagitis 04/29/2022   PVC's (premature ventricular contractions) 10/27/2021   Aortic atherosclerosis ([facility]) 01/19/2021   Migraine with aura and without status migrainosus, not intractable 07/09/2019   Lymphedema 02/03/2017   Lumbar degenerative disc disease 06/16/2016   Bilateral sciatica 06/11/2016   History of left breast cancer 03/26/2016   Left wrist tendonitis 06/09/2015   Plantar fasciitis, bilateral 11/29/2014   Vitamin D deficiency 11/29/2014   Dupuytren's contracture of foot 11/29/2014   Dyslipidemia 11/28/2014   Environmental and seasonal allergies 11/28/2014   Raynaud's syndrome without gangrene 11/28/2014   Carpal tunnel syndrome 11/28/2014   Benign colonic polyp 11/28/2014   Diverticulosis of colon without diverticulitis 11/28/2014   Essential hypertension 11/28/2014   Varicose veins of both lower extremities with pain 11/28/2014   Raynaud's phenomenon 11/28/2014    Allergies  Allergen Reactions   Gluten Meal     Other reaction(s): Abdominal Pain  Shellfish Allergy     Other reaction(s): Vomiting   Codeine Sulfate Nausea And Vomiting   Shellfish-Derived Products    Sulfa Antibiotics Nausea And Vomiting   Brassica Oleracea Nausea And Vomiting and Other (See Comments)    Does not digest and vomits up    Past Surgical History:  Procedure Laterality Date   BREAST BIOPSY Left 02/24/2016   path pending   CHOLECYSTECTOMY  2012   COLONOSCOPY WITH PROPOFOL N/A  10/13/2015   Procedure: COLONOSCOPY WITH PROPOFOL;  Surgeon: Wallace Cullens, MD;  Location: [facility] ENDOSCOPY;  Service: Gastroenterology;  Laterality: N/A;   ESOPHAGEAL DILATION  07/2009   THROAT SURGERY  2011   polyp removed from vocal cord   TUBAL LIGATION  1991    Social History   Tobacco Use   Smoking status: Never   Smokeless tobacco: Never  Vaping Use   Vaping Use: Never used  Substance Use Topics   Alcohol use: Yes    Alcohol/week: 0.0 standard drinks of alcohol    Comment: social   Drug use: No     Medication list has been reviewed and updated.  Current Meds  Medication Sig   acetaminophen (TYLENOL) 500 MG tablet Take 1 tablet by mouth once as needed.   Ascorbic Acid (VITAMIN C PO) Take by mouth daily.   aspirin 81 MG chewable tablet Chew 81 mg by mouth daily.   atorvastatin (LIPITOR) 20 MG tablet Take 1 tablet (20 mg total) by mouth daily.   azelastine (ASTELIN) 0.1 % nasal spray PLACE 2 SPRAYS INTO BOTH NOSTRILS TWICE DAILY   Calcium-Magnesium-Vitamin D (CALCIUM MAGNESIUM PO) Take by mouth.   cefUROXime (CEFTIN) 250 MG tablet Take 1 tablet (250 mg total) by mouth 2 (two) times daily with a meal for 7 days.   cetirizine (ZYRTEC) 10 MG tablet TAKE (1) TABLET BY MOUTH EVERY DAY   fluticasone (FLONASE) 50 MCG/ACT nasal spray PLACE 2 SPRAYS INTO BOTH NOSTRILS EVERY DAY   lisinopril (ZESTRIL) 20 MG tablet Take 1 tablet (20 mg total) by mouth daily.   Melatonin 10 MG TABS Take by mouth at bedtime as needed.   pantoprazole (PROTONIX) 20 MG tablet Take by mouth.   Probiotic Product (PROBIOTIC ADVANCED PO) Take by mouth.   tobramycin-dexamethasone (TOBRADEX) ophthalmic solution Place 2 drops into the right eye every 6 (six) hours.   UNABLE TO FIND magnesium 250 mg tablet   1 tablet every day by oral route.   Vitamin A 2400 MCG (8000 UT) CAPS daily.   vitamin C (ASCORBIC ACID) 500 MG tablet Take 500 mg by mouth 2 (two) times daily.   VITAMIN D PO Take by mouth daily.   Zinc  Citrate-Phytase (ZYTAZE) 25-500 MG CAPS daily.       04/29/2022    8:24 AM 10/27/2021    9:02 AM 10/16/2021    1:31 PM 04/27/2021    9:41 AM  GAD 7 : Generalized Anxiety Score  Nervous, Anxious, on Edge 0 0 0 0  Control/stop worrying 0 0 0 0  Worry too much - different things 0 0 0 0  Trouble relaxing 0 0 0 0  Restless 0 0 0 0  Easily annoyed or irritable 0 0 0 0  Afraid - awful might happen 0 0 0 0  Total GAD 7 Score 0 0 0 0  Anxiety Difficulty Not difficult at all Not difficult at all  Not difficult at all       04/29/2022    8:24  AM 10/27/2021    9:02 AM 10/16/2021    1:31 PM  Depression screen PHQ 2/9  Decreased Interest 0 0 0  Down, Depressed, Hopeless 0 0 0  PHQ - 2 Score 0 0 0  Altered sleeping 0 0 0  Tired, decreased energy 1 0 0  Change in appetite 1 1 0  Feeling bad or failure about yourself  0 0 0  Trouble concentrating 0 0 0  Moving slowly or fidgety/restless 0 0 0  Suicidal thoughts 0 0 0  PHQ-9 Score 2 1 0  Difficult doing work/chores Not difficult at all Not difficult at all Not difficult at all    BP Readings from Last 3 Encounters:  10/15/22 122/76  04/29/22 136/80  03/09/22 110/78    Physical Exam Vitals and nursing note reviewed.  Constitutional:      General: She is not in acute distress.    Appearance: She is well-developed.  HENT:     Head: Normocephalic and atraumatic.  Cardiovascular:     Rate and Rhythm: Normal rate and regular rhythm.     Heart sounds: No murmur heard. Pulmonary:     Effort: Pulmonary effort is normal. No respiratory distress.     Breath sounds: No wheezing or rhonchi.  Abdominal:     General: Abdomen is flat. Bowel sounds are normal. There is no distension.     Palpations: Abdomen is soft. There is no hepatomegaly.     Tenderness: There is abdominal tenderness in the suprapubic area. There is no right CVA tenderness or left CVA tenderness.  Skin:    General: Skin is warm and dry.     Findings: No rash.   Neurological:     Mental Status: She is alert and oriented to person, place, and time.  Psychiatric:        Mood and Affect: Mood normal.        Behavior: Behavior normal.    Urine dipstick shows positive for RBC's and positive for leukocytes.  Micro exam: not done.  Wt Readings from Last 3 Encounters:  10/15/22 163 lb (73.9 kg)  04/29/22 189 lb (85.7 kg)  03/09/22 180 lb (81.6 kg)    BP 122/76   Pulse (!) 31   Ht 5\' 4"  (1.626 m)   Wt 163 lb (73.9 kg)   SpO2 97%   BMI 27.98 kg/m   Assessment and Plan:  Problem List Items Addressed This Visit   None Visit Diagnoses     Acute cystitis with hematuria    -  Primary   partially treated with improved symptoms but still appears infected will treat with another week of Ceftin Send for culture   Relevant Medications   cefUROXime (CEFTIN) 250 MG tablet   Gross hematuria       Relevant Orders   POCT Urinalysis Dipstick (Completed)   Urine Culture       No follow-ups on file.   Partially dictated using Dragon software, any errors are not intentional.  Reubin Milan, MD [facility] [place], [place]

## 2022-10-17 ENCOUNTER — Encounter: Payer: Self-pay | Admitting: Internal Medicine

## 2022-10-18 ENCOUNTER — Encounter: Payer: Self-pay | Admitting: Internal Medicine

## 2022-10-18 ENCOUNTER — Other Ambulatory Visit: Payer: Self-pay | Admitting: Internal Medicine

## 2022-10-18 DIAGNOSIS — N3001 Acute cystitis with hematuria: Secondary | ICD-10-CM

## 2022-10-18 MED ORDER — NITROFURANTOIN MONOHYD MACRO 100 MG PO CAPS: 100.0000 mg | ORAL_CAPSULE | Freq: Two times a day (BID) | ORAL | 0 refills | Status: AC

## 2022-10-18 NOTE — Telephone Encounter (Signed)
Please review.  KP

## 2022-10-19 LAB — URINE CULTURE

## 2022-10-28 ENCOUNTER — Ambulatory Visit: Payer: BC Managed Care – PPO | Admitting: Internal Medicine

## 2022-10-28 ENCOUNTER — Encounter: Payer: Self-pay | Admitting: Internal Medicine

## 2022-10-28 VITALS — BP 118/70 | HR 53 | Ht 64.0 in | Wt 161.0 lb

## 2022-10-28 DIAGNOSIS — J3089 Other allergic rhinitis: Secondary | ICD-10-CM

## 2022-10-28 DIAGNOSIS — I10 Essential (primary) hypertension: Secondary | ICD-10-CM | POA: Diagnosis not present

## 2022-10-28 DIAGNOSIS — K5730 Diverticulosis of large intestine without perforation or abscess without bleeding: Secondary | ICD-10-CM

## 2022-10-28 DIAGNOSIS — R7303 Prediabetes: Secondary | ICD-10-CM | POA: Diagnosis not present

## 2022-10-28 DIAGNOSIS — E785 Hyperlipidemia, unspecified: Secondary | ICD-10-CM

## 2022-10-28 MED ORDER — ATORVASTATIN CALCIUM 20 MG PO TABS: 20.0000 mg | ORAL_TABLET | Freq: Every day | ORAL | 1 refills | Status: DC

## 2022-10-28 MED ORDER — FLUTICASONE PROPIONATE 50 MCG/ACT NA SUSP: NASAL | 5 refills | Status: DC

## 2022-10-28 MED ORDER — LISINOPRIL 20 MG PO TABS: 20.0000 mg | ORAL_TABLET | Freq: Every day | ORAL | 1 refills | Status: DC

## 2022-10-28 MED ORDER — AZELASTINE HCL 0.1 % NA SOLN: NASAL | 5 refills | Status: AC

## 2022-10-28 NOTE — Assessment & Plan Note (Signed)
Stable exam with well controlled BP.  Currently taking lisinopril. Tolerating medications without concerns or side effects. Will continue to recommend low sodium diet and current regimen.  

## 2022-10-28 NOTE — Assessment & Plan Note (Signed)
Diet controlled. Lab Results  Component Value Date   HGBA1C 5.8 (H) 04/29/2022

## 2022-10-28 NOTE — Assessment & Plan Note (Signed)
Tolerating statin medications.  No side effects noted. LDL is  Lab Results  Component Value Date   LDLCALC 89 04/29/2022   Will advise if dose change is indicated.

## 2022-10-28 NOTE — Progress Notes (Signed)
Date:  10/28/2022   Name:  Holly Villegas   DOB:  April 03, 1961   MRN:  [id number]   Chief Complaint: Hypertension and Diabetes (Pre-diabetes. ) UTI/abd pain - she is feeling better but had a flare of abdominal pain and diarrhea last night.  She plans to call her GI and be seen for follow up. Hypertension This is a chronic problem. Pertinent negatives include no chest pain, headaches, palpitations or shortness of breath.  Hyperlipidemia Pertinent negatives include no chest pain or shortness of breath.  Diabetes She presents for her follow-up diabetic visit. Diabetes type: prediabetes. Her disease course has been stable. Pertinent negatives for hypoglycemia include no dizziness, headaches or nervousness/anxiousness. Pertinent negatives for diabetes include no chest pain, no fatigue and no weakness.    Lab Results  Component Value Date   NA 138 04/29/2022   K 4.4 04/29/2022   CO2 24 04/29/2022   GLUCOSE 100 (H) 04/29/2022   BUN 10 04/29/2022   CREATININE 0.80 04/29/2022   CALCIUM 8.8 04/29/2022   EGFR 83 04/29/2022   GFRNONAA >60 05/17/2020   Lab Results  Component Value Date   CHOL 175 04/29/2022   HDL 69 04/29/2022   LDLCALC 89 04/29/2022   TRIG 97 04/29/2022   CHOLHDL 2.5 04/29/2022   Lab Results  Component Value Date   TSH 2.950 04/29/2022   Lab Results  Component Value Date   HGBA1C 5.8 (H) 04/29/2022   Lab Results  Component Value Date   WBC 5.7 04/29/2022   HGB 12.1 04/29/2022   HCT 36.6 04/29/2022   MCV 88 04/29/2022   PLT 244 04/29/2022   Lab Results  Component Value Date   ALT 18 04/29/2022   AST 17 04/29/2022   ALKPHOS 120 04/29/2022   BILITOT <0.2 04/29/2022   Lab Results  Component Value Date   VD25OH 65.7 12/27/2017     Review of Systems  Constitutional:  Negative for fatigue and unexpected weight change.  HENT:  Negative for nosebleeds.   Eyes:  Negative for visual disturbance.  Respiratory:  Negative for cough, chest tightness,  shortness of breath and wheezing.   Cardiovascular:  Negative for chest pain, palpitations and leg swelling.  Gastrointestinal:  Positive for abdominal pain and diarrhea. Negative for constipation.  Musculoskeletal:  Negative for arthralgias.  Neurological:  Negative for dizziness, weakness, light-headedness and headaches.  Psychiatric/Behavioral:  Negative for dysphoric mood and sleep disturbance. The patient is not nervous/anxious.     Patient Active Problem List   Diagnosis Date Noted   Prediabetes 10/28/2022   Gastroesophageal reflux disease without esophagitis 04/29/2022   PVC's (premature ventricular contractions) 10/27/2021   Aortic atherosclerosis (HCC) 01/19/2021   Migraine with aura and without status migrainosus, not intractable 07/09/2019   Lymphedema 02/03/2017   Lumbar degenerative disc disease 06/16/2016   Bilateral sciatica 06/11/2016   History of left breast cancer 03/26/2016   Left wrist tendonitis 06/09/2015   Plantar fasciitis, bilateral 11/29/2014   Vitamin D deficiency 11/29/2014   Dupuytren's contracture of foot 11/29/2014   Dyslipidemia 11/28/2014   Environmental and seasonal allergies 11/28/2014   Raynaud's syndrome without gangrene 11/28/2014   Carpal tunnel syndrome 11/28/2014   Benign colonic polyp 11/28/2014   Diverticulosis of colon without diverticulitis 11/28/2014   Essential hypertension 11/28/2014   Varicose veins of both lower extremities with pain 11/28/2014   Raynaud's phenomenon 11/28/2014    Allergies  Allergen Reactions   Gluten Meal     Other reaction(s): Abdominal Pain  Shellfish Allergy     Other reaction(s): Vomiting   Codeine Sulfate Nausea And Vomiting   Shellfish-Derived Products    Sulfa Antibiotics Nausea And Vomiting   Brassica Oleracea Nausea And Vomiting and Other (See Comments)    Does not digest and vomits up   Ceftin [Cefuroxime] Diarrhea    Nausea and diarrhea    Past Surgical History:  Procedure Laterality  Date   BREAST BIOPSY Left 02/24/2016   path pending   CHOLECYSTECTOMY  2012   COLONOSCOPY WITH PROPOFOL N/A 10/13/2015   Procedure: COLONOSCOPY WITH PROPOFOL;  Surgeon: Wallace Cullens, MD;  Location: [facility] ENDOSCOPY;  Service: Gastroenterology;  Laterality: N/A;   ESOPHAGEAL DILATION  07/2009   THROAT SURGERY  2011   polyp removed from vocal cord   TUBAL LIGATION  1991    Social History   Tobacco Use   Smoking status: Never   Smokeless tobacco: Never  Vaping Use   Vaping Use: Never used  Substance Use Topics   Alcohol use: Yes    Alcohol/week: 0.0 standard drinks of alcohol    Comment: social   Drug use: No     Medication list has been reviewed and updated.  Current Meds  Medication Sig   acetaminophen (TYLENOL) 500 MG tablet Take 1 tablet by mouth once as needed.   Ascorbic Acid (VITAMIN C PO) Take by mouth daily.   aspirin 81 MG chewable tablet Chew 81 mg by mouth daily.   Calcium-Magnesium-Vitamin D (CALCIUM MAGNESIUM PO) Take by mouth.   cetirizine (ZYRTEC) 10 MG tablet TAKE (1) TABLET BY MOUTH EVERY DAY   Melatonin 10 MG TABS Take by mouth at bedtime as needed.   pantoprazole (PROTONIX) 20 MG tablet Take by mouth.   Probiotic Product (PROBIOTIC ADVANCED PO) Take by mouth.   UNABLE TO FIND magnesium 250 mg tablet   1 tablet every day by oral route.   Vitamin A 2400 MCG (8000 UT) CAPS daily.   vitamin C (ASCORBIC ACID) 500 MG tablet Take 500 mg by mouth 2 (two) times daily.   VITAMIN D PO Take by mouth daily.   Zinc Citrate-Phytase (ZYTAZE) 25-500 MG CAPS daily.   [DISCONTINUED] atorvastatin (LIPITOR) 20 MG tablet Take 1 tablet (20 mg total) by mouth daily.   [DISCONTINUED] azelastine (ASTELIN) 0.1 % nasal spray PLACE 2 SPRAYS INTO BOTH NOSTRILS TWICE DAILY   [DISCONTINUED] fluticasone (FLONASE) 50 MCG/ACT nasal spray PLACE 2 SPRAYS INTO BOTH NOSTRILS EVERY DAY   [DISCONTINUED] lisinopril (ZESTRIL) 20 MG tablet Take 1 tablet (20 mg total) by mouth daily.       10/28/2022     8:30 AM 04/29/2022    8:24 AM 10/27/2021    9:02 AM 10/16/2021    1:31 PM  GAD 7 : Generalized Anxiety Score  Nervous, Anxious, on Edge 0 0 0 0  Control/stop worrying 0 0 0 0  Worry too much - different things 0 0 0 0  Trouble relaxing 0 0 0 0  Restless 0 0 0 0  Easily annoyed or irritable 0 0 0 0  Afraid - awful might happen 0 0 0 0  Total GAD 7 Score 0 0 0 0  Anxiety Difficulty Not difficult at all Not difficult at all Not difficult at all        10/28/2022    8:30 AM 04/29/2022    8:24 AM 10/27/2021    9:02 AM  Depression screen PHQ 2/9  Decreased Interest 0 0 0  Down, Depressed,  Hopeless 0 0 0  PHQ - 2 Score 0 0 0  Altered sleeping 0 0 0  Tired, decreased energy 0 1 0  Change in appetite 0 1 1  Feeling bad or failure about yourself  0 0 0  Trouble concentrating 0 0 0  Moving slowly or fidgety/restless 0 0 0  Suicidal thoughts 0 0 0  PHQ-9 Score 0 2 1  Difficult doing work/chores Not difficult at all Not difficult at all Not difficult at all    BP Readings from Last 3 Encounters:  10/28/22 118/70  10/15/22 122/76  04/29/22 136/80    Physical Exam Vitals and nursing note reviewed.  Constitutional:      General: She is not in acute distress.    Appearance: She is well-developed.  HENT:     Head: Normocephalic and atraumatic.  Neck:     Vascular: No carotid bruit.  Cardiovascular:     Rate and Rhythm: Normal rate and regular rhythm.  Pulmonary:     Effort: Pulmonary effort is normal. No respiratory distress.     Breath sounds: No wheezing or rhonchi.  Musculoskeletal:     Cervical back: Normal range of motion.     Right lower leg: No edema.     Left lower leg: No edema.     Comments: Varicose veins LLE from thigh to ankle - no inflammation.  Lymphadenopathy:     Cervical: No cervical adenopathy.  Skin:    General: Skin is warm and dry.     Findings: No rash.  Neurological:     General: No focal deficit present.     Mental Status: She is alert and  oriented to person, place, and time.  Psychiatric:        Mood and Affect: Mood normal.        Behavior: Behavior normal.     Wt Readings from Last 3 Encounters:  10/28/22 161 lb (73 kg)  10/15/22 163 lb (73.9 kg)  04/29/22 189 lb (85.7 kg)    BP 118/70   Pulse (!) 53   Ht 5\' 4"  (1.626 m)   Wt 161 lb (73 kg)   SpO2 99%   BMI 27.64 kg/m   Assessment and Plan:  Problem List Items Addressed This Visit       Cardiovascular and Mediastinum   Essential hypertension - Primary (Chronic)    Stable exam with well controlled BP.  Currently taking lisinopril. Tolerating medications without concerns or side effects. Will continue to recommend low sodium diet and current regimen.       Relevant Medications   atorvastatin (LIPITOR) 20 MG tablet   lisinopril (ZESTRIL) 20 MG tablet   Other Relevant Orders   CBC with Differential/Platelet     Other   Diverticulosis of colon without diverticulitis    May be having some intermittent symptoms Recommend resuming IBD-Gard as suggested by GI last year. Contact GI for follow up appointment.      Environmental and seasonal allergies   Relevant Medications   fluticasone (FLONASE) 50 MCG/ACT nasal spray   azelastine (ASTELIN) 0.1 % nasal spray   Prediabetes    Diet controlled. Lab Results  Component Value Date   HGBA1C 5.8 (H) 04/29/2022        Relevant Orders   Hemoglobin A1c   Dyslipidemia (Chronic)    Tolerating statin medications.  No side effects noted. LDL is  Lab Results  Component Value Date   LDLCALC 89 04/29/2022  Will advise if dose  change is indicated.       Relevant Medications   atorvastatin (LIPITOR) 20 MG tablet   Other Relevant Orders   Comprehensive metabolic panel   Lipid panel    Return in about 6 months (around 04/30/2023) for CPX.   Partially dictated using Dragon software, any errors are not intentional.  Reubin Milan, MD [facility] [place],  [place]

## 2022-10-28 NOTE — Assessment & Plan Note (Signed)
May be having some intermittent symptoms Recommend resuming IBD-Gard as suggested by GI last year. Contact GI for follow up appointment.

## 2022-10-29 LAB — COMPREHENSIVE METABOLIC PANEL
ALT: 17 IU/L (ref 0–32)
AST: 18 IU/L (ref 0–40)
Albumin/Globulin Ratio: 1.8 (ref 1.2–2.2)
Albumin: 4.1 g/dL (ref 3.9–4.9)
Alkaline Phosphatase: 87 IU/L (ref 44–121)
BUN/Creatinine Ratio: 21 (ref 12–28)
BUN: 16 mg/dL (ref 8–27)
Bilirubin Total: <0.2 mg/dL (ref 0.0–1.2)
CO2: 23 mmol/L (ref 20–29)
Calcium: 9.3 mg/dL (ref 8.7–10.3)
Chloride: 100 mmol/L (ref 96–106)
Creatinine, Ser: 0.78 mg/dL (ref 0.57–1.00)
Globulin, Total: 2.3 g/dL (ref 1.5–4.5)
Glucose: 94 mg/dL (ref 70–99)
Potassium: 5 mmol/L (ref 3.5–5.2)
Sodium: 137 mmol/L (ref 134–144)
Total Protein: 6.4 g/dL (ref 6.0–8.5)
eGFR: 85 mL/min/{1.73_m2} (ref >59)

## 2022-10-29 LAB — CBC WITH DIFFERENTIAL/PLATELET
Basophils Absolute: 0.1 10*3/uL (ref 0.0–0.2)
Basos: 1 %
EOS (ABSOLUTE): 0.1 10*3/uL (ref 0.0–0.4)
Eos: 2 %
Hematocrit: 36.3 % (ref 34.0–46.6)
Hemoglobin: 11.9 g/dL (ref 11.1–15.9)
Immature Grans (Abs): 0 10*3/uL (ref 0.0–0.1)
Immature Granulocytes: 0 %
Lymphocytes Absolute: 1.7 10*3/uL (ref 0.7–3.1)
Lymphs: 36 %
MCH: 28.7 pg (ref 26.6–33.0)
MCHC: 32.8 g/dL (ref 31.5–35.7)
MCV: 88 fL (ref 79–97)
Monocytes Absolute: 0.3 10*3/uL (ref 0.1–0.9)
Monocytes: 7 %
Neutrophils Absolute: 2.4 10*3/uL (ref 1.4–7.0)
Neutrophils: 54 %
Platelets: 282 10*3/uL (ref 150–450)
RBC: 4.14 x10E6/uL (ref 3.77–5.28)
RDW: 12.7 % (ref 11.7–15.4)
WBC: 4.5 10*3/uL (ref 3.4–10.8)

## 2022-10-29 LAB — HEMOGLOBIN A1C
Est. average glucose Bld gHb Est-mCnc: 114 mg/dL
Hgb A1c MFr Bld: 5.6 % (ref 4.8–5.6)

## 2022-10-29 LAB — LIPID PANEL
Chol/HDL Ratio: 2.7 ratio (ref 0.0–4.4)
Cholesterol, Total: 150 mg/dL (ref 100–199)
HDL: 55 mg/dL (ref >39)
LDL Chol Calc (NIH): 74 mg/dL (ref 0–99)
Triglycerides: 121 mg/dL (ref 0–149)
VLDL Cholesterol Cal: 21 mg/dL (ref 5–40)

## 2022-11-17 ENCOUNTER — Ambulatory Visit
Admission: EM | Admit: 2022-11-17 | Discharge: 2022-11-17 | Disposition: A | Discharge status: Home / Self Care | Payer: BC Managed Care – PPO | Attending: Family Medicine | Admitting: Family Medicine

## 2022-11-17 ENCOUNTER — Other Ambulatory Visit: Payer: Self-pay

## 2022-11-17 DIAGNOSIS — L241 Irritant contact dermatitis due to oils and greases: Secondary | ICD-10-CM

## 2022-11-17 MED ORDER — TRIAMCINOLONE ACETONIDE 0.1 % EX OINT: 1.0000 | TOPICAL_OINTMENT | Freq: Two times a day (BID) | CUTANEOUS | 0 refills | Status: DC

## 2022-11-17 MED ORDER — HYDROXYZINE HCL 25 MG PO TABS: 25.0000 mg | ORAL_TABLET | Freq: Four times a day (QID) | ORAL | 0 refills | Status: DC

## 2022-11-17 MED ORDER — DEXAMETHASONE SODIUM PHOSPHATE 10 MG/ML IJ SOLN
10.0000 mg | Freq: Once | INTRAMUSCULAR | Status: AC
  Administered 2022-11-17: 10 mg via INTRAMUSCULAR

## 2022-11-17 MED ORDER — CETIRIZINE HCL 10 MG PO TABS: ORAL_TABLET | ORAL | 5 refills | Status: DC

## 2022-11-17 MED ORDER — PREDNISONE 10 MG (21) PO TBPK: ORAL_TABLET | Freq: Every day | ORAL | 0 refills | Status: DC

## 2022-11-17 NOTE — ED Provider Notes (Signed)
[facility]    CSN: [id number] Arrival date & time: 11/17/22  1601      History   Chief Complaint Chief Complaint  Patient presents with   Poison Ivy    HPI Holly Villegas is a 62 y.o. female.   HPI  Holly Villegas presents for poison plant exposure on Friday. The rash started on her legs then spread all over her body.  She believes it was poisonous sumac.  Some of the areas are blistering and weeping.  Rash itches.  She has been putting calamine lotion on it without relief.  She has otherwise been in her normal state of health and has no additional concerns today.  There is been no nausea, vomiting, shortness of breath, chest tightness or palpitations.    Past Medical History:  Diagnosis Date   Cancer (HCC)    left sided breast ca   Diverticulosis    GERD (gastroesophageal reflux disease)    Hypercholesteremia    Hypertension     Patient Active Problem List   Diagnosis Date Noted   Prediabetes 10/28/2022   Gastroesophageal reflux disease without esophagitis 04/29/2022   PVC's (premature ventricular contractions) 10/27/2021   Aortic atherosclerosis (HCC) 01/19/2021   Migraine with aura and without status migrainosus, not intractable 07/09/2019   Lymphedema 02/03/2017   Lumbar degenerative disc disease 06/16/2016   Bilateral sciatica 06/11/2016   History of left breast cancer 03/26/2016   Left wrist tendonitis 06/09/2015   Plantar fasciitis, bilateral 11/29/2014   Vitamin D deficiency 11/29/2014   Dupuytren's contracture of foot 11/29/2014   Dyslipidemia 11/28/2014   Environmental and seasonal allergies 11/28/2014   Raynaud's syndrome without gangrene 11/28/2014   Carpal tunnel syndrome 11/28/2014   Benign colonic polyp 11/28/2014   Diverticulosis of colon without diverticulitis 11/28/2014   Essential hypertension 11/28/2014   Varicose veins of both lower extremities with pain 11/28/2014   Raynaud's phenomenon 11/28/2014    Past Surgical  History:  Procedure Laterality Date   BREAST BIOPSY Left 02/24/2016   path pending   CHOLECYSTECTOMY  2012   COLONOSCOPY WITH PROPOFOL N/A 10/13/2015   Procedure: COLONOSCOPY WITH PROPOFOL;  Surgeon: Wallace Cullens, MD;  Location: [facility] ENDOSCOPY;  Service: Gastroenterology;  Laterality: N/A;   ESOPHAGEAL DILATION  07/2009   THROAT SURGERY  2011   polyp removed from vocal cord   TUBAL LIGATION  1991    OB History   No obstetric history on file.      Home Medications    Prior to Admission medications   Medication Sig Start Date End Date Taking? Authorizing Provider  acetaminophen (TYLENOL) 500 MG tablet Take 1 tablet by mouth once as needed.   Yes [provider]  Ascorbic Acid (VITAMIN C PO) Take by mouth daily.   Yes [provider]  aspirin 81 MG chewable tablet Chew 81 mg by mouth daily. 08/29/21  Yes [provider]  atorvastatin (LIPITOR) 20 MG tablet Take 1 tablet (20 mg total) by mouth daily. 10/28/22  Yes Reubin Milan, MD  azelastine (ASTELIN) 0.1 % nasal spray PLACE 2 SPRAYS INTO BOTH NOSTRILS TWICE DAILY 10/28/22  Yes Reubin Milan, MD  Calcium-Magnesium-Vitamin D (CALCIUM MAGNESIUM PO) Take by mouth.   Yes [provider]  fluticasone (FLONASE) 50 MCG/ACT nasal spray PLACE 2 SPRAYS INTO BOTH NOSTRILS EVERY DAY 10/28/22  Yes Reubin Milan, MD  hydrOXYzine (ATARAX) 25 MG tablet Take 1 tablet (25 mg total) by mouth every 6 (six) hours. 11/17/22  Yes Mistina Coatney, DO  lisinopril (ZESTRIL) 20 MG tablet Take 1 tablet (20 mg total) by mouth daily. 10/28/22  Yes Reubin Milan, MD  Melatonin 10 MG TABS Take by mouth at bedtime as needed.   Yes [provider]  pantoprazole (PROTONIX) 20 MG tablet Take by mouth. 07/31/21  Yes [provider]  predniSONE (STERAPRED UNI-PAK 21 TAB) 10 MG (21) TBPK tablet Take by mouth daily. Take 6 tabs by mouth daily for 1, then 5 tabs for 1 day, then 4 tabs for 1 day, then 3 tabs for 1 day,  then 2 tabs for 1 day, then 1 tab for 1 day. 11/17/22  Yes Natacha Jepsen, [place]ard Meth, DO  Probiotic Product (PROBIOTIC ADVANCED PO) Take by mouth.   Yes [provider]  triamcinolone ointment (KENALOG) 0.1 % Apply 1 Application topically 2 (two) times daily. 11/17/22  Yes Ronan Dion, [place] Meth, DO  Vitamin A 2400 MCG (8000 UT) CAPS daily.   Yes [provider]  vitamin C (ASCORBIC ACID) 500 MG tablet Take 500 mg by mouth 2 (two) times daily.   Yes [provider]  VITAMIN D PO Take by mouth daily.   Yes [provider]  Zinc Citrate-Phytase (ZYTAZE) 25-500 MG CAPS daily.   Yes [provider]  cetirizine (ZYRTEC) 10 MG tablet TAKE (1) TABLET BY MOUTH EVERY DAY 11/17/22   Churchill Grimsley, DO  UNABLE TO FIND magnesium 250 mg tablet   1 tablet every day by oral route.    [provider]    Family History Family History  Problem Relation Age of Onset   Hypertension Mother    Migraines Mother    Peptic Ulcer Disease Father    Breast cancer Maternal Aunt 58    Social History Social History   Tobacco Use   Smoking status: Never   Smokeless tobacco: Never  Vaping Use   Vaping Use: Never used  Substance Use Topics   Alcohol use: Yes    Alcohol/week: 0.0 standard drinks of alcohol    Comment: social   Drug use: No     Allergies   Gluten meal, Shellfish allergy, Codeine sulfate, Shellfish-derived products, Sulfa antibiotics, Brassica oleracea, and Ceftin [cefuroxime]   Review of Systems Review of Systems :negative unless otherwise stated in HPI.      Physical Exam Triage Vital Signs ED Triage Vitals  Enc Vitals Group     BP 11/17/22 1618 (!) 144/85     Pulse Rate 11/17/22 1618 (!) 58     Resp 11/17/22 1618 18     Temp 11/17/22 1618 98.6 F (37 C)     Temp src --      SpO2 11/17/22 1618 98 %     Weight --      Height --      Head Circumference --      Peak Flow --      Pain Score 11/17/22 1619 5     Pain Loc --      Pain Edu? --       Excl. in GC? --    No data found.  Updated Vital Signs BP (!) 144/85   Pulse (!) 58   Temp 98.6 F (37 C)   Resp 18   SpO2 98%   Visual Acuity Right Eye Distance:   Left Eye Distance:   Bilateral Distance:    Right Eye Near:   Left Eye Near:    Bilateral Near:     Physical Exam  GEN: alert, well appearing female, in no acute distress  EYES:  no scleral injection or discharge CV: regular rate, distal pulses and RESP: no increased work of breathing MSK: Baseline range of motion, no gross deformities NEURO: alert, moves all extremities appropriately, normal gait PSYCH: Normal affect, appropriate speech and behavior  SKIN: warm and dry; blistering erythematous weeping lesions on bilateral upper and lower extremities and abdomen.  Excoriations present    UC Treatments / Results  Labs (all labs ordered are listed, but only abnormal results are displayed) Labs Reviewed - No data to display  EKG   Radiology No results found.  Procedures Procedures (including critical care time)  Medications Ordered in UC Medications  dexamethasone (DECADRON) injection 10 mg (10 mg Intramuscular Given 11/17/22 1637)    Initial Impression / Assessment and Plan / UC Course  I have reviewed the triage vital signs and the nursing notes.  Pertinent labs & imaging results that were available during my care of the patient were reviewed by me and considered in my medical decision making (see chart for details).     Patient is a 62 y.o. femalewho presents for rash.  Overall, patient is well-appearing and well-hydrated.  Vital signs stable.  Holly Villegas is afebrile.  Exam concerning for contact dermatitis.  Patient agreeable to Decadron 10 mg IM which was given.  Treat with steroid ointment, steroid taper, hydroxyzine and Zyrtec. No sign of infection to suggest antibiotics at this time.     Reviewed expectations regarding course of current medical issues.  All questions asked were  answered.  Outlined signs and symptoms indicating need for more acute intervention. Patient verbalized understanding. After Visit Summary given.   Final Clinical Impressions(s) / UC Diagnoses   Final diagnoses:  Irritant contact dermatitis due to oils     Discharge Instructions      Take Zyrtec twice a day.  Hydroxyzine can make you sleepy. Do not drive or operate machinery while taking this medication. Stop by the pharmacy to pick up your prescriptions.  Follow up with your primary care provider as needed.      ED Prescriptions     Medication Sig Dispense Auth. Provider   hydrOXYzine (ATARAX) 25 MG tablet Take 1 tablet (25 mg total) by mouth every 6 (six) hours. 12 tablet Lanis Storlie, DO   cetirizine (ZYRTEC) 10 MG tablet TAKE (1) TABLET BY MOUTH EVERY DAY 30 tablet Marcelles Clinard, DO   predniSONE (STERAPRED UNI-PAK 21 TAB) 10 MG (21) TBPK tablet Take by mouth daily. Take 6 tabs by mouth daily for 1, then 5 tabs for 1 day, then 4 tabs for 1 day, then 3 tabs for 1 day, then 2 tabs for 1 day, then 1 tab for 1 day. 21 tablet [place], DO   triamcinolone ointment (KENALOG) 0.1 % Apply 1 Application topically 2 (two) times daily. 30 g Katha Cabal, DO      PDMP not reviewed this encounter.              Katha Cabal, DO 11/20/22 [phone]

## 2022-11-17 NOTE — ED Triage Notes (Signed)
Pt states she was in contact with poison sumac and now has general body rash area started on right lower arm and now all over.

## 2022-11-17 NOTE — Discharge Instructions (Addendum)
Take Zyrtec twice a day.  Hydroxyzine can make you sleepy. Do not drive or operate machinery while taking this medication. Stop by the pharmacy to pick up your prescriptions.  Follow up with your primary care provider as needed.

## 2022-11-18 ENCOUNTER — Ambulatory Visit: Payer: BC Managed Care – PPO

## 2022-11-20 ENCOUNTER — Encounter: Payer: Self-pay | Admitting: Family Medicine

## 2023-01-21 ENCOUNTER — Ambulatory Visit: Payer: Self-pay

## 2023-01-21 NOTE — Telephone Encounter (Signed)
Message from Gleason P sent at 01/21/2023 12:51 PM EDT  Summary: =covid   Pt called saying she took a covid test on Wednesday and it was positive.  She is taking otc meds but just wants advise on the fatigue  CB#  [phone]         Chief Complaint: fatigue positive covid  Symptoms: has to sit down frequently to rest the will go back to chores, dry cough, low grade fever Frequency: Tuesday Pertinent Negatives: Patient denies chest pain SOB, vomiting and diarrhea Disposition: [] ED /[] Urgent Care (no appt availability in office) / [] Appointment(In office/virtual)/ []  [facility]/ [x] Home Care/ [] Refused Recommended Disposition /[] [place] [facility]/ []  Follow-up with PCP Additional Notes:   Reason for Disposition  Mild weakness or fatigue with acute minor illness (e.g., colds)  Answer Assessment - Initial Assessment Questions 1. DESCRIPTION: "Describe how you are feeling."     Sleeping at night then has to sit down and rest  2. SEVERITY: "How bad is it?"  "Can you stand and walk?"   - MILD (0-3): Feels weak or tired, but does not interfere with work, school or normal activities.   - MODERATE (4-7): Able to stand and walk; weakness interferes with work, school, or normal activities.   - SEVERE (8-10): Unable to stand or walk; unable to do usual activities.     Covid positive 3. ONSET: "When did these symptoms begin?" (e.g., hours, days, weeks, months)     Tues  4. CAUSE: "What do you think is causing the weakness or fatigue?" (e.g., not drinking enough fluids, medical problem, trouble sleeping)     covid 5. NEW MEDICINES:  "Have you started on any new medicines recently?" (e.g., opioid pain medicines, benzodiazepines, muscle relaxants, antidepressants, antihistamines, neuroleptics, beta blockers)     no 6. OTHER SYMPTOMS: "Do you have any other symptoms?" (e.g., chest pain, fever, cough, SOB, vomiting, diarrhea, bleeding, other areas of pain)     Dry cough low grade  fever,  Protocols used: Weakness (Generalized) and Fatigue-A-AH

## 2023-05-03 ENCOUNTER — Encounter: Payer: BC Managed Care – PPO | Admitting: Internal Medicine

## 2023-05-03 NOTE — Assessment & Plan Note (Deleted)
LDL is  Lab Results  Component Value Date   LDLCALC 74 10/28/2022    Current regimen is atorvastatin.  Tolerating medications well without issues.

## 2023-05-03 NOTE — Assessment & Plan Note (Deleted)
Controlled BP with normal exam. Current regimen is lisinopril 10 mg. Will continue same medications; encourage continued reduced sodium diet.

## 2023-05-03 NOTE — Progress Notes (Deleted)
Date:  05/03/2023   Name:  Holly Villegas   DOB:  21-Sep-1960   MRN:  [id number]   Chief Complaint: No chief complaint on file. Jaleh Vant Beal is a 62 y.o. female who presents today for her Complete Annual Exam. She feels {DESC; WELL/FAIRLY WELL/POORLY:18703}. She reports exercising ***. She reports she is sleeping {DESC; WELL/FAIRLY WELL/POORLY:18703}. Breast complaints ***.  Mammogram: 09/2022 DEXA: none Colonoscopy: 08/2021 repeat 10 yrs Pap: 06/2021 neg/neg  Health Maintenance Due  Topic Date Due   Zoster Vaccines- Shingrix (2 of 2) 06/21/2019   DTaP/Tdap/Td (2 - Td or Tdap) 06/21/2020   INFLUENZA VACCINE  01/20/2023   COVID-19 Vaccine (6 - 2023-24 season) 02/20/2023    Immunization History  Administered Date(s) Administered   Hep A / Hep B 01/20/2003   Hep B, Unspecified 01/20/2003   Hepatitis B 01/20/2003   Influenza,inj,quad, With Preservative 04/05/2019   Influenza-Unspecified 03/21/2020, 04/18/2021, 04/14/2022   Moderna Covid-19 Vaccine Bivalent Booster 45yrs & up 04/27/2021   Moderna Sars-Covid-2 Vaccination 08/27/2019, 10/04/2019, 04/14/2020, 04/14/2022   Tdap 06/21/2010   Zoster Recombinant(Shingrix) 12/26/2018, 04/26/2019   Zoster, Live 12/06/2013, 04/03/2019     Hypertension This is a chronic problem. The problem is controlled. Pertinent negatives include no chest pain or shortness of breath. Past treatments include ACE inhibitors. There is no history of kidney disease, CAD/MI or CVA.  Hyperlipidemia This is a chronic problem. The problem is controlled. Pertinent negatives include no chest pain, focal weakness, myalgias or shortness of breath. Current antihyperlipidemic treatment includes statins.  Gastroesophageal Reflux She complains of heartburn. She reports no chest pain. This is a recurrent problem. The problem occurs rarely. She has tried a PPI for the symptoms.    Review of Systems  Respiratory:  Negative for shortness of breath.    Cardiovascular:  Negative for chest pain.  Gastrointestinal:  Positive for heartburn.  Musculoskeletal:  Negative for myalgias.  Neurological:  Negative for focal weakness.     Lab Results  Component Value Date   NA 137 10/28/2022   K 5.0 10/28/2022   CO2 23 10/28/2022   GLUCOSE 94 10/28/2022   BUN 16 10/28/2022   CREATININE 0.78 10/28/2022   CALCIUM 9.3 10/28/2022   EGFR 85 10/28/2022   GFRNONAA >60 05/17/2020   Lab Results  Component Value Date   CHOL 150 10/28/2022   HDL 55 10/28/2022   LDLCALC 74 10/28/2022   TRIG 121 10/28/2022   CHOLHDL 2.7 10/28/2022   Lab Results  Component Value Date   TSH 2.950 04/29/2022   Lab Results  Component Value Date   HGBA1C 5.6 10/28/2022   Lab Results  Component Value Date   WBC 4.5 10/28/2022   HGB 11.9 10/28/2022   HCT 36.3 10/28/2022   MCV 88 10/28/2022   PLT 282 10/28/2022   Lab Results  Component Value Date   ALT 17 10/28/2022   AST 18 10/28/2022   ALKPHOS 87 10/28/2022   BILITOT <0.2 10/28/2022   Lab Results  Component Value Date   VD25OH 65.7 12/27/2017     Patient Active Problem List   Diagnosis Date Noted   Prediabetes 10/28/2022   Gastroesophageal reflux disease without esophagitis 04/29/2022   PVC's (premature ventricular contractions) 10/27/2021   Aortic atherosclerosis (HCC) 01/19/2021   Migraine with aura and without status migrainosus, not intractable 07/09/2019   Lymphedema 02/03/2017   Lumbar degenerative disc disease 06/16/2016   Bilateral sciatica 06/11/2016   History of left breast cancer 03/26/2016  Left wrist tendonitis 06/09/2015   Plantar fasciitis, bilateral 11/29/2014   Vitamin D deficiency 11/29/2014   Dupuytren's contracture of foot 11/29/2014   Dyslipidemia 11/28/2014   Environmental and seasonal allergies 11/28/2014   Raynaud's syndrome without gangrene 11/28/2014   Carpal tunnel syndrome 11/28/2014   Benign colonic polyp 11/28/2014   Diverticulosis of colon without  diverticulitis 11/28/2014   Essential hypertension 11/28/2014   Varicose veins of both lower extremities with pain 11/28/2014   Raynaud's phenomenon 11/28/2014    Allergies  Allergen Reactions   Gluten Meal     Other reaction(s): Abdominal Pain   Shellfish Allergy     Other reaction(s): Vomiting   Codeine Sulfate Nausea And Vomiting   Shellfish-Derived Products    Sulfa Antibiotics Nausea And Vomiting   Brassica Oleracea Nausea And Vomiting and Other (See Comments)    Does not digest and vomits up   Ceftin [Cefuroxime] Diarrhea    Nausea and diarrhea    Past Surgical History:  Procedure Laterality Date   BREAST BIOPSY Left 02/24/2016   path pending   CHOLECYSTECTOMY  2012   COLONOSCOPY WITH PROPOFOL N/A 10/13/2015   Procedure: COLONOSCOPY WITH PROPOFOL;  Surgeon: Wallace Cullens, MD;  Location: [facility] ENDOSCOPY;  Service: Gastroenterology;  Laterality: N/A;   ESOPHAGEAL DILATION  07/2009   THROAT SURGERY  2011   polyp removed from vocal cord   TUBAL LIGATION  1991    Social History   Tobacco Use   Smoking status: Never   Smokeless tobacco: Never  Vaping Use   Vaping status: Never Used  Substance Use Topics   Alcohol use: Yes    Alcohol/week: 0.0 standard drinks of alcohol    Comment: social   Drug use: No     Medication list has been reviewed and updated.  No outpatient medications have been marked as taking for the 05/03/23 encounter (Appointment) with Reubin Milan, MD.       10/28/2022    8:30 AM 04/29/2022    8:24 AM 10/27/2021    9:02 AM 10/16/2021    1:31 PM  GAD 7 : Generalized Anxiety Score  Nervous, Anxious, on Edge 0 0 0 0  Control/stop worrying 0 0 0 0  Worry too much - different things 0 0 0 0  Trouble relaxing 0 0 0 0  Restless 0 0 0 0  Easily annoyed or irritable 0 0 0 0  Afraid - awful might happen 0 0 0 0  Total GAD 7 Score 0 0 0 0  Anxiety Difficulty Not difficult at all Not difficult at all Not difficult at all        10/28/2022    8:30  AM 04/29/2022    8:24 AM 10/27/2021    9:02 AM  Depression screen PHQ 2/9  Decreased Interest 0 0 0  Down, Depressed, Hopeless 0 0 0  PHQ - 2 Score 0 0 0  Altered sleeping 0 0 0  Tired, decreased energy 0 1 0  Change in appetite 0 1 1  Feeling bad or failure about yourself  0 0 0  Trouble concentrating 0 0 0  Moving slowly or fidgety/restless 0 0 0  Suicidal thoughts 0 0 0  PHQ-9 Score 0 2 1  Difficult doing work/chores Not difficult at all Not difficult at all Not difficult at all    BP Readings from Last 3 Encounters:  11/17/22 (!) 144/85  10/28/22 118/70  10/15/22 122/76    Physical Exam Vitals and nursing  note reviewed.  Constitutional:      General: She is not in acute distress.    Appearance: She is well-developed.  HENT:     Head: Normocephalic and atraumatic.     Right Ear: Tympanic membrane and ear canal normal.     Left Ear: Tympanic membrane and ear canal normal.     Nose:     Right Sinus: No maxillary sinus tenderness.     Left Sinus: No maxillary sinus tenderness.  Eyes:     General: No scleral icterus.       Right eye: No discharge.        Left eye: No discharge.     Conjunctiva/sclera: Conjunctivae normal.  Neck:     Thyroid: No thyromegaly.     Vascular: No carotid bruit.  Cardiovascular:     Rate and Rhythm: Normal rate and regular rhythm.     Pulses: Normal pulses.     Heart sounds: Normal heart sounds.  Pulmonary:     Effort: Pulmonary effort is normal. No respiratory distress.     Breath sounds: No wheezing.  Abdominal:     General: Bowel sounds are normal.     Palpations: Abdomen is soft.     Tenderness: There is no abdominal tenderness.  Musculoskeletal:     Cervical back: Normal range of motion. No erythema.     Right lower leg: No edema.     Left lower leg: No edema.  Lymphadenopathy:     Cervical: No cervical adenopathy.  Skin:    General: Skin is warm and dry.     Findings: No rash.  Neurological:     Mental Status: She is alert  and oriented to person, place, and time.     Cranial Nerves: No cranial nerve deficit.     Sensory: No sensory deficit.     Deep Tendon Reflexes: Reflexes are normal and symmetric.  Psychiatric:        Attention and Perception: Attention normal.        Mood and Affect: Mood normal.     Wt Readings from Last 3 Encounters:  10/28/22 161 lb (73 kg)  10/15/22 163 lb (73.9 kg)  04/29/22 189 lb (85.7 kg)    There were no vitals taken for this visit.  Assessment and Plan:  Problem List Items Addressed This Visit   None   No follow-ups on file.    Reubin Milan, MD [facility] [place]

## 2023-05-03 NOTE — Assessment & Plan Note (Deleted)
Reflux symptoms are minimal on current therapy - pantoprazole. No red flag signs such as weight loss, n/v, melena  

## 2023-05-03 NOTE — Assessment & Plan Note (Deleted)
On daily supplement 

## 2023-05-03 NOTE — Assessment & Plan Note (Deleted)
Managed with diet and exercise Lab Results  Component Value Date   HGBA1C 5.6 10/28/2022

## 2023-05-25 ENCOUNTER — Other Ambulatory Visit: Payer: Self-pay | Admitting: Internal Medicine

## 2023-05-25 DIAGNOSIS — I10 Essential (primary) hypertension: Secondary | ICD-10-CM

## 2023-05-25 DIAGNOSIS — E785 Hyperlipidemia, unspecified: Secondary | ICD-10-CM

## 2023-05-26 ENCOUNTER — Ambulatory Visit (INDEPENDENT_AMBULATORY_CARE_PROVIDER_SITE_OTHER): Payer: BC Managed Care – PPO | Admitting: Internal Medicine

## 2023-05-26 ENCOUNTER — Encounter: Payer: Self-pay | Admitting: Internal Medicine

## 2023-05-26 VITALS — BP 122/78 | HR 56 | Ht 64.0 in | Wt 162.0 lb

## 2023-05-26 DIAGNOSIS — B351 Tinea unguium: Secondary | ICD-10-CM

## 2023-05-26 DIAGNOSIS — R7303 Prediabetes: Secondary | ICD-10-CM

## 2023-05-26 DIAGNOSIS — E785 Hyperlipidemia, unspecified: Secondary | ICD-10-CM

## 2023-05-26 DIAGNOSIS — I10 Essential (primary) hypertension: Secondary | ICD-10-CM

## 2023-05-26 DIAGNOSIS — Z1231 Encounter for screening mammogram for malignant neoplasm of breast: Secondary | ICD-10-CM

## 2023-05-26 DIAGNOSIS — I700 Atherosclerosis of aorta: Secondary | ICD-10-CM

## 2023-05-26 DIAGNOSIS — Z0000 Encounter for general adult medical examination without abnormal findings: Secondary | ICD-10-CM

## 2023-05-26 DIAGNOSIS — Z853 Personal history of malignant neoplasm of breast: Secondary | ICD-10-CM

## 2023-05-26 MED ORDER — LISINOPRIL 20 MG PO TABS: 20.0000 mg | ORAL_TABLET | Freq: Every day | ORAL | 1 refills | Status: DC

## 2023-05-26 MED ORDER — ATORVASTATIN CALCIUM 20 MG PO TABS: 20.0000 mg | ORAL_TABLET | Freq: Every day | ORAL | 1 refills | Status: DC

## 2023-05-26 MED ORDER — CETIRIZINE HCL 10 MG PO TABS: ORAL_TABLET | ORAL | 5 refills | Status: DC

## 2023-05-26 NOTE — Assessment & Plan Note (Signed)
Followed by GYN; gets mammogram in [facility]

## 2023-05-26 NOTE — Progress Notes (Signed)
Date:  05/26/2023   Name:  Holly Villegas   DOB:  1961/04/25   MRN:  [id number]   Chief Complaint: Annual Exam Arri Kochan Villegas is a 62 y.o. female who presents today for her Complete Annual Exam. She feels well. She reports exercising some. She reports she is sleeping well. Breast complaints - none.  Mammogram: 09/2022 DEXA: none Colonoscopy: 08/2021 repeat 10 yr Pap: 06/2021 neg/neg  Health Maintenance Due  Topic Date Due   Zoster Vaccines- Shingrix (2 of 2) 06/21/2019   DTaP/Tdap/Td (2 - Td or Tdap) 06/21/2020   COVID-19 Vaccine (6 - 2023-24 season) 02/20/2023    Immunization History  Administered Date(s) Administered   Hep A / Hep B 01/20/2003   Hep B, Unspecified 01/20/2003   Hepatitis B 01/20/2003   Influenza,inj,quad, With Preservative 04/05/2019   Influenza-Unspecified 04/18/2021, 04/14/2022, 04/29/2023   Moderna Covid-19 Vaccine Bivalent Booster 4yrs & up 04/27/2021   Moderna Sars-Covid-2 Vaccination 08/27/2019, 10/04/2019, 04/14/2020, 04/14/2022   Tdap 06/21/2010   Zoster Recombinant(Shingrix) 12/26/2018, 04/26/2019   Zoster, Live 12/06/2013, 04/03/2019     Hypertension This is a chronic problem. The problem is controlled. Pertinent negatives include no chest pain or shortness of breath. Past treatments include ACE inhibitors. The current treatment provides significant improvement. There is no history of kidney disease, CAD/MI or CVA.  Hyperlipidemia This is a chronic problem. The problem is controlled. There are no known factors aggravating her hyperlipidemia. Associated symptoms include myalgias (right bicep tendon injury). Pertinent negatives include no chest pain, focal sensory loss, leg pain or shortness of breath. Current antihyperlipidemic treatment includes statins. The current treatment provides significant improvement of lipids.    Review of Systems  Constitutional:  Negative for chills, fever and unexpected weight change.  HENT:  Negative  for trouble swallowing.   Respiratory:  Negative for chest tightness and shortness of breath.   Cardiovascular:  Negative for chest pain.  Gastrointestinal:  Negative for abdominal pain, constipation and diarrhea.  Genitourinary:  Negative for dysuria and urgency.  Musculoskeletal:  Positive for myalgias (right bicep tendon injury).  Psychiatric/Behavioral:  Negative for dysphoric mood and sleep disturbance. The patient is not nervous/anxious.      Lab Results  Component Value Date   NA 137 10/28/2022   K 5.0 10/28/2022   CO2 23 10/28/2022   GLUCOSE 94 10/28/2022   BUN 16 10/28/2022   CREATININE 0.78 10/28/2022   CALCIUM 9.3 10/28/2022   EGFR 85 10/28/2022   GFRNONAA >60 05/17/2020   Lab Results  Component Value Date   CHOL 150 10/28/2022   HDL 55 10/28/2022   LDLCALC 74 10/28/2022   TRIG 121 10/28/2022   CHOLHDL 2.7 10/28/2022   Lab Results  Component Value Date   TSH 2.950 04/29/2022   Lab Results  Component Value Date   HGBA1C 5.6 10/28/2022   Lab Results  Component Value Date   WBC 4.5 10/28/2022   HGB 11.9 10/28/2022   HCT 36.3 10/28/2022   MCV 88 10/28/2022   PLT 282 10/28/2022   Lab Results  Component Value Date   ALT 17 10/28/2022   AST 18 10/28/2022   ALKPHOS 87 10/28/2022   BILITOT <0.2 10/28/2022   Lab Results  Component Value Date   VD25OH 65.7 12/27/2017     Patient Active Problem List   Diagnosis Date Noted   Onychomycosis 05/26/2023   Prediabetes 10/28/2022   Gastroesophageal reflux disease without esophagitis 04/29/2022   PVC's (premature ventricular contractions) 10/27/2021  Aortic atherosclerosis (HCC) 01/19/2021   Migraine with aura and without status migrainosus, not intractable 07/09/2019   Lymphedema 02/03/2017   Lumbar degenerative disc disease 06/16/2016   Bilateral sciatica 06/11/2016   History of left breast cancer 03/26/2016   Vitamin D deficiency 11/29/2014   Dupuytren's contracture of foot 11/29/2014   Dyslipidemia  11/28/2014   Environmental and seasonal allergies 11/28/2014   Raynaud's syndrome without gangrene 11/28/2014   Carpal tunnel syndrome 11/28/2014   Benign colonic polyp 11/28/2014   Diverticulosis of colon without diverticulitis 11/28/2014   Essential hypertension 11/28/2014   Varicose veins of both lower extremities with pain 11/28/2014    Allergies  Allergen Reactions   Gluten Meal     Other reaction(s): Abdominal Pain   Shellfish Allergy     Other reaction(s): Vomiting   Codeine Sulfate Nausea And Vomiting   Shellfish-Derived Products    Sulfa Antibiotics Nausea And Vomiting   Brassica Oleracea Nausea And Vomiting and Other (See Comments)    Does not digest and vomits up   Ceftin [Cefuroxime] Diarrhea    Nausea and diarrhea    Past Surgical History:  Procedure Laterality Date   BREAST BIOPSY Left 02/24/2016   path pending   CHOLECYSTECTOMY  2012   COLONOSCOPY WITH PROPOFOL N/A 10/13/2015   Procedure: COLONOSCOPY WITH PROPOFOL;  Surgeon: Wallace Cullens, MD;  Location: [facility] ENDOSCOPY;  Service: Gastroenterology;  Laterality: N/A;   ESOPHAGEAL DILATION  07/2009   THROAT SURGERY  2011   polyp removed from vocal cord   TUBAL LIGATION  1991    Social History   Tobacco Use   Smoking status: Never   Smokeless tobacco: Never  Vaping Use   Vaping status: Never Used  Substance Use Topics   Alcohol use: Yes    Alcohol/week: 0.0 standard drinks of alcohol    Comment: social   Drug use: No     Medication list has been reviewed and updated.  Current Meds  Medication Sig   acetaminophen (TYLENOL) 500 MG tablet Take 1 tablet by mouth once as needed.   Ascorbic Acid (VITAMIN C PO) Take by mouth daily.   aspirin 81 MG chewable tablet Chew 81 mg by mouth daily.   azelastine (ASTELIN) 0.1 % nasal spray PLACE 2 SPRAYS INTO BOTH NOSTRILS TWICE DAILY   Biotin 1 MG CAPS Take by mouth.   Calcium-Magnesium-Vitamin D (CALCIUM MAGNESIUM PO) Take by mouth.   fluticasone (FLONASE) 50  MCG/ACT nasal spray PLACE 2 SPRAYS INTO BOTH NOSTRILS EVERY DAY   Melatonin 10 MG TABS Take by mouth at bedtime as needed.   pantoprazole (PROTONIX) 20 MG tablet Take by mouth.   Probiotic Product (PROBIOTIC ADVANCED PO) Take by mouth.   terbinafine (LAMISIL) 1 % cream Apply 1 Application topically 2 (two) times daily.   UNABLE TO FIND magnesium 250 mg tablet   1 tablet every day by oral route.   Vitamin A 2400 MCG (8000 UT) CAPS daily.   vitamin C (ASCORBIC ACID) 500 MG tablet Take 500 mg by mouth 2 (two) times daily.   VITAMIN D PO Take by mouth daily.   Zinc Citrate-Phytase (ZYTAZE) 25-500 MG CAPS daily.   [DISCONTINUED] atorvastatin (LIPITOR) 20 MG tablet Take 1 tablet (20 mg total) by mouth daily.   [DISCONTINUED] cetirizine (ZYRTEC) 10 MG tablet TAKE (1) TABLET BY MOUTH EVERY DAY   [DISCONTINUED] lisinopril (ZESTRIL) 20 MG tablet Take 1 tablet (20 mg total) by mouth daily.       05/26/2023  8:40 AM 10/28/2022    8:30 AM 04/29/2022    8:24 AM 10/27/2021    9:02 AM  GAD 7 : Generalized Anxiety Score  Nervous, Anxious, on Edge 0 0 0 0  Control/stop worrying 0 0 0 0  Worry too much - different things 3 0 0 0  Trouble relaxing 0 0 0 0  Restless 0 0 0 0  Easily annoyed or irritable 0 0 0 0  Afraid - awful might happen 0 0 0 0  Total GAD 7 Score 3 0 0 0  Anxiety Difficulty Not difficult at all Not difficult at all Not difficult at all Not difficult at all       05/26/2023    8:40 AM 10/28/2022    8:30 AM 04/29/2022    8:24 AM  Depression screen PHQ 2/9  Decreased Interest 0 0 0  Down, Depressed, Hopeless 0 0 0  PHQ - 2 Score 0 0 0  Altered sleeping 1 0 0  Tired, decreased energy 0 0 1  Change in appetite 0 0 1  Feeling bad or failure about yourself  0 0 0  Trouble concentrating 1 0 0  Moving slowly or fidgety/restless 0 0 0  Suicidal thoughts 0 0 0  PHQ-9 Score 2 0 2  Difficult doing work/chores Not difficult at all Not difficult at all Not difficult at all    BP Readings  from Last 3 Encounters:  05/26/23 122/78  11/17/22 (!) 144/85  10/28/22 118/70    Physical Exam Vitals and nursing note reviewed.  Constitutional:      General: She is not in acute distress.    Appearance: She is well-developed.  HENT:     Head: Normocephalic and atraumatic.     Right Ear: Tympanic membrane and ear canal normal.     Left Ear: Tympanic membrane and ear canal normal.     Nose:     Right Sinus: No maxillary sinus tenderness.     Left Sinus: No maxillary sinus tenderness.  Eyes:     General: No scleral icterus.       Right eye: No discharge.        Left eye: No discharge.     Conjunctiva/sclera: Conjunctivae normal.  Neck:     Thyroid: No thyromegaly.     Vascular: No carotid bruit.  Cardiovascular:     Rate and Rhythm: Normal rate and regular rhythm.     Pulses: Normal pulses.     Heart sounds: Normal heart sounds.  Pulmonary:     Effort: Pulmonary effort is normal. No respiratory distress.     Breath sounds: No wheezing.  Abdominal:     General: Bowel sounds are normal.     Palpations: Abdomen is soft.     Tenderness: There is no abdominal tenderness.  Musculoskeletal:     Cervical back: Normal range of motion. No erythema.     Right lower leg: No edema.     Left lower leg: No edema.  Lymphadenopathy:     Cervical: No cervical adenopathy.  Skin:    General: Skin is warm and dry.     Capillary Refill: Capillary refill takes less than 2 seconds.     Findings: No rash.  Neurological:     Mental Status: She is alert and oriented to person, place, and time.     Cranial Nerves: No cranial nerve deficit.     Sensory: No sensory deficit.     Deep Tendon Reflexes: Reflexes  are normal and symmetric.  Psychiatric:        Attention and Perception: Attention normal.        Mood and Affect: Mood normal.     Wt Readings from Last 3 Encounters:  05/26/23 162 lb (73.5 kg)  10/28/22 161 lb (73 kg)  10/15/22 163 lb (73.9 kg)    BP 122/78   Pulse (!) 56    Ht 5\' 4"  (1.626 m)   Wt 162 lb (73.5 kg)   SpO2 100%   BMI 27.81 kg/m   Assessment and Plan:  Problem List Items Addressed This Visit       Unprioritized   Dyslipidemia (Chronic)    LDL is  Lab Results  Component Value Date   LDLCALC 74 10/28/2022    Current regimen is atorvastatin.  Tolerating medications well without issues.       Relevant Medications   atorvastatin (LIPITOR) 20 MG tablet   Other Relevant Orders   Lipid panel   Essential hypertension (Chronic)    Controlled BP with normal exam. Current regimen is lisinopril. Will continue same medications; encourage continued reduced sodium diet.       Relevant Medications   atorvastatin (LIPITOR) 20 MG tablet   lisinopril (ZESTRIL) 20 MG tablet   Other Relevant Orders   CBC with Differential/Platelet   Comprehensive metabolic panel   TSH   Urinalysis, Routine w reflex microscopic   History of left breast cancer (Chronic)    Followed by GYN; gets mammogram in [facility]      Aortic atherosclerosis (HCC) (Chronic)    On statin for primary prevention      Relevant Medications   atorvastatin (LIPITOR) 20 MG tablet   lisinopril (ZESTRIL) 20 MG tablet   Prediabetes (Chronic)    Managed with diet. Lab Results  Component Value Date   HGBA1C 5.6 10/28/2022         Relevant Orders   Hemoglobin A1c   Onychomycosis    Seen by Dermatology and prescribed Lamisil Wants to make sure that liver is healthy before starting      Relevant Medications   terbinafine (LAMISIL) 1 % cream   Other Visit Diagnoses     Annual physical exam    -  Primary   up to date on screening and immunizations continue regular exercise, healthy diet.   Encounter for screening mammogram for breast cancer       patient will schedule at [facility]       Return in about 6 months (around 11/24/2023) for HTN.    Reubin Milan, MD [facility] [place]

## 2023-05-26 NOTE — Assessment & Plan Note (Signed)
Managed with diet. Lab Results  Component Value Date   HGBA1C 5.6 10/28/2022

## 2023-05-26 NOTE — Assessment & Plan Note (Signed)
On statin for primary prevention

## 2023-05-26 NOTE — Assessment & Plan Note (Signed)
 Controlled BP with normal exam. Current regimen is lisinopril. Will continue same medications; encourage continued reduced sodium diet.

## 2023-05-26 NOTE — Assessment & Plan Note (Signed)
Seen by Dermatology and prescribed Lamisil Wants to make sure that liver is healthy before starting

## 2023-05-26 NOTE — Assessment & Plan Note (Signed)
 LDL is  Lab Results  Component Value Date   LDLCALC 74 10/28/2022    Current regimen is atorvastatin.  Tolerating medications well without issues.

## 2023-05-27 LAB — CBC WITH DIFFERENTIAL/PLATELET
Basophils Absolute: 0 10*3/uL (ref 0.0–0.2)
Basos: 1 %
EOS (ABSOLUTE): 0.1 10*3/uL (ref 0.0–0.4)
Eos: 3 %
Hematocrit: 39.6 % (ref 34.0–46.6)
Hemoglobin: 12.9 g/dL (ref 11.1–15.9)
Immature Grans (Abs): 0 10*3/uL (ref 0.0–0.1)
Immature Granulocytes: 0 %
Lymphocytes Absolute: 1.7 10*3/uL (ref 0.7–3.1)
Lymphs: 38 %
MCH: 29.4 pg (ref 26.6–33.0)
MCHC: 32.6 g/dL (ref 31.5–35.7)
MCV: 90 fL (ref 79–97)
Monocytes Absolute: 0.4 10*3/uL (ref 0.1–0.9)
Monocytes: 9 %
Neutrophils Absolute: 2.2 10*3/uL (ref 1.4–7.0)
Neutrophils: 49 %
Platelets: 254 10*3/uL (ref 150–450)
RBC: 4.39 x10E6/uL (ref 3.77–5.28)
RDW: 12.3 % (ref 11.7–15.4)
WBC: 4.6 10*3/uL (ref 3.4–10.8)

## 2023-05-27 LAB — COMPREHENSIVE METABOLIC PANEL
ALT: 19 [IU]/L (ref 0–32)
AST: 24 [IU]/L (ref 0–40)
Albumin: 4.3 g/dL (ref 3.9–4.9)
Alkaline Phosphatase: 77 [IU]/L (ref 44–121)
BUN/Creatinine Ratio: 15 (ref 12–28)
BUN: 12 mg/dL (ref 8–27)
Bilirubin Total: 0.3 mg/dL (ref 0.0–1.2)
CO2: 25 mmol/L (ref 20–29)
Calcium: 9.4 mg/dL (ref 8.7–10.3)
Chloride: 103 mmol/L (ref 96–106)
Creatinine, Ser: 0.79 mg/dL (ref 0.57–1.00)
Globulin, Total: 2.3 g/dL (ref 1.5–4.5)
Glucose: 95 mg/dL (ref 70–99)
Potassium: 4.7 mmol/L (ref 3.5–5.2)
Sodium: 139 mmol/L (ref 134–144)
Total Protein: 6.6 g/dL (ref 6.0–8.5)
eGFR: 83 mL/min/{1.73_m2} (ref >59)

## 2023-05-27 LAB — LIPID PANEL
Chol/HDL Ratio: 2.4 {ratio} (ref 0.0–4.4)
Cholesterol, Total: 184 mg/dL (ref 100–199)
HDL: 77 mg/dL (ref >39)
LDL Chol Calc (NIH): 90 mg/dL (ref 0–99)
Triglycerides: 97 mg/dL (ref 0–149)
VLDL Cholesterol Cal: 17 mg/dL (ref 5–40)

## 2023-05-27 LAB — HEMOGLOBIN A1C
Est. average glucose Bld gHb Est-mCnc: 114 mg/dL
Hgb A1c MFr Bld: 5.6 % (ref 4.8–5.6)

## 2023-05-27 LAB — URINALYSIS, ROUTINE W REFLEX MICROSCOPIC
Bilirubin, UA: NEGATIVE
Glucose, UA: NEGATIVE
Ketones, UA: NEGATIVE
Leukocytes,UA: NEGATIVE
Nitrite, UA: NEGATIVE
Protein,UA: NEGATIVE
RBC, UA: NEGATIVE
Specific Gravity, UA: 1.014 (ref 1.005–1.030)
Urobilinogen, Ur: 0.2 mg/dL (ref 0.2–1.0)
pH, UA: 7.5 (ref 5.0–7.5)

## 2023-05-27 LAB — TSH: TSH: 2.81 u[IU]/mL (ref 0.450–4.500)

## 2023-10-18 LAB — HM MAMMOGRAPHY

## 2023-11-09 ENCOUNTER — Encounter: Payer: Self-pay | Admitting: Internal Medicine

## 2023-11-09 ENCOUNTER — Ambulatory Visit: Payer: Self-pay | Admitting: Internal Medicine

## 2023-11-09 VITALS — BP 116/70 | HR 55 | Ht 64.0 in | Wt 168.5 lb

## 2023-11-09 DIAGNOSIS — I10 Essential (primary) hypertension: Secondary | ICD-10-CM

## 2023-11-09 DIAGNOSIS — E785 Hyperlipidemia, unspecified: Secondary | ICD-10-CM

## 2023-11-09 DIAGNOSIS — R7303 Prediabetes: Secondary | ICD-10-CM | POA: Diagnosis not present

## 2023-11-09 MED ORDER — ATORVASTATIN CALCIUM 20 MG PO TABS: 20.0000 mg | ORAL_TABLET | Freq: Every day | ORAL | 3 refills | Status: DC

## 2023-11-09 MED ORDER — LISINOPRIL 20 MG PO TABS
20.0000 mg | ORAL_TABLET | Freq: Every day | ORAL | 3 refills | Status: AC
Start: 2023-11-09 — End: ?

## 2023-11-09 NOTE — Assessment & Plan Note (Signed)
 Managed with diet and exercise, Some recent weight gain - will repeat A1C and advise.

## 2023-11-09 NOTE — Assessment & Plan Note (Signed)
 Blood pressure is well controlled.  Current medications are lisinopril. Will continue same regimen along with efforts to limit dietary sodium.

## 2023-11-09 NOTE — Assessment & Plan Note (Signed)
 LDL is  Lab Results  Component Value Date   LDLCALC 90 05/26/2023   Current regimen is atorvastatin .  No medication side effects noted. Goal LDL is <70.

## 2023-11-09 NOTE — Progress Notes (Signed)
 Date:  11/09/2023   Name:  Holly Villegas   DOB:  February 02, 1961   MRN:  [id number]   Chief Complaint: Hypertension  Hypertension This is a chronic problem. The problem is controlled. Pertinent negatives include no chest pain, headaches, palpitations or shortness of breath.  Hyperlipidemia This is a chronic problem. The problem is controlled. Pertinent negatives include no chest pain, myalgias or shortness of breath. Current antihyperlipidemic treatment includes statins. The current treatment provides significant improvement of lipids.    Review of Systems  Constitutional:  Positive for unexpected weight change (gained about 10 lbs). Negative for fatigue.  HENT:  Negative for trouble swallowing.   Eyes:  Negative for visual disturbance.  Respiratory:  Negative for cough, chest tightness, shortness of breath and wheezing.   Cardiovascular:  Negative for chest pain, palpitations and leg swelling.  Gastrointestinal:  Negative for abdominal pain, constipation and diarrhea.  Musculoskeletal:  Negative for arthralgias and myalgias.  Neurological:  Negative for dizziness, weakness, light-headedness and headaches.  Psychiatric/Behavioral:  Negative for dysphoric mood and sleep disturbance. The patient is not nervous/anxious.      Lab Results  Component Value Date   NA 139 05/26/2023   K 4.7 05/26/2023   CO2 25 05/26/2023   GLUCOSE 95 05/26/2023   BUN 12 05/26/2023   CREATININE 0.79 05/26/2023   CALCIUM  9.4 05/26/2023   EGFR 83 05/26/2023   GFRNONAA >60 05/17/2020   Lab Results  Component Value Date   CHOL 184 05/26/2023   HDL 77 05/26/2023   LDLCALC 90 05/26/2023   TRIG 97 05/26/2023   CHOLHDL 2.4 05/26/2023   Lab Results  Component Value Date   TSH 2.810 05/26/2023   Lab Results  Component Value Date   HGBA1C 5.6 05/26/2023   Lab Results  Component Value Date   WBC 4.6 05/26/2023   HGB 12.9 05/26/2023   HCT 39.6 05/26/2023   MCV 90 05/26/2023   PLT 254  05/26/2023   Lab Results  Component Value Date   ALT 19 05/26/2023   AST 24 05/26/2023   ALKPHOS 77 05/26/2023   BILITOT 0.3 05/26/2023   Lab Results  Component Value Date   VD25OH 65.7 12/27/2017     Patient Active Problem List   Diagnosis Date Noted   Onychomycosis 05/26/2023   Prediabetes 10/28/2022   Gastroesophageal reflux disease without esophagitis 04/29/2022   PVC's (premature ventricular contractions) 10/27/2021   Aortic atherosclerosis (HCC) 01/19/2021   Migraine with aura and without status migrainosus, not intractable 07/09/2019   Lymphedema 02/03/2017   Lumbar degenerative disc disease 06/16/2016   Bilateral sciatica 06/11/2016   History of left breast cancer 03/26/2016   Vitamin D  deficiency 11/29/2014   Dupuytren's contracture of foot 11/29/2014   Dyslipidemia 11/28/2014   Environmental and seasonal allergies 11/28/2014   Raynaud's syndrome without gangrene 11/28/2014   Carpal tunnel syndrome 11/28/2014   Benign colonic polyp 11/28/2014   Diverticulosis of colon without diverticulitis 11/28/2014   Essential hypertension 11/28/2014   Varicose veins of both lower extremities with pain 11/28/2014    Allergies  Allergen Reactions   Gluten Meal     Other reaction(s): Abdominal Pain   Shellfish Allergy     Other reaction(s): Vomiting   Codeine Sulfate Nausea And Vomiting   Shellfish-Derived Products    Sulfa Antibiotics Nausea And Vomiting   Brassica Oleracea Nausea And Vomiting and Other (See Comments)    Does not digest and vomits up   Ceftin  [Cefuroxime ] Diarrhea  Nausea and diarrhea    Past Surgical History:  Procedure Laterality Date   BREAST BIOPSY Left 02/24/2016   path pending   CHOLECYSTECTOMY  2012   COLONOSCOPY WITH PROPOFOL  N/A 10/13/2015   Procedure: COLONOSCOPY WITH PROPOFOL ;  Surgeon: Stephens Eis, MD;  Location: [facility] ENDOSCOPY;  Service: Gastroenterology;  Laterality: N/A;   ESOPHAGEAL DILATION  07/2009   THROAT SURGERY  2011    polyp removed from vocal cord   TUBAL LIGATION  1991    Social History   Tobacco Use   Smoking status: Never   Smokeless tobacco: Never  Vaping Use   Vaping status: Never Used  Substance Use Topics   Alcohol use: Yes    Alcohol/week: 0.0 standard drinks of alcohol    Comment: social   Drug use: No     Medication list has been reviewed and updated.  Current Meds  Medication Sig   acetaminophen (TYLENOL) 500 MG tablet Take 1 tablet by mouth once as needed.   Ascorbic Acid (VITAMIN C PO) Take by mouth daily.   aspirin 81 MG chewable tablet Chew 81 mg by mouth daily.   azelastine  (ASTELIN ) 0.1 % nasal spray PLACE 2 SPRAYS INTO BOTH NOSTRILS TWICE DAILY   Biotin 1 MG CAPS Take by mouth.   Calcium -Magnesium-Vitamin D  (CALCIUM  MAGNESIUM PO) Take by mouth.   cetirizine  (ZYRTEC ) 10 MG tablet TAKE (1) TABLET BY MOUTH EVERY DAY   fluticasone  (FLONASE ) 50 MCG/ACT nasal spray PLACE 2 SPRAYS INTO BOTH NOSTRILS EVERY DAY   Ginkgo Biloba 40 MG TABS Take by mouth.   Melatonin 10 MG TABS Take by mouth at bedtime as needed.   meloxicam  (MOBIC ) 7.5 MG tablet Take 7.5 mg by mouth 2 (two) times daily.   Omega-3 Fatty Acids (FISH OIL PO) Take by mouth.   pantoprazole (PROTONIX) 20 MG tablet Take by mouth.   Probiotic Product (PROBIOTIC ADVANCED PO) Take by mouth.   Tavaborole 5 % SOLN Apply 1 Application topically daily.   UNABLE TO FIND magnesium 250 mg tablet   1 tablet every day by oral route.   Vitamin A 2400 MCG (8000 UT) CAPS daily.   vitamin C (ASCORBIC ACID) 500 MG tablet Take 500 mg by mouth 2 (two) times daily.   VITAMIN D  PO Take by mouth daily.   Zinc Citrate-Phytase (ZYTAZE) 25-500 MG CAPS daily.   [DISCONTINUED] atorvastatin  (LIPITOR) 20 MG tablet Take 1 tablet (20 mg total) by mouth daily.   [DISCONTINUED] lisinopril  (ZESTRIL ) 20 MG tablet Take 1 tablet (20 mg total) by mouth daily.       11/09/2023    8:29 AM 05/26/2023    8:40 AM 10/28/2022    8:30 AM 04/29/2022    8:24 AM   GAD 7 : Generalized Anxiety Score  Nervous, Anxious, on Edge 0 0 0 0  Control/stop worrying 0 0 0 0  Worry too much - different things 0 3 0 0  Trouble relaxing 0 0 0 0  Restless 0 0 0 0  Easily annoyed or irritable 0 0 0 0  Afraid - awful might happen 0 0 0 0  Total GAD 7 Score 0 3 0 0  Anxiety Difficulty Not difficult at all Not difficult at all Not difficult at all Not difficult at all       11/09/2023    8:28 AM 05/26/2023    8:40 AM 10/28/2022    8:30 AM  Depression screen PHQ 2/9  Decreased Interest 0 0 0  Down, Depressed, Hopeless 1 0 0  PHQ - 2 Score 1 0 0  Altered sleeping 0 1 0  Tired, decreased energy 1 0 0  Change in appetite 0 0 0  Feeling bad or failure about yourself  0 0 0  Trouble concentrating 0 1 0  Moving slowly or fidgety/restless 0 0 0  Suicidal thoughts 0 0 0  PHQ-9 Score 2 2 0  Difficult doing work/chores Not difficult at all Not difficult at all Not difficult at all    BP Readings from Last 3 Encounters:  11/09/23 116/70  05/26/23 122/78  11/17/22 (!) 144/85    Physical Exam Vitals and nursing note reviewed.  Constitutional:      General: She is not in acute distress.    Appearance: She is well-developed.  HENT:     Head: Normocephalic and atraumatic.  Neck:     Vascular: No carotid bruit.  Cardiovascular:     Rate and Rhythm: Normal rate and regular rhythm.     Heart sounds: No murmur heard. Pulmonary:     Effort: Pulmonary effort is normal. No respiratory distress.     Breath sounds: No wheezing or rhonchi.  Musculoskeletal:     Cervical back: Normal range of motion.     Right lower leg: No edema.     Left lower leg: No edema.  Lymphadenopathy:     Cervical: No cervical adenopathy.  Skin:    General: Skin is warm and dry.     Findings: No rash.  Neurological:     General: No focal deficit present.     Mental Status: She is alert and oriented to person, place, and time.  Psychiatric:        Mood and Affect: Mood normal.         Behavior: Behavior normal.     Wt Readings from Last 3 Encounters:  11/09/23 168 lb 8 oz (76.4 kg)  05/26/23 162 lb (73.5 kg)  10/28/22 161 lb (73 kg)    BP 116/70   Pulse (!) 55   Ht 5\' 4"  (1.626 m)   Wt 168 lb 8 oz (76.4 kg)   SpO2 97%   BMI 28.92 kg/m   Assessment and Plan:  Problem List Items Addressed This Visit       Unprioritized   Dyslipidemia (Chronic)   LDL is  Lab Results  Component Value Date   LDLCALC 90 05/26/2023   Current regimen is atorvastatin .  No medication side effects noted. Goal LDL is <70.       Relevant Medications   atorvastatin  (LIPITOR) 20 MG tablet   Other Relevant Orders   Lipid panel   Essential hypertension - Primary (Chronic)   Blood pressure is well controlled.  Current medications are lisinopril . Will continue same regimen along with efforts to limit dietary sodium.       Relevant Medications   atorvastatin  (LIPITOR) 20 MG tablet   lisinopril  (ZESTRIL ) 20 MG tablet   Other Relevant Orders   CBC with Differential/Platelet   Comprehensive metabolic panel with GFR   Prediabetes (Chronic)   Managed with diet and exercise, Some recent weight gain - will repeat A1C and advise.      Relevant Orders   Hemoglobin A1c    Return in about 6 months (around 05/11/2024) for CPX.    Sheron Dixons, MD [facility] [place]

## 2023-11-10 ENCOUNTER — Ambulatory Visit: Payer: Self-pay | Admitting: Internal Medicine

## 2023-11-10 LAB — COMPREHENSIVE METABOLIC PANEL WITH GFR
ALT: 17 IU/L (ref 0–32)
AST: 19 IU/L (ref 0–40)
Albumin: 4.2 g/dL (ref 3.9–4.9)
Alkaline Phosphatase: 92 IU/L (ref 44–121)
BUN/Creatinine Ratio: 21 (ref 12–28)
BUN: 16 mg/dL (ref 8–27)
Bilirubin Total: <0.2 mg/dL (ref 0.0–1.2)
CO2: 22 mmol/L (ref 20–29)
Calcium: 9.3 mg/dL (ref 8.7–10.3)
Chloride: 101 mmol/L (ref 96–106)
Creatinine, Ser: 0.76 mg/dL (ref 0.57–1.00)
Globulin, Total: 2.4 g/dL (ref 1.5–4.5)
Glucose: 90 mg/dL (ref 70–99)
Potassium: 4.9 mmol/L (ref 3.5–5.2)
Sodium: 140 mmol/L (ref 134–144)
Total Protein: 6.6 g/dL (ref 6.0–8.5)
eGFR: 87 mL/min/{1.73_m2} (ref >59)

## 2023-11-10 LAB — LIPID PANEL
Chol/HDL Ratio: 2.6 ratio (ref 0.0–4.4)
Cholesterol, Total: 180 mg/dL (ref 100–199)
HDL: 68 mg/dL (ref >39)
LDL Chol Calc (NIH): 98 mg/dL (ref 0–99)
Triglycerides: 77 mg/dL (ref 0–149)
VLDL Cholesterol Cal: 14 mg/dL (ref 5–40)

## 2023-11-10 LAB — HEMOGLOBIN A1C
Est. average glucose Bld gHb Est-mCnc: 111 mg/dL
Hgb A1c MFr Bld: 5.5 % (ref 4.8–5.6)

## 2023-11-10 LAB — CBC WITH DIFFERENTIAL/PLATELET
Basophils Absolute: 0.1 10*3/uL (ref 0.0–0.2)
Basos: 2 %
EOS (ABSOLUTE): 0.1 10*3/uL (ref 0.0–0.4)
Eos: 3 %
Hematocrit: 37.9 % (ref 34.0–46.6)
Hemoglobin: 12.7 g/dL (ref 11.1–15.9)
Immature Grans (Abs): 0 10*3/uL (ref 0.0–0.1)
Immature Granulocytes: 0 %
Lymphocytes Absolute: 1.7 10*3/uL (ref 0.7–3.1)
Lymphs: 38 %
MCH: 29.5 pg (ref 26.6–33.0)
MCHC: 33.5 g/dL (ref 31.5–35.7)
MCV: 88 fL (ref 79–97)
Monocytes Absolute: 0.4 10*3/uL (ref 0.1–0.9)
Monocytes: 9 %
Neutrophils Absolute: 2.2 10*3/uL (ref 1.4–7.0)
Neutrophils: 48 %
Platelets: 284 10*3/uL (ref 150–450)
RBC: 4.31 x10E6/uL (ref 3.77–5.28)
RDW: 12.6 % (ref 11.7–15.4)
WBC: 4.4 10*3/uL (ref 3.4–10.8)

## 2023-12-01 ENCOUNTER — Other Ambulatory Visit: Payer: Self-pay | Admitting: Internal Medicine

## 2023-12-01 DIAGNOSIS — J3089 Other allergic rhinitis: Secondary | ICD-10-CM

## 2023-12-01 NOTE — Telephone Encounter (Signed)
 Requested medication (s) are due for refill today: yes  Requested medication (s) are on the active medication list: yes  Last refill:  11/09/23  Future visit scheduled: yes  Notes to clinic:  Unable to refill per protocol, last refill by another provider. Routing to PCP for approval.     Requested Prescriptions  Pending Prescriptions Disp Refills   meloxicam  (MOBIC ) 7.5 MG tablet [Pharmacy Med Name: MELOXICAM  7.5MG  TABLET] 60 tablet 1    Sig: TAKE ONE (1) TABLET BY MOUTH EVERY MORNING AND ONE (1) TABLET BY MOUTH IN THE EVENING. (TAKE WITH MEALS)     Analgesics:  COX2 Inhibitors Failed - 12/01/2023  1:03 PM      Failed - Manual Review: Labs are only required if the patient has taken medication for more than 8 weeks.      Passed - HGB in normal range and within 360 days    Hemoglobin  Date Value Ref Range Status  11/09/2023 12.7 11.1 - 15.9 g/dL Final         Passed - Cr in normal range and within 360 days    Creatinine, Ser  Date Value Ref Range Status  11/09/2023 0.76 0.57 - 1.00 mg/dL Final         Passed - HCT in normal range and within 360 days    Hematocrit  Date Value Ref Range Status  11/09/2023 37.9 34.0 - 46.6 % Final         Passed - AST in normal range and within 360 days    AST  Date Value Ref Range Status  11/09/2023 19 0 - 40 IU/L Final         Passed - ALT in normal range and within 360 days    ALT  Date Value Ref Range Status  11/09/2023 17 0 - 32 IU/L Final         Passed - eGFR is 30 or above and within 360 days    GFR calc Af Amer  Date Value Ref Range Status  10/24/2019 80 >59 mL/min/1.73 Final    Comment:    **[facility] currently reports eGFR in compliance with the current**   recommendations of the [employer]. [facility] will   update reporting as new guidelines are published from the NKF-ASN   Task force.    GFR, Estimated  Date Value Ref Range Status  05/17/2020 >60 >60 mL/min Final    Comment:    (NOTE) Calculated  using the CKD-EPI Creatinine Equation (2021)    eGFR  Date Value Ref Range Status  11/09/2023 87 >59 mL/min/1.73 Final         Passed - Patient is not pregnant      Passed - Valid encounter within last 12 months    Recent Outpatient Visits           3 weeks ago Essential hypertension   [place] [facility] at [facility], Chales [place], MD       Future Appointments             In 6 months Gala Jubilee [place], MD [facility] at [facility], [facility]             fluticasone  (FLONASE ) 50 MCG/ACT nasal spray [Pharmacy Med Name: FLUTICASONE  PROPIONATE SUSPENSION]  5    Sig: PLACE TWO (2) SPRAYS INTO BOTH NOSTRILS EVERY DAY     Ear, Nose, and Throat: Nasal Preparations - Corticosteroids  Passed - 12/01/2023  1:03 PM      Passed - Valid encounter within last 12 months    Recent Outpatient Visits           3 weeks ago Essential hypertension    Primary Care & Sports Medicine at [facility], Chales [place], MD       Future Appointments             In 6 months Gala Jubilee, Chales [place], MD [facility] Primary Care & [facility] Medicine at [facility], [facility]

## 2023-12-01 NOTE — Telephone Encounter (Signed)
Please review medication refill  request

## 2024-01-03 ENCOUNTER — Encounter: Payer: Self-pay | Admitting: Internal Medicine

## 2024-01-03 ENCOUNTER — Other Ambulatory Visit: Payer: Self-pay | Admitting: Internal Medicine

## 2024-01-04 ENCOUNTER — Ambulatory Visit: Admitting: Internal Medicine

## 2024-01-04 ENCOUNTER — Encounter: Payer: Self-pay | Admitting: Internal Medicine

## 2024-01-04 VITALS — BP 124/70 | HR 53 | Ht 64.0 in | Wt 169.0 lb

## 2024-01-04 DIAGNOSIS — H6993 Unspecified Eustachian tube disorder, bilateral: Secondary | ICD-10-CM | POA: Diagnosis not present

## 2024-01-04 DIAGNOSIS — H9313 Tinnitus, bilateral: Secondary | ICD-10-CM

## 2024-01-04 NOTE — Patient Instructions (Signed)
 Use fluticasone  spray daily  Take Sudafed 30 mg twice a day for 5-7 days  See Dr. Juengel as planned - referral sent

## 2024-01-04 NOTE — Progress Notes (Signed)
 Date:  01/04/2024   Name:  Holly Villegas   DOB:  04-21-1961   MRN:  [id number]   Chief Complaint: Ear Fullness (Bilateral ear fullness X 2 weeks. Had water in her ear around July 4th. Tried to flush out her ear with an infant syringe. Both ears feels like they have fluid in them, and ringing in both ears on and off. )  Ear Fullness  There is pain in both ears. This is a new problem. The current episode started in the past 7 days. The patient is experiencing no pain. Pertinent negatives include no coughing, diarrhea, headaches, hearing loss or rhinorrhea. She has tried ear drops for the symptoms. The treatment provided mild relief.    Review of Systems  Constitutional:  Negative for chills, fatigue and fever.  HENT:  Positive for tinnitus. Negative for hearing loss and rhinorrhea.   Respiratory:  Negative for cough, chest tightness and shortness of breath.   Cardiovascular:  Negative for chest pain.  Gastrointestinal:  Negative for diarrhea.  Neurological:  Negative for dizziness, light-headedness and headaches.     Lab Results  Component Value Date   NA 140 11/09/2023   K 4.9 11/09/2023   CO2 22 11/09/2023   GLUCOSE 90 11/09/2023   BUN 16 11/09/2023   CREATININE 0.76 11/09/2023   CALCIUM  9.3 11/09/2023   EGFR 87 11/09/2023   GFRNONAA >60 05/17/2020   Lab Results  Component Value Date   CHOL 180 11/09/2023   HDL 68 11/09/2023   LDLCALC 98 11/09/2023   TRIG 77 11/09/2023   CHOLHDL 2.6 11/09/2023   Lab Results  Component Value Date   TSH 2.810 05/26/2023   Lab Results  Component Value Date   HGBA1C 5.5 11/09/2023   Lab Results  Component Value Date   WBC 4.4 11/09/2023   HGB 12.7 11/09/2023   HCT 37.9 11/09/2023   MCV 88 11/09/2023   PLT 284 11/09/2023   Lab Results  Component Value Date   ALT 17 11/09/2023   AST 19 11/09/2023   ALKPHOS 92 11/09/2023   BILITOT <0.2 11/09/2023   Lab Results  Component Value Date   VD25OH 65.7 12/27/2017      Patient Active Problem List   Diagnosis Date Noted   Onychomycosis 05/26/2023   Prediabetes 10/28/2022   Gastroesophageal reflux disease without esophagitis 04/29/2022   PVC's (premature ventricular contractions) 10/27/2021   Aortic atherosclerosis (HCC) 01/19/2021   Migraine with aura and without status migrainosus, not intractable 07/09/2019   Lymphedema 02/03/2017   Lumbar degenerative disc disease 06/16/2016   Bilateral sciatica 06/11/2016   History of left breast cancer 03/26/2016   Vitamin D  deficiency 11/29/2014   Dupuytren's contracture of foot 11/29/2014   Dyslipidemia 11/28/2014   Environmental and seasonal allergies 11/28/2014   Raynaud's syndrome without gangrene 11/28/2014   Carpal tunnel syndrome 11/28/2014   Benign colonic polyp 11/28/2014   Diverticulosis of colon without diverticulitis 11/28/2014   Essential hypertension 11/28/2014   Varicose veins of both lower extremities with pain 11/28/2014    Allergies  Allergen Reactions   Gluten Meal     Other reaction(s): Abdominal Pain   Shellfish Allergy     Other reaction(s): Vomiting   Codeine Sulfate Nausea And Vomiting   Shellfish-Derived Products    Sulfa Antibiotics Nausea And Vomiting   Brassica Oleracea Nausea And Vomiting and Other (See Comments)    Does not digest and vomits up   Ceftin  [Cefuroxime ] Diarrhea    Nausea and  diarrhea    Past Surgical History:  Procedure Laterality Date   BREAST BIOPSY Left 02/24/2016   path pending   CHOLECYSTECTOMY  2012   COLONOSCOPY WITH PROPOFOL  N/A 10/13/2015   Procedure: COLONOSCOPY WITH PROPOFOL ;  Surgeon: Deward CINDERELLA [place], MD;  Location: [facility] ENDOSCOPY;  Service: Gastroenterology;  Laterality: N/A;   ESOPHAGEAL DILATION  07/2009   THROAT SURGERY  2011   polyp removed from vocal cord   TUBAL LIGATION  1991    Social History   Tobacco Use   Smoking status: Never   Smokeless tobacco: Never  Vaping Use   Vaping status: Never Used  Substance Use Topics    Alcohol use: Yes    Alcohol/week: 0.0 standard drinks of alcohol    Comment: social   Drug use: No     Medication list has been reviewed and updated.  Current Meds  Medication Sig   acetaminophen (TYLENOL) 500 MG tablet Take 1 tablet by mouth once as needed.   Ascorbic Acid (VITAMIN C PO) Take by mouth daily.   aspirin 81 MG chewable tablet Chew 81 mg by mouth daily.   atorvastatin  (LIPITOR) 20 MG tablet Take 1 tablet (20 mg total) by mouth daily.   azelastine  (ASTELIN ) 0.1 % nasal spray PLACE 2 SPRAYS INTO BOTH NOSTRILS TWICE DAILY   Biotin 1 MG CAPS Take by mouth.   Calcium -Magnesium-Vitamin D  (CALCIUM  MAGNESIUM PO) Take by mouth.   cetirizine  (ZYRTEC ) 10 MG tablet TAKE (1) TABLET BY MOUTH EVERY DAY   fluticasone  (FLONASE ) 50 MCG/ACT nasal spray PLACE TWO (2) SPRAYS INTO BOTH NOSTRILS EVERY DAY   Ginkgo Biloba 40 MG TABS Take by mouth.   lisinopril  (ZESTRIL ) 20 MG tablet Take 1 tablet (20 mg total) by mouth daily.   Melatonin 10 MG TABS Take by mouth at bedtime as needed.   meloxicam  (MOBIC ) 7.5 MG tablet Take 7.5 mg by mouth 2 (two) times daily.   Omega-3 Fatty Acids (FISH OIL PO) Take by mouth.   pantoprazole (PROTONIX) 20 MG tablet Take by mouth.   Probiotic Product (PROBIOTIC ADVANCED PO) Take by mouth.   Tavaborole 5 % SOLN Apply 1 Application topically daily.   terbinafine (LAMISIL) 1 % cream Apply 1 Application topically 2 (two) times daily.   UNABLE TO FIND magnesium 250 mg tablet   1 tablet every day by oral route.   Vitamin A 2400 MCG (8000 UT) CAPS daily.   vitamin C (ASCORBIC ACID) 500 MG tablet Take 500 mg by mouth 2 (two) times daily.   VITAMIN D  PO Take by mouth daily.   Zinc Citrate-Phytase (ZYTAZE) 25-500 MG CAPS daily.       01/04/2024    9:04 AM 11/09/2023    8:29 AM 05/26/2023    8:40 AM 10/28/2022    8:30 AM  GAD 7 : Generalized Anxiety Score  Nervous, Anxious, on Edge 0 0 0 0  Control/stop worrying 0 0 0 0  Worry too much - different things 0 0 3 0   Trouble relaxing 0 0 0 0  Restless 0 0 0 0  Easily annoyed or irritable 0 0 0 0  Afraid - awful might happen 0 0 0 0  Total GAD 7 Score 0 0 3 0  Anxiety Difficulty Not difficult at all Not difficult at all Not difficult at all Not difficult at all       01/04/2024    9:04 AM 11/09/2023    8:28 AM 05/26/2023    8:40 AM  Depression screen PHQ 2/9  Decreased Interest 0 0 0  Down, Depressed, Hopeless 0 1 0  PHQ - 2 Score 0 1 0  Altered sleeping 0 0 1  Tired, decreased energy 0 1 0  Change in appetite 0 0 0  Feeling bad or failure about yourself  0 0 0  Trouble concentrating 0 0 1  Moving slowly or fidgety/restless 0 0 0  Suicidal thoughts 0 0 0  PHQ-9 Score 0 2 2  Difficult doing work/chores Not difficult at all Not difficult at all Not difficult at all    BP Readings from Last 3 Encounters:  01/04/24 124/70  11/09/23 116/70  05/26/23 122/78    Physical Exam Vitals and nursing note reviewed.  Constitutional:      General: She is not in acute distress.    Appearance: Normal appearance. She is well-developed.  HENT:     Head: Normocephalic and atraumatic.     Right Ear: Ear canal normal. Tympanic membrane is retracted. Tympanic membrane is not erythematous.     Left Ear: Ear canal normal. Tympanic membrane is retracted. Tympanic membrane is not erythematous.     Nose:     Right Sinus: No maxillary sinus tenderness.     Left Sinus: No maxillary sinus tenderness.     Mouth/Throat:     Pharynx: Oropharynx is clear.  Cardiovascular:     Rate and Rhythm: Normal rate and regular rhythm.  Pulmonary:     Effort: Pulmonary effort is normal. No respiratory distress.     Breath sounds: Normal breath sounds.  Skin:    General: Skin is warm and dry.     Findings: No rash.  Neurological:     Mental Status: She is alert and oriented to person, place, and time.  Psychiatric:        Mood and Affect: Mood normal.        Behavior: Behavior normal.     Wt Readings from Last 3  Encounters:  01/04/24 169 lb (76.7 kg)  11/09/23 168 lb 8 oz (76.4 kg)  05/26/23 162 lb (73.5 kg)    BP 124/70   Pulse (!) 53   Ht 5' 4 (1.626 m)   Wt 169 lb (76.7 kg)   SpO2 98%   BMI 29.01 kg/m   Assessment and Plan:  Problem List Items Addressed This Visit   None Visit Diagnoses       Eustachian tube dysfunction, bilateral    -  Primary   recommend Fluticasone  spray and sudafed bid ref to ENT if not improving   Relevant Orders   Ambulatory referral to ENT     Tinnitus of both ears       Relevant Orders   Ambulatory referral to ENT       No follow-ups on file.    Leita HILARIO Adie, MD [facility] Sports Medicine [place]

## 2024-01-04 NOTE — Telephone Encounter (Signed)
 Appt today- medication not dc'd- will refill Requested Prescriptions  Pending Prescriptions Disp Refills   cetirizine  (ZYRTEC ) 10 MG tablet [Pharmacy Med Name: CETIRIZINE  HYDROCHLORIDE 10MG  TABLET] 30 tablet 5    Sig: TAKE (1) TABLET BY MOUTH EVERY DAY     Ear, Nose, and Throat:  Antihistamines 2 Passed - 01/04/2024  2:12 PM      Passed - Cr in normal range and within 360 days    Creatinine, Ser  Date Value Ref Range Status  11/09/2023 0.76 0.57 - 1.00 mg/dL Final         Passed - Valid encounter within last 12 months    Recent Outpatient Visits           Today Eustachian tube dysfunction, bilateral   [place] [facility] at [facility], Leita DEL, MD   1 month ago Essential hypertension   [place] [facility] Medicine at [facility], Leita DEL, MD       Future Appointments             In 4 months Justus, Leita DEL, MD [facility] Primary Care & [facility] Medicine at [facility], [facility]

## 2024-02-14 ENCOUNTER — Other Ambulatory Visit: Payer: Self-pay | Admitting: Internal Medicine

## 2024-02-15 NOTE — Telephone Encounter (Signed)
 Requested medication (s) are due for refill today: Yes  Requested medication (s) are on the active medication list: Yes  Last refill:  10/10/21  Future visit scheduled: Yes  Notes to clinic:  Historical provider.    Requested Prescriptions  Pending Prescriptions Disp Refills   GNP ADULT ASPIRIN LOW STRENGTH 81 MG chewable tablet [Pharmacy Med Name: [facility] ADULT ASPIRIN LOW STRENGTH 81MG  TABLET CHEWABLE] 90 tablet 3    Sig: TAKE (1) TABLET BY MOUTH EVERY DAY     Analgesics:  NSAIDS - aspirin Passed - 02/15/2024  3:44 PM      Passed - Cr in normal range and within 360 days    Creatinine, Ser  Date Value Ref Range Status  11/09/2023 0.76 0.57 - 1.00 mg/dL Final         Passed - eGFR is 10 or above and within 360 days    GFR calc Af Amer  Date Value Ref Range Status  10/24/2019 80 >59 mL/min/1.73 Final    Comment:    **[facility] currently reports eGFR in compliance with the current**   recommendations of the [employer]. [facility] will   update reporting as new guidelines are published from the NKF-ASN   Task force.    GFR, Estimated  Date Value Ref Range Status  05/17/2020 >60 >60 mL/min Final    Comment:    (NOTE) Calculated using the CKD-EPI Creatinine Equation (2021)    eGFR  Date Value Ref Range Status  11/09/2023 87 >59 mL/min/1.73 Final         Passed - Patient is not pregnant      Passed - Valid encounter within last 12 months    Recent Outpatient Visits           1 month ago Eustachian tube dysfunction, bilateral   [place] [facility] at [facility], Leita DEL, MD   3 months ago Essential hypertension   [place] [facility] Medicine at [facility], Leita DEL, MD       Future Appointments             In 3 months Justus, Leita DEL, MD [facility] Primary Care & Sports Medicine at [facility], [phone] [place]

## 2024-02-16 NOTE — Telephone Encounter (Signed)
 Please review.  KP

## 2024-02-23 DIAGNOSIS — M5126 Other intervertebral disc displacement, lumbar region: Secondary | ICD-10-CM | POA: Diagnosis not present

## 2024-02-23 DIAGNOSIS — M9902 Segmental and somatic dysfunction of thoracic region: Secondary | ICD-10-CM | POA: Diagnosis not present

## 2024-02-23 DIAGNOSIS — M9903 Segmental and somatic dysfunction of lumbar region: Secondary | ICD-10-CM | POA: Diagnosis not present

## 2024-02-23 DIAGNOSIS — M9904 Segmental and somatic dysfunction of sacral region: Secondary | ICD-10-CM | POA: Diagnosis not present

## 2024-02-23 DIAGNOSIS — M5383 Other specified dorsopathies, cervicothoracic region: Secondary | ICD-10-CM | POA: Diagnosis not present

## 2024-02-23 DIAGNOSIS — M9901 Segmental and somatic dysfunction of cervical region: Secondary | ICD-10-CM | POA: Diagnosis not present

## 2024-02-24 ENCOUNTER — Ambulatory Visit
Admission: EM | Admit: 2024-02-24 | Discharge: 2024-02-24 | Disposition: A | Discharge status: Home / Self Care | Attending: Emergency Medicine | Admitting: Emergency Medicine

## 2024-02-24 ENCOUNTER — Encounter: Payer: Self-pay | Admitting: Emergency Medicine

## 2024-02-24 DIAGNOSIS — J069 Acute upper respiratory infection, unspecified: Secondary | ICD-10-CM | POA: Insufficient documentation

## 2024-02-24 LAB — RESP PANEL BY RT-PCR (FLU A&B, COVID) ARPGX2
Influenza A by PCR: NEGATIVE
Influenza B by PCR: NEGATIVE
SARS Coronavirus 2 by RT PCR: NEGATIVE

## 2024-02-24 LAB — GROUP A STREP BY PCR: Group A Strep by PCR: NOT DETECTED

## 2024-02-24 MED ORDER — IPRATROPIUM BROMIDE 0.06 % NA SOLN: 2.0000 | Freq: Four times a day (QID) | NASAL | 12 refills | Status: AC

## 2024-02-24 NOTE — ED Provider Notes (Signed)
 [facility]    CSN: [id number] Arrival date & time: 02/24/24  1639      History   Chief Complaint Chief Complaint  Patient presents with   Headache    HPI Holly Villegas is a 63 y.o. female.   HPI  63 year old female with past medical history significant for left breast cancer, diverticulosis, GERD, heart murmur, high cholesterol, hypertension, and plantar fasciitis presents for evaluation of sore throat.  She reports that she was exposed with a sore throat 5 days ago when she was taking care of her grandchildren due to the fact that her daughter was also sick with strep.  The sore throat developed 2 days ago and then today she developed a raspy voice.  She has also had some mild runny nose, nasal congestion, and postnasal drip.  Slight headache and fatigue.  No cough.  Past Medical History:  Diagnosis Date   Allergy    Cancer (HCC)    left sided breast ca   Diverticulosis    GERD (gastroesophageal reflux disease)    Heart murmur    Hypercholesteremia    Hypertension    Left wrist tendonitis 06/09/2015   Plantar fasciitis, bilateral 11/29/2014    Patient Active Problem List   Diagnosis Date Noted   Onychomycosis 05/26/2023   Prediabetes 10/28/2022   Gastroesophageal reflux disease without esophagitis 04/29/2022   PVC's (premature ventricular contractions) 10/27/2021   Aortic atherosclerosis (HCC) 01/19/2021   Migraine with aura and without status migrainosus, not intractable 07/09/2019   Lymphedema 02/03/2017   Lumbar degenerative disc disease 06/16/2016   Bilateral sciatica 06/11/2016   History of left breast cancer 03/26/2016   Vitamin D  deficiency 11/29/2014   Dupuytren's contracture of foot 11/29/2014   Dyslipidemia 11/28/2014   Environmental and seasonal allergies 11/28/2014   Raynaud's syndrome without gangrene 11/28/2014   Carpal tunnel syndrome 11/28/2014   Benign colonic polyp 11/28/2014   Diverticulosis of colon without  diverticulitis 11/28/2014   Essential hypertension 11/28/2014   Varicose veins of both lower extremities with pain 11/28/2014    Past Surgical History:  Procedure Laterality Date   BREAST BIOPSY Left 02/24/2016   path pending   CHOLECYSTECTOMY  2012   COLONOSCOPY WITH PROPOFOL  N/A 10/13/2015   Procedure: COLONOSCOPY WITH PROPOFOL ;  Surgeon: Deward CINDERELLA Piedmont, MD;  Location: [facility] ENDOSCOPY;  Service: Gastroenterology;  Laterality: N/A;   ESOPHAGEAL DILATION  07/2009   THROAT SURGERY  2011   polyp removed from vocal cord   TUBAL LIGATION  1991    OB History   No obstetric history on file.      Home Medications    Prior to Admission medications   Medication Sig Start Date End Date Taking? Authorizing Provider  acetaminophen (TYLENOL) 500 MG tablet Take 1 tablet by mouth once as needed.   Yes [provider]  Ascorbic Acid (VITAMIN C PO) Take by mouth daily.   Yes [provider]  azelastine  (ASTELIN ) 0.1 % nasal spray PLACE 2 SPRAYS INTO BOTH NOSTRILS TWICE DAILY 10/28/22  Yes Justus Leita DEL, MD  Biotin 1 MG CAPS Take by mouth.   Yes [provider]  Calcium -Magnesium-Vitamin D  (CALCIUM  MAGNESIUM PO) Take by mouth.   Yes [provider]  cetirizine  (ZYRTEC ) 10 MG tablet TAKE (1) TABLET BY MOUTH EVERY DAY 01/04/24  Yes Berglund, Laura H, MD  fluticasone  (FLONASE ) 50 MCG/ACT nasal spray PLACE TWO (2) SPRAYS INTO BOTH NOSTRILS EVERY DAY 12/01/23  Yes Justus Leita DEL, MD  Ginkgo  Biloba 40 MG TABS Take by mouth.   Yes [provider]  GNP ADULT ASPIRIN LOW STRENGTH 81 MG chewable tablet TAKE (1) TABLET BY MOUTH EVERY DAY 02/17/24  Yes Berglund, Laura H, MD  ipratropium (ATROVENT ) 0.06 % nasal spray Place 2 sprays into both nostrils 4 (four) times daily. 02/24/24  Yes Bernardino Ditch, NP  lisinopril  (ZESTRIL ) 20 MG tablet Take 1 tablet (20 mg total) by mouth daily. 11/09/23  Yes Justus Leita DEL, MD  Melatonin 10 MG TABS Take by mouth at bedtime as needed.    Yes [provider]  meloxicam  (MOBIC ) 7.5 MG tablet Take 7.5 mg by mouth 2 (two) times daily.   Yes [provider]  Omega-3 Fatty Acids (FISH OIL PO) Take by mouth.   Yes [provider]  pantoprazole (PROTONIX) 20 MG tablet Take by mouth. 07/31/21  Yes [provider]  Probiotic Product (PROBIOTIC ADVANCED PO) Take by mouth.   Yes [provider]  Tavaborole 5 % SOLN Apply 1 Application topically daily. 10/26/23  Yes [provider]  terbinafine (LAMISIL) 1 % cream Apply 1 Application topically 2 (two) times daily.   Yes [provider]  UNABLE TO FIND magnesium 250 mg tablet   1 tablet every day by oral route.   Yes [provider]  Vitamin A 2400 MCG (8000 UT) CAPS daily.   Yes [provider]  vitamin C (ASCORBIC ACID) 500 MG tablet Take 500 mg by mouth 2 (two) times daily.   Yes [provider]  VITAMIN D  PO Take by mouth daily.   Yes [provider]  Zinc Citrate-Phytase (ZYTAZE) 25-500 MG CAPS daily.   Yes [provider]    Family History Family History  Problem Relation Age of Onset   Hypertension Mother    Migraines Mother    Peptic Ulcer Disease Father    Breast cancer Maternal Aunt 4    Social History Social History   Tobacco Use   Smoking status: Never   Smokeless tobacco: Never  Vaping Use   Vaping status: Never Used  Substance Use Topics   Alcohol use: Yes    Alcohol/week: 0.0 standard drinks of alcohol    Comment: social   Drug use: No     Allergies   Gluten meal, Shellfish allergy, Codeine sulfate, Shellfish-derived products, Sulfa antibiotics, Brassica oleracea, and Ceftin  [cefuroxime ]   Review of Systems Review of Systems  Constitutional:  Positive for diaphoresis and fatigue. Negative for fever.  HENT:  Positive for congestion, rhinorrhea and sore throat. Negative for ear pain.   Respiratory:  Negative for cough.   Neurological:  Positive  for headaches.     Physical Exam Triage Vital Signs ED Triage Vitals  Encounter Vitals Group     BP      Girls Systolic BP Percentile      Girls Diastolic BP Percentile      Boys Systolic BP Percentile      Boys Diastolic BP Percentile      Pulse      Resp      Temp      Temp src      SpO2      Weight      Height      Head Circumference      Peak Flow      Pain Score      Pain Loc      Pain Education      Exclude from  Growth Chart    No data found.  Updated Vital Signs BP (!) 157/88 (BP Location: Left Arm)   Pulse (!) 59   Temp 98.7 F (37.1 C) (Oral)   Wt 172 lb 12.8 oz (78.4 kg)   SpO2 98%   BMI 29.66 kg/m   Visual Acuity Right Eye Distance:   Left Eye Distance:   Bilateral Distance:    Right Eye Near:   Left Eye Near:    Bilateral Near:     Physical Exam Vitals and nursing note reviewed.  Constitutional:      Appearance: Normal appearance. She is not ill-appearing.  HENT:     Head: Normocephalic and atraumatic.     Right Ear: Tympanic membrane, ear canal and external ear normal. There is no impacted cerumen.     Left Ear: Tympanic membrane, ear canal and external ear normal. There is no impacted cerumen.     Nose: Congestion and rhinorrhea present.     Comments: Please mucosa is edematous and erythematous with scant clear discharge in both nares.    Mouth/Throat:     Mouth: Mucous membranes are moist.     Pharynx: Oropharynx is clear. Posterior oropharyngeal erythema present. No oropharyngeal exudate.     Comments: Tonsillar pillars are erythematous and 1+ edematous but free of exudate.  Posterior oropharynx also demonstrates erythema. Cardiovascular:     Rate and Rhythm: Normal rate and regular rhythm.     Pulses: Normal pulses.     Heart sounds: Normal heart sounds. No murmur heard.    No friction rub. No gallop.  Pulmonary:     Effort: Pulmonary effort is normal.     Breath sounds: Normal breath sounds. No wheezing, rhonchi or rales.   Musculoskeletal:     Cervical back: Normal range of motion and neck supple. No tenderness.  Lymphadenopathy:     Cervical: No cervical adenopathy.  Skin:    General: Skin is warm and dry.     Capillary Refill: Capillary refill takes less than 2 seconds.     Findings: No rash.  Neurological:     General: No focal deficit present.     Mental Status: She is alert and oriented to person, place, and time.      UC Treatments / Results  Labs (all labs ordered are listed, but only abnormal results are displayed) Labs Reviewed  GROUP A STREP BY PCR  RESP PANEL BY RT-PCR (FLU A&B, COVID) [id number]    EKG   Radiology No results found.  Procedures Procedures (including critical care time)  Medications Ordered in UC Medications - No data to display  Initial Impression / Assessment and Plan / UC Course  I have reviewed the triage vital signs and the nursing notes.  Pertinent labs & imaging results that were available during my care of the patient were reviewed by me and considered in my medical decision making (see chart for details).   Patient is a nontoxic-appearing 63 year old female presenting for evaluation of sore throat and raspy voice as outlined in HPI above.  Her primary concern is that she may have strep because her grandchildren and daughter all had strep and she took care of them over the weekend.  Her symptoms began 2 days ago.  She denies any measured fever but reports that she has had some night sweats at home.  She also feels significant fatigue.  She does endorse some mild runny nose and nasal congestion but no cough.  She says she does  clear her throat from time to time.  Differential diagnose include COVID, influenza, strep, viral respiratory illness.  I will order a COVID and influenza PCR and a strep PCR.  Strep PCR is negative.  Rester panel is negative for COVID and influenza.  I will discharge patient home with a diagnosis of viral URI.  I will prescribe  Atrovent  nasal spray, nasal congestion postnasal drip.  She can use over-the-counter Tylenol and/or ibuprofen as needed for pain.  For her throat she may gargle with salt water or use over-the-counter Chloraseptic or Sucrets lozenges.  Return precautions reviewed.   Final Clinical Impressions(s) / UC Diagnoses   Final diagnoses:  Viral upper respiratory tract infection     Discharge Instructions      Your workup today was negative for strep, COVID, or influenza.  Your exam is consistent with a viral respiratory illness.  Please use over-the-counter Tylenol and/or ibuprofen as needed for pain.  Use the Atrovent  nasal spray, 2 squirts up each nostril every 6 hours as needed for runny nose and nasal congestion.  Hold your azelastine  nasal spray while you are using the splint.  To soothe your throat you may gargle with warm salt water as often as you would like.  Mix 1 tablespoon of table salt in 8 ounces of warm water, gargle and spit.  You may also use over-the-counter Chloraseptic or Sucrets lozenges.  No more than 1 lozenge every 2 hours as the menthol may give you diarrhea.  If you develop any new or worsening symptoms either return for reevaluation or follow-up with your primary care provider     ED Prescriptions     Medication Sig Dispense Auth. Provider   ipratropium (ATROVENT ) 0.06 % nasal spray Place 2 sprays into both nostrils 4 (four) times daily. 15 mL Bernardino Ditch, NP      PDMP not reviewed this encounter.   Bernardino Ditch, NP 02/24/24 1757

## 2024-02-24 NOTE — ED Triage Notes (Signed)
 Pt c/o sore throat  Pt states that she was exposed to strep throat 5 days ago and she is now having a raspy voice.  Pt took a home covid test and it was negative.

## 2024-02-24 NOTE — Discharge Instructions (Addendum)
 Your workup today was negative for strep, COVID, or influenza.  Your exam is consistent with a viral respiratory illness.  Please use over-the-counter Tylenol and/or ibuprofen as needed for pain.  Use the Atrovent  nasal spray, 2 squirts up each nostril every 6 hours as needed for runny nose and nasal congestion.  Hold your azelastine  nasal spray while you are using the splint.  To soothe your throat you may gargle with warm salt water as often as you would like.  Mix 1 tablespoon of table salt in 8 ounces of warm water, gargle and spit.  You may also use over-the-counter Chloraseptic or Sucrets lozenges.  No more than 1 lozenge every 2 hours as the menthol may give you diarrhea.  If you develop any new or worsening symptoms either return for reevaluation or follow-up with your primary care provider

## 2024-04-23 DIAGNOSIS — M9901 Segmental and somatic dysfunction of cervical region: Secondary | ICD-10-CM | POA: Diagnosis not present

## 2024-04-23 DIAGNOSIS — M9904 Segmental and somatic dysfunction of sacral region: Secondary | ICD-10-CM | POA: Diagnosis not present

## 2024-04-23 DIAGNOSIS — M5383 Other specified dorsopathies, cervicothoracic region: Secondary | ICD-10-CM | POA: Diagnosis not present

## 2024-04-23 DIAGNOSIS — M9902 Segmental and somatic dysfunction of thoracic region: Secondary | ICD-10-CM | POA: Diagnosis not present

## 2024-04-23 DIAGNOSIS — M5126 Other intervertebral disc displacement, lumbar region: Secondary | ICD-10-CM | POA: Diagnosis not present

## 2024-04-23 DIAGNOSIS — M9903 Segmental and somatic dysfunction of lumbar region: Secondary | ICD-10-CM | POA: Diagnosis not present

## 2024-05-29 ENCOUNTER — Encounter: Admitting: Internal Medicine

## 2024-06-08 ENCOUNTER — Other Ambulatory Visit: Payer: Self-pay | Admitting: Medical Genetics

## 2024-06-12 ENCOUNTER — Encounter: Admitting: Internal Medicine

## 2024-06-20 ENCOUNTER — Other Ambulatory Visit

## 2024-06-25 ENCOUNTER — Other Ambulatory Visit
Admission: RE | Admit: 2024-06-25 | Discharge: 2024-06-25 | Disposition: A | Discharge status: Home / Self Care | Payer: Self-pay | Source: Ambulatory Visit | Attending: Medical Genetics | Admitting: Medical Genetics

## 2024-07-03 LAB — GENECONNECT MOLECULAR SCREEN: Genetic Analysis Overall Interpretation: NEGATIVE
